# Patient Record
Sex: Male | Born: 1940 | Race: White | Hispanic: No | Marital: Married | Smoking: Never smoker
Health system: Southern US, Community
[De-identification: ages and names within clinical notes are randomized; demographics above are authoritative.]

## PROBLEM LIST (undated history)

## (undated) DIAGNOSIS — K219 Gastro-esophageal reflux disease without esophagitis: Secondary | ICD-10-CM

## (undated) DIAGNOSIS — I10 Essential (primary) hypertension: Secondary | ICD-10-CM

## (undated) DIAGNOSIS — D649 Anemia, unspecified: Secondary | ICD-10-CM

## (undated) DIAGNOSIS — M810 Age-related osteoporosis without current pathological fracture: Secondary | ICD-10-CM

## (undated) DIAGNOSIS — M1990 Unspecified osteoarthritis, unspecified site: Secondary | ICD-10-CM

## (undated) DIAGNOSIS — E559 Vitamin D deficiency, unspecified: Secondary | ICD-10-CM

## (undated) DIAGNOSIS — J45909 Unspecified asthma, uncomplicated: Secondary | ICD-10-CM

## (undated) DIAGNOSIS — M8580 Other specified disorders of bone density and structure, unspecified site: Secondary | ICD-10-CM

## (undated) HISTORY — PX: INGUINAL HERNIA REPAIR: SUR1180

## (undated) HISTORY — PX: HYDROCELE EXCISION: SHX482

---

## 2003-12-24 ENCOUNTER — Encounter: Payer: Self-pay | Admitting: Internal Medicine

## 2004-01-02 ENCOUNTER — Encounter: Payer: Self-pay | Admitting: Internal Medicine

## 2004-05-03 ENCOUNTER — Encounter: Payer: Self-pay | Admitting: Internal Medicine

## 2004-06-01 ENCOUNTER — Encounter: Payer: Self-pay | Admitting: Internal Medicine

## 2004-08-03 ENCOUNTER — Encounter: Payer: Self-pay | Admitting: Internal Medicine

## 2004-09-01 ENCOUNTER — Encounter: Payer: Self-pay | Admitting: Internal Medicine

## 2004-12-22 ENCOUNTER — Encounter: Payer: Self-pay | Admitting: Internal Medicine

## 2005-01-01 ENCOUNTER — Encounter: Payer: Self-pay | Admitting: Internal Medicine

## 2005-04-27 ENCOUNTER — Other Ambulatory Visit: Payer: Self-pay

## 2005-04-27 ENCOUNTER — Emergency Department: Payer: Self-pay | Admitting: Emergency Medicine

## 2005-08-02 ENCOUNTER — Encounter: Payer: Self-pay | Admitting: Internal Medicine

## 2005-09-01 ENCOUNTER — Encounter: Payer: Self-pay | Admitting: Internal Medicine

## 2006-04-19 ENCOUNTER — Encounter: Payer: Self-pay | Admitting: Internal Medicine

## 2006-05-02 ENCOUNTER — Encounter: Payer: Self-pay | Admitting: Internal Medicine

## 2007-06-05 ENCOUNTER — Encounter: Payer: Self-pay | Admitting: Internal Medicine

## 2007-07-02 ENCOUNTER — Encounter: Payer: Self-pay | Admitting: Internal Medicine

## 2007-12-22 ENCOUNTER — Encounter: Payer: Self-pay | Admitting: Internal Medicine

## 2008-01-02 ENCOUNTER — Encounter: Payer: Self-pay | Admitting: Internal Medicine

## 2008-06-04 ENCOUNTER — Ambulatory Visit: Payer: Self-pay | Admitting: Internal Medicine

## 2008-12-22 ENCOUNTER — Encounter: Payer: Self-pay | Admitting: Internal Medicine

## 2009-01-01 ENCOUNTER — Encounter: Payer: Self-pay | Admitting: Internal Medicine

## 2009-05-24 ENCOUNTER — Encounter: Payer: Self-pay | Admitting: Internal Medicine

## 2009-06-01 ENCOUNTER — Encounter: Payer: Self-pay | Admitting: Internal Medicine

## 2009-12-20 ENCOUNTER — Encounter: Payer: Self-pay | Admitting: Internal Medicine

## 2010-01-01 ENCOUNTER — Encounter: Payer: Self-pay | Admitting: Internal Medicine

## 2010-06-14 ENCOUNTER — Ambulatory Visit: Payer: Self-pay | Admitting: Internal Medicine

## 2010-12-12 ENCOUNTER — Ambulatory Visit: Payer: Self-pay | Admitting: Internal Medicine

## 2011-04-16 ENCOUNTER — Encounter: Payer: Self-pay | Admitting: Family Medicine

## 2011-04-16 LAB — FERRITIN: Ferritin (ARMC): 94 ng/mL (ref 8–388)

## 2011-05-02 ENCOUNTER — Encounter: Payer: Self-pay | Admitting: Family Medicine

## 2011-08-16 ENCOUNTER — Encounter: Payer: Self-pay | Admitting: Family Medicine

## 2011-08-16 LAB — HEMATOCRIT: HCT: 44.1 % (ref 40.0–52.0)

## 2011-09-02 ENCOUNTER — Encounter: Payer: Self-pay | Admitting: Family Medicine

## 2012-01-04 ENCOUNTER — Ambulatory Visit: Payer: Self-pay | Admitting: Family Medicine

## 2012-09-05 ENCOUNTER — Other Ambulatory Visit: Payer: Self-pay | Admitting: Family Medicine

## 2012-09-05 LAB — HEMATOCRIT: HCT: 42.2 % (ref 40.0–52.0)

## 2012-10-01 ENCOUNTER — Other Ambulatory Visit: Payer: Self-pay | Admitting: Family Medicine

## 2013-01-12 ENCOUNTER — Ambulatory Visit: Payer: Self-pay | Admitting: Gastroenterology

## 2013-01-12 HISTORY — PX: COLONOSCOPY: SHX174

## 2013-02-06 ENCOUNTER — Encounter: Payer: Self-pay | Admitting: Family Medicine

## 2013-02-06 LAB — HEMOGLOBIN: HGB: 15.1 g/dL (ref 13.0–18.0)

## 2013-03-01 ENCOUNTER — Encounter: Payer: Self-pay | Admitting: Family Medicine

## 2013-10-01 ENCOUNTER — Encounter: Payer: Self-pay | Admitting: Family Medicine

## 2013-10-01 LAB — CBC WITH DIFFERENTIAL/PLATELET
BASOS ABS: 0.1 10*3/uL (ref 0.0–0.1)
Basophil %: 1.1 %
EOS ABS: 0.2 10*3/uL (ref 0.0–0.7)
Eosinophil %: 2.3 %
HCT: 43.4 % (ref 40.0–52.0)
HGB: 15 g/dL (ref 13.0–18.0)
Lymphocyte #: 2 10*3/uL (ref 1.0–3.6)
Lymphocyte %: 29.8 %
MCH: 33.7 pg (ref 26.0–34.0)
MCHC: 34.5 g/dL (ref 32.0–36.0)
MCV: 98 fL (ref 80–100)
MONO ABS: 0.6 x10 3/mm (ref 0.2–1.0)
Monocyte %: 8.5 %
NEUTROS ABS: 4 10*3/uL (ref 1.4–6.5)
Neutrophil %: 58.3 %
PLATELETS: 295 10*3/uL (ref 150–440)
RBC: 4.45 10*6/uL (ref 4.40–5.90)
RDW: 12.5 % (ref 11.5–14.5)
WBC: 6.8 10*3/uL (ref 3.8–10.6)

## 2013-11-01 ENCOUNTER — Encounter: Payer: Self-pay | Admitting: Family Medicine

## 2014-03-15 ENCOUNTER — Encounter
Admit: 2014-03-15 | Disposition: A | Discharge status: Home / Self Care | Payer: Self-pay | Attending: Family Medicine | Admitting: Family Medicine

## 2014-04-02 ENCOUNTER — Encounter
Admit: 2014-04-02 | Disposition: A | Discharge status: Home / Self Care | Payer: Self-pay | Attending: Family Medicine | Admitting: Family Medicine

## 2015-12-01 DIAGNOSIS — Z0000 Encounter for general adult medical examination without abnormal findings: Secondary | ICD-10-CM | POA: Insufficient documentation

## 2015-12-01 DIAGNOSIS — Z7185 Encounter for immunization safety counseling: Secondary | ICD-10-CM | POA: Insufficient documentation

## 2017-09-11 DIAGNOSIS — M8589 Other specified disorders of bone density and structure, multiple sites: Secondary | ICD-10-CM | POA: Insufficient documentation

## 2018-10-14 ENCOUNTER — Other Ambulatory Visit: Payer: Self-pay

## 2018-10-14 DIAGNOSIS — Z20822 Contact with and (suspected) exposure to covid-19: Secondary | ICD-10-CM

## 2018-10-16 LAB — NOVEL CORONAVIRUS, NAA: SARS-CoV-2, NAA: NOT DETECTED

## 2019-02-20 DIAGNOSIS — M47812 Spondylosis without myelopathy or radiculopathy, cervical region: Secondary | ICD-10-CM | POA: Insufficient documentation

## 2019-10-15 ENCOUNTER — Other Ambulatory Visit (HOSPITAL_COMMUNITY): Payer: Self-pay | Admitting: Family Medicine

## 2019-10-15 ENCOUNTER — Other Ambulatory Visit: Payer: Self-pay | Admitting: Family Medicine

## 2019-10-15 DIAGNOSIS — M5442 Lumbago with sciatica, left side: Secondary | ICD-10-CM

## 2019-10-15 DIAGNOSIS — M5441 Lumbago with sciatica, right side: Secondary | ICD-10-CM

## 2019-10-20 ENCOUNTER — Ambulatory Visit
Admission: RE | Admit: 2019-10-20 | Discharge: 2019-10-20 | Disposition: A | Discharge status: Home / Self Care | Payer: Medicare PPO | Source: Ambulatory Visit | Attending: Family Medicine | Admitting: Family Medicine

## 2019-10-20 ENCOUNTER — Other Ambulatory Visit: Payer: Self-pay

## 2019-10-20 DIAGNOSIS — M5441 Lumbago with sciatica, right side: Secondary | ICD-10-CM

## 2019-10-20 DIAGNOSIS — M5442 Lumbago with sciatica, left side: Secondary | ICD-10-CM | POA: Insufficient documentation

## 2019-10-21 DIAGNOSIS — M48062 Spinal stenosis, lumbar region with neurogenic claudication: Secondary | ICD-10-CM | POA: Insufficient documentation

## 2019-10-31 ENCOUNTER — Ambulatory Visit: Payer: Self-pay

## 2019-11-18 ENCOUNTER — Other Ambulatory Visit: Payer: Self-pay | Admitting: Neurosurgery

## 2019-11-18 DIAGNOSIS — G959 Disease of spinal cord, unspecified: Secondary | ICD-10-CM

## 2019-12-03 ENCOUNTER — Other Ambulatory Visit: Payer: Self-pay

## 2019-12-03 ENCOUNTER — Ambulatory Visit
Admission: RE | Admit: 2019-12-03 | Discharge: 2019-12-03 | Disposition: A | Discharge status: Home / Self Care | Payer: Medicare PPO | Source: Ambulatory Visit | Attending: Neurosurgery | Admitting: Neurosurgery

## 2019-12-03 DIAGNOSIS — G959 Disease of spinal cord, unspecified: Secondary | ICD-10-CM | POA: Diagnosis present

## 2019-12-03 MED ORDER — GADOBUTROL 1 MMOL/ML IV SOLN
6.0000 mL | Freq: Once | INTRAVENOUS | Status: AC | PRN
  Administered 2019-12-03: 6 mL via INTRAVENOUS

## 2019-12-20 DIAGNOSIS — G4700 Insomnia, unspecified: Secondary | ICD-10-CM | POA: Insufficient documentation

## 2019-12-20 DIAGNOSIS — N529 Male erectile dysfunction, unspecified: Secondary | ICD-10-CM | POA: Insufficient documentation

## 2019-12-20 DIAGNOSIS — K219 Gastro-esophageal reflux disease without esophagitis: Secondary | ICD-10-CM | POA: Insufficient documentation

## 2019-12-20 DIAGNOSIS — I10 Essential (primary) hypertension: Secondary | ICD-10-CM | POA: Insufficient documentation

## 2019-12-20 DIAGNOSIS — M8580 Other specified disorders of bone density and structure, unspecified site: Secondary | ICD-10-CM | POA: Insufficient documentation

## 2019-12-22 ENCOUNTER — Ambulatory Visit: Payer: Medicare PPO | Attending: Pain Medicine | Admitting: Pain Medicine

## 2019-12-22 ENCOUNTER — Encounter: Payer: Self-pay | Admitting: Pain Medicine

## 2019-12-22 ENCOUNTER — Other Ambulatory Visit: Payer: Self-pay

## 2019-12-22 VITALS — BP 154/73 | HR 96 | Temp 97.7°F | Resp 16 | Ht 64.0 in | Wt 142.0 lb

## 2019-12-22 DIAGNOSIS — Z789 Other specified health status: Secondary | ICD-10-CM | POA: Diagnosis present

## 2019-12-22 DIAGNOSIS — M5137 Other intervertebral disc degeneration, lumbosacral region: Secondary | ICD-10-CM | POA: Insufficient documentation

## 2019-12-22 DIAGNOSIS — M48062 Spinal stenosis, lumbar region with neurogenic claudication: Secondary | ICD-10-CM

## 2019-12-22 DIAGNOSIS — M47816 Spondylosis without myelopathy or radiculopathy, lumbar region: Secondary | ICD-10-CM | POA: Diagnosis present

## 2019-12-22 DIAGNOSIS — M5441 Lumbago with sciatica, right side: Secondary | ICD-10-CM | POA: Insufficient documentation

## 2019-12-22 DIAGNOSIS — M899 Disorder of bone, unspecified: Secondary | ICD-10-CM

## 2019-12-22 DIAGNOSIS — M4310 Spondylolisthesis, site unspecified: Secondary | ICD-10-CM

## 2019-12-22 DIAGNOSIS — M5030 Other cervical disc degeneration, unspecified cervical region: Secondary | ICD-10-CM | POA: Diagnosis present

## 2019-12-22 DIAGNOSIS — R937 Abnormal findings on diagnostic imaging of other parts of musculoskeletal system: Secondary | ICD-10-CM

## 2019-12-22 DIAGNOSIS — S22080S Wedge compression fracture of T11-T12 vertebra, sequela: Secondary | ICD-10-CM

## 2019-12-22 DIAGNOSIS — G894 Chronic pain syndrome: Secondary | ICD-10-CM

## 2019-12-22 DIAGNOSIS — M79604 Pain in right leg: Secondary | ICD-10-CM | POA: Insufficient documentation

## 2019-12-22 DIAGNOSIS — M4807 Spinal stenosis, lumbosacral region: Secondary | ICD-10-CM | POA: Diagnosis present

## 2019-12-22 DIAGNOSIS — F1120 Opioid dependence, uncomplicated: Secondary | ICD-10-CM | POA: Diagnosis present

## 2019-12-22 DIAGNOSIS — M542 Cervicalgia: Secondary | ICD-10-CM

## 2019-12-22 DIAGNOSIS — M47812 Spondylosis without myelopathy or radiculopathy, cervical region: Secondary | ICD-10-CM

## 2019-12-22 DIAGNOSIS — M51379 Other intervertebral disc degeneration, lumbosacral region without mention of lumbar back pain or lower extremity pain: Secondary | ICD-10-CM

## 2019-12-22 DIAGNOSIS — M4316 Spondylolisthesis, lumbar region: Secondary | ICD-10-CM | POA: Diagnosis present

## 2019-12-22 DIAGNOSIS — Z79899 Other long term (current) drug therapy: Secondary | ICD-10-CM

## 2019-12-22 DIAGNOSIS — G8929 Other chronic pain: Secondary | ICD-10-CM

## 2019-12-22 NOTE — Progress Notes (Signed)
Safety precautions to be maintained throughout the outpatient stay will include: orient to surroundings, keep bed in low position, maintain call bell within reach at all times, provide assistance with transfer out of bed and ambulation.  

## 2019-12-22 NOTE — Progress Notes (Deleted)
PROVIDER NOTE: Information contained herein reflects review and annotations entered in association with encounter. Interpretation of such information and data should be left to medically-trained personnel. Information provided to patient can be located elsewhere in the medical record under "Patient Instructions". Document created using STT-dictation technology, any transcriptional errors that may result from process are unintentional.    Patient: Carlos Neal.  Service: Procedure (FAST-TRACK REFERRAL)  Provider: Gaspar Cola, MD  DOB: [birth year]  DOS: 12/22/2019  Specialty: Interventional Pain Management  MRN: [id number]  Setting: Ambulatory outpatient  Referring Provider: Dion Body, MD  Type: New Patient  Location: Pain clinic facility  office  PCP: Dion Body, MD  NOTE: FAST-TRACK referrals are offered to accelerate the care of patients in the need of specific interventional pain management therapies. This type of referral does not include non-interventional pain management options.   ***Procedure  Primary Reason for Visit: Interventional Pain Management Treatment. CC: Back Pain (lower)  HPI  Carlos Neal is a 79 y.o. year old, male patient, who comes today for a  "Fast-Track" new patient evaluation, as requested by Dion Body, MD. The patient has been made aware that this type of referral option is reserved for the Interventional Pain Management portion of our practice and completely excludes the option of medication management. His primarily concern today is the Back Pain (lower)  Pain Assessment: Location: Lower Back Radiating: right upper leg Onset: More than a month ago Duration: Chronic pain Quality: Burning Severity: 10-Worst pain ever/10 (subjective, self-reported pain score)  Effect on ADL: difficulty walking Timing: Intermittent Modifying factors: sitting, heat, Oxycodone BP: (!) 154/73  HR: 96  Onset and Duration: Present longer than 3  months Cause of pain: exercise Severity: No change since onset, NAS-11 at its worse: 10/10, NAS-11 now: 3/10 and NAS-11 on the average: 3/10 Timing: Not influenced by the time of the day Aggravating Factors: Bowel movements, Kneeling, Lifiting, Prolonged standing, Twisting and Walking uphill Alleviating Factors: Hot packs, Medications, Resting, TENS and physical therapy Associated Problems: Constipation, Night-time cramps and Pain that wakes patient up Quality of Pain: Agonizing, Burning, Hot and Sharp Previous Examinations or Tests: MRI scan and X-rays Previous Treatments: Physical Therapy, Relaxation therapy and TENS  The patient comes into the clinics today, referred to [place] for a ***  Meds   Current Outpatient Medications:  .  Acetaminophen (TYLENOL PO), Take 1,000 mg by mouth 3 (three) times daily as needed., Disp: , Rfl:  .  ASPIRIN 81 PO, Take by mouth daily., Disp: , Rfl:  .  brimonidine (ALPHAGAN P) 0.1 % SOLN, 1 drop in the morning and at bedtime., Disp: , Rfl:  .  Calcium Carbonate-Vit D-Min (CALTRATE 600+D PLUS MINERALS) 600-800 MG-UNIT TABS, Take by mouth daily., Disp: , Rfl:  .  diclofenac Sodium (VOLTAREN) 1 % GEL, Apply topically as needed., Disp: , Rfl:  .  gabapentin (NEURONTIN) 300 MG capsule, Take 300 mg by mouth at bedtime., Disp: , Rfl:  .  hydrochlorothiazide (HYDRODIURIL) 25 MG tablet, Take 25 mg by mouth daily., Disp: , Rfl:  .  ibuprofen (ADVIL) 800 MG tablet, Take 800 mg by mouth every 8 (eight) hours as needed., Disp: , Rfl:  .  losartan (COZAAR) 25 MG tablet, Take 25 mg by mouth daily., Disp: , Rfl:  .  Multiple Vitamins-Minerals (PRESERVISION AREDS PO), Take by mouth daily., Disp: , Rfl:  .  naproxen sodium (ALEVE) 220 MG tablet, Take 220 mg by mouth 2 (two) times daily as needed., Disp: ,  Rfl:  .  omeprazole (PRILOSEC) 20 MG capsule, Take 20 mg by mouth daily., Disp: , Rfl:  .  oxyCODONE (ROXICODONE) 5 MG immediate release tablet, Take 5 mg by mouth 2 (two)  times daily., Disp: , Rfl:  .  RIBOFLAVIN PO, Take by mouth daily., Disp: , Rfl:  .  traZODone (DESYREL) 50 MG tablet, Take 50 mg by mouth at bedtime as needed for sleep., Disp: , Rfl:   Imaging Review  Cervical Imaging: Cervical MR wo contrast: No results found for this or any previous visit.  Cervical MR wo contrast: No valid procedures specified. Cervical MR w/wo contrast: No results found for this or any previous visit.  Cervical MR w contrast: No results found for this or any previous visit.  Cervical CT wo contrast: No results found for this or any previous visit.  Cervical CT w/wo contrast: No results found for this or any previous visit.  Cervical CT w/wo contrast: No results found for this or any previous visit.  Cervical CT w contrast: No results found for this or any previous visit.  Cervical CT outside: No results found for this or any previous visit.  Cervical DG 1 view: No results found for this or any previous visit.  Cervical DG 2-3 views: No results found for this or any previous visit.  Cervical DG F/E views: No results found for this or any previous visit.  Cervical DG 2-3 clearing views: No results found for this or any previous visit.  Cervical DG Bending/F/E views: No results found for this or any previous visit.  Cervical DG complete: No results found for this or any previous visit.  Cervical DG Myelogram views: No results found for this or any previous visit.  Cervical DG Myelogram views: No results found for this or any previous visit.  Cervical Discogram views: No results found for this or any previous visit.   Shoulder Imaging: Shoulder-R MR w contrast: No results found for this or any previous visit.  Shoulder-L MR w contrast: No results found for this or any previous visit.  Shoulder-R MR w/wo contrast: No results found for this or any previous visit.  Shoulder-L MR w/wo contrast: No results found for this or any previous  visit.  Shoulder-R MR wo contrast: No results found for this or any previous visit.  Shoulder-L MR wo contrast: No results found for this or any previous visit.  Shoulder-R CT w contrast: No results found for this or any previous visit.  Shoulder-L CT w contrast: No results found for this or any previous visit.  Shoulder-R CT w/wo contrast: No results found for this or any previous visit.  Shoulder-L CT w/wo contrast: No results found for this or any previous visit.  Shoulder-R CT wo contrast: No results found for this or any previous visit.  Shoulder-L CT wo contrast: No results found for this or any previous visit.  Shoulder-R DG Arthrogram: No results found for this or any previous visit.  Shoulder-L DG Arthrogram: No results found for this or any previous visit.  Shoulder-R DG 1 view: No results found for this or any previous visit.  Shoulder-L DG 1 view: No results found for this or any previous visit.  Shoulder-R DG: No results found for this or any previous visit.  Shoulder-L DG: No results found for this or any previous visit.   Thoracic Imaging: Thoracic MR wo contrast: No results found for this or any previous visit.  Thoracic MR wo contrast: No valid procedures  specified. Thoracic MR w/wo contrast: No results found for this or any previous visit.  Thoracic MR w contrast: No results found for this or any previous visit.  Thoracic CT wo contrast: No results found for this or any previous visit.  Thoracic CT w/wo contrast: No results found for this or any previous visit.  Thoracic CT w/wo contrast: No results found for this or any previous visit.  Thoracic CT w contrast: No results found for this or any previous visit.  Thoracic DG 2-3 views: No results found for this or any previous visit.  Thoracic DG 4 views: No results found for this or any previous visit.  Thoracic DG: No results found for this or any previous visit.  Thoracic DG w/swimmers view: No  results found for this or any previous visit.  Thoracic DG Myelogram views: No results found for this or any previous visit.  Thoracic DG Myelogram views: No results found for this or any previous visit.   Lumbosacral Imaging: Lumbar MR wo contrast: Results for orders placed during the hospital encounter of 10/20/19  MR LUMBAR SPINE WO CONTRAST  Narrative CLINICAL DATA:  Acute bilateral low back pain with bilateral sciatica. Additional history provided by technologist: Patient started [employer] 6 months ago, bilateral posterior hip pain.  EXAM: MRI LUMBAR SPINE WITHOUT CONTRAST  TECHNIQUE: Multiplanar, multisequence MR imaging of the lumbar spine was performed. No intravenous contrast was administered.  COMPARISON:  Report from lumbar spine MRI 10/02/2019 (images unavailable).  FINDINGS: Segmentation: For the purposes of this dictation, five lumbar vertebrae are assumed and the caudal most well-formed intervertebral disc is designated L5-S1.  Alignment: S shaped scoliosis. T12-L1 and L1-L2 grade 1 retrolisthesis. L4-L5 grade 1 anterolisthesis.  Vertebrae: Mild chronic anterior wedging of the T12 vertebral body. Vertebral body height is otherwise maintained. Multilevel degenerative endplate irregularity with mixed degenerative endplate marrow signal. Schmorl nodes within the L1 and L3 superior endplates. There is prominent endplate edema at Z6-X0, also extending into the right-sided pedicles at these levels. Additional extensive T2/STIR hyperintense signal abnormality throughout much of the L2 vertebral body. Prominent multilevel ventrolateral osteophytes.  Conus medullaris and cauda equina: Conus extends to the T12-L1 level. No signal abnormality within the visualized distal spinal cord.  Paraspinal and other soft tissues: Atrophy of the left psoas muscle (series 10, image 16). No other abnormality is identified within included portions of the  abdomen/retroperitoneum. Atrophy of the lumbar paraspinal musculature.  Disc levels:  Multilevel disc degeneration most notably as follows. There is advanced disc degeneration on the right at T12-L1, L1-L2 and L2-L3. Moderate/advanced disc degeneration at L4-L5.  T11-T12: This level is imaged sagittally. Disc bulge. Superimposed central disc protrusion. The disc protrusion contributes to mild spinal canal stenosis, contacting and mildly flattening the distal spinal cord. No significant foraminal stenosis.  T12-L1: Grade 1 retrolisthesis. Disc bulge asymmetric to the right. Osteophyte ridge greatest along the right lateral aspect of the disc space. Facet arthrosis. The disc bulge contributes to mild spinal canal stenosis, contacting multiple descending nerve roots within the ventral spinal canal. No significant foraminal stenosis.  L1-L2: Grade 1 retrolisthesis. Disc bulge. Prominent osteophyte ridge along the right ventrolateral aspect of the disc space. Facet arthrosis. No significant spinal canal stenosis. Moderate right neural foraminal narrowing. Additionally, disc osteophyte ridge may contact the exiting right L1 nerve root beyond the right neural foramen.  L2-L3: Large irregular disc bulge with circumferential osteophyte ridge. Advanced facet arthrosis with ligamentum flavum hypertrophy. Severe spinal canal stenosis with likely  impingement of the descending cauda equina nerve roots (series 5, image 9) (series 10, image 14). Bilateral neural foraminal narrowing (moderate right, mild left).  L3-L4: Disc bulge. Moderate/advanced facet arthrosis with ligamentum flavum hypertrophy. Mild bilateral subarticular narrowing without frank nerve root impingement. Central canal patent. Borderline mild right neural foraminal narrowing. Moderate left neural foraminal narrowing.  L4-L5: Grade 1 anterolisthesis. Grade 1 anterolisthesis. Disc uncovering with disc bulge. Osteophyte ridge  greatest along left lateral aspect of the disc space. Advanced facet arthrosis with ligamentum flavum hypertrophy. Mild bilateral subarticular narrowing without nerve root impingement. Central canal patent. Moderate right neural foraminal narrowing. Borderline mild left neural foraminal narrowing.  L5-S1: No significant disc herniation. Moderate/advanced facet arthrosis. No significant spinal canal stenosis. Mild bilateral neural foraminal narrowing.  Impression #1 and #3 will be called to the ordering clinician or representative by the Radiologist Assistant, and communication documented in the PACS or [employer].  IMPRESSION: At L2-L3, there is prominent endplate edema in the presence of severe disc degeneration. There is additional T2/STIR hyperintense signal abnormality throughout much of the L2 vertebral body demonstrating a somewhat rounded appearance. A component of this signal abnormality is almost certainly degenerative. However, the extent and appearance of the signal abnormality within the L2 vertebral body raises the possibility of an underlying lesion (i.e. atypical hemangioma or metastasis). Additionally, clinical correlation is recommended to exclude signs or symptoms of infection. MRI follow-up is recommended for surveillance of these findings.  Lumbar spondylosis as outlined with findings most notably as follows.  At L2-L3, there is severe disc degeneration. Multifactorial severe spinal canal stenosis with likely cauda equina nerve root impingement. Bilateral neural foraminal narrowing (moderate right, mild left).  No more than mild spinal canal stenosis at the remaining levels. Of note, a T11-T12 disc bulge contacts and mildly flattens the ventral spinal cord. A T12-L1, a disc bulge contacts the descending cauda equina nerve roots within the ventral aspect of the spinal canal. 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  Additional levels of facet arthrosis (including levels of advanced facet arthrosis) as above.  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   Electronically Signed By: Kellie Simmering DO On: 10/20/2019 17:06  Lumbar MR wo contrast: No valid procedures specified. Lumbar MR w/wo contrast: Results for orders placed during the hospital encounter of 12/03/19  MR Lumbar Spine W Wo Contrast  Narrative CLINICAL DATA:  Low back pain with pain and burning in the right leg.  EXAM: MRI LUMBAR SPINE WITHOUT AND WITH CONTRAST  TECHNIQUE: Multiplanar and multiecho pulse sequences of the lumbar spine were obtained without and with intravenous contrast.  CONTRAST:  32m GADAVIST GADOBUTROL 1 MMOL/ML IV SOLN  COMPARISON:  10/20/2019  FINDINGS: Segmentation:  Standard lumbar numbering  Alignment: Exaggerated lumbar lordosis with L4-5 anterolisthesis. There is retrolisthesis at T12-L1 and L1-2 exaggerated kyphosis at the thoracolumbar junction.  Vertebrae: Discogenic endplate edema at LQ3-3 no fracture or suspected bone lesion.  Conus medullaris and cauda equina: Conus extends to the T12 level. Conus appears normal. There is nerve root redundancy below L2-3 due to the severity of spinal stenosis.  Paraspinal and other soft tissues: Left far-lateral to L3 is a 14 mm cystic structure with internal debris like appearance, new/progressive from before and contiguous with a left far-lateral extrude.  Disc levels:  T12- L1: Anterior disc narrowing and spurring. Small central disc protrusion. No neural  compression.  L1-L2: Asymmetric rightward disc collapse with endplate spurring. Asymmetric right facet spurring. Right foraminal stenosis is mild or moderate.  L2-L3: Disc collapse and endplate degeneration with discogenic edema. Facet osteoarthritis with asymmetric  left-sided spurring. Severe spinal stenosis. Biforaminal impingement and marked left far-lateral impingement due to a extrusion with adjacent cyst that is also mentioned above.  L3-L4: Disc narrowing and bulging. Facet osteoarthritis and ligamentum flavum thickening with interspinous ganglion. No neural compression.  L4-L5: Facet osteoarthritis with spurring and anterolisthesis. Disc narrowing asymmetric to the left where there is greater bulging and ridging. Moderate left foraminal stenosis. Patent canal  L5-S1:Facet osteoarthritis with moderate spurring. Negative disc space. No neural impingement.  IMPRESSION: 1. Advanced disc and facet degeneration with exaggerated lumbar lordosis and L4-5 anterolisthesis. 2. Dominant findings at L2-3 where there is advanced spinal stenosis and biforaminal impingement. Also at this level is a left far-lateral extrusion with adjacent 14 mm cyst that has developed/progressed from comparison 10/20/2019, presumably disc. Marrow signal abnormality at this level is attributed to discogenic inflammation; no evident bone lesion. 3. L4-5 moderate left foraminal stenosis.   Electronically Signed By: Monte Fantasia M.D. On: 12/04/2019 09:14  Lumbar MR w/wo contrast: No results found for this or any previous visit.  Lumbar MR w contrast: No results found for this or any previous visit.  Lumbar CT wo contrast: No results found for this or any previous visit.  Lumbar CT w/wo contrast: No results found for this or any previous visit.  Lumbar CT w/wo contrast: No results found for this or any previous visit.  Lumbar CT w contrast: No results found for this or any previous visit.  Lumbar DG 1V: No results found for this or any previous visit.  Lumbar DG 1V (Clearing): No results found for this or any previous visit.  Lumbar DG 2-3V (Clearing): No results found for this or any previous visit.  Lumbar DG 2-3 views: No results found for this or any  previous visit.  Lumbar DG (Complete) 4+V: No results found for this or any previous visit.        Lumbar DG F/E views: No results found for this or any previous visit.        Lumbar DG Bending views: No results found for this or any previous visit.        Lumbar DG Myelogram views: No results found for this or any previous visit.  Lumbar DG Myelogram: No results found for this or any previous visit.  Lumbar DG Myelogram: No results found for this or any previous visit.  Lumbar DG Myelogram: No results found for this or any previous visit.  Lumbar DG Myelogram Lumbosacral: No results found for this or any previous visit.  Lumbar DG Diskogram views: No results found for this or any previous visit.  Lumbar DG Diskogram views: No results found for this or any previous visit.  Lumbar DG Epidurogram OP: No results found for this or any previous visit.  Lumbar DG Epidurogram IP: No results found for this or any previous visit.   Sacroiliac Joint Imaging: Sacroiliac Joint DG: No results found for this or any previous visit.  Sacroiliac Joint MR w/wo contrast: No results found for this or any previous visit.  Sacroiliac Joint MR wo contrast: No results found for this or any previous visit.   Spine Imaging: Whole Spine DG Myelogram views: No results found for this or any previous visit.  Whole Spine MR Mets screen: No results found for this  or any previous visit.  Whole Spine MR Mets screen: No results found for this or any previous visit.  Whole Spine MR w/wo: No results found for this or any previous visit.  MRA Spinal Canal w/ cm: No results found for this or any previous visit.  MRA Spinal Canal wo/ cm: No valid procedures specified. MRA Spinal Canal w/wo cm: No results found for this or any previous visit.  Spine Outside MR Films: No results found for this or any previous visit.  Spine Outside CT Films: No results found for this or any previous visit.  CT-Guided  Biopsy: No results found for this or any previous visit.  CT-Guided Needle Placement: No results found for this or any previous visit.  DG Spine outside: No results found for this or any previous visit.  IR Spine outside: No results found for this or any previous visit.  NM Spine outside: No results found for this or any previous visit.   Hip Imaging: Hip-R MR w contrast: No results found for this or any previous visit.  Hip-L MR w contrast: No results found for this or any previous visit.  Hip-R MR w/wo contrast: No results found for this or any previous visit.  Hip-L MR w/wo contrast: No results found for this or any previous visit.  Hip-R MR wo contrast: No results found for this or any previous visit.  Hip-L MR wo contrast: No results found for this or any previous visit.  Hip-R CT w contrast: No results found for this or any previous visit.  Hip-L CT w contrast: No results found for this or any previous visit.  Hip-R CT w/wo contrast: No results found for this or any previous visit.  Hip-L CT w/wo contrast: No results found for this or any previous visit.  Hip-R CT wo contrast: No results found for this or any previous visit.  Hip-L CT wo contrast: No results found for this or any previous visit.  Hip-R DG 2-3 views: No results found for this or any previous visit.  Hip-L DG 2-3 views: No results found for this or any previous visit.  Hip-R DG Arthrogram: No results found for this or any previous visit.  Hip-L DG Arthrogram: No results found for this or any previous visit.  Hip-B DG Bilateral: No results found for this or any previous visit.   Knee Imaging: Knee-R MR w contrast: No results found for this or any previous visit.  Knee-L MR w contrast: No results found for this or any previous visit.  Knee-R MR w/wo contrast: No results found for this or any previous visit.  Knee-L MR w/wo contrast: No results found for this or any previous visit.  Knee-R MR  wo contrast: No results found for this or any previous visit.  Knee-L MR wo contrast: No results found for this or any previous visit.  Knee-R CT w contrast: No results found for this or any previous visit.  Knee-L CT w contrast: No results found for this or any previous visit.  Knee-R CT w/wo contrast: No results found for this or any previous visit.  Knee-L CT w/wo contrast: No results found for this or any previous visit.  Knee-R CT wo contrast: No results found for this or any previous visit.  Knee-L CT wo contrast: No results found for this or any previous visit.  Knee-R DG 1-2 views: No results found for this or any previous visit.  Knee-L DG 1-2 views: No results found for this or  any previous visit.  Knee-R DG 3 views: No results found for this or any previous visit.  Knee-L DG 3 views: No results found for this or any previous visit.  Knee-R DG 4 views: No results found for this or any previous visit.  Knee-L DG 4 views: No results found for this or any previous visit.  Knee-R DG Arthrogram: No results found for this or any previous visit.  Knee-L DG Arthrogram: No results found for this or any previous visit.   Ankle Imaging: Ankle-R DG Complete: No results found for this or any previous visit.  Ankle-L DG Complete: No results found for this or any previous visit.   Foot Imaging: Foot-R DG Complete: No results found for this or any previous visit.  Foot-L DG Complete: No results found for this or any previous visit.   Elbow Imaging: Elbow-R DG Complete: No results found for this or any previous visit.  Elbow-L DG Complete: No results found for this or any previous visit.   Wrist Imaging: Wrist-R DG Complete: No results found for this or any previous visit.  Wrist-L DG Complete: No results found for this or any previous visit.   Hand Imaging: Hand-R DG Complete: No results found for this or any previous visit.  Hand-L DG Complete: No results found  for this or any previous visit.   Complexity Note: Imaging results reviewed. Results shared with Mr. Mclees, using Layman's terms.                         ROS  Cardiovascular: High blood pressure Pulmonary or Respiratory: Wheezing and difficulty taking a deep full breath (Asthma) and Snoring  Neurological: Curved spine Psychological-Psychiatric: No reported psychological or psychiatric signs or symptoms such as difficulty sleeping, anxiety, depression, delusions or hallucinations (schizophrenial), mood swings (bipolar disorders) or suicidal ideations or attempts Gastrointestinal: Reflux or heatburn Genitourinary: No reported renal or genitourinary signs or symptoms such as difficulty voiding or producing urine, peeing blood, non-functioning kidney, kidney stones, difficulty emptying the bladder, difficulty controlling the flow of urine, or chronic kidney disease Hematological: No reported hematological signs or symptoms such as prolonged bleeding, low or poor functioning platelets, bruising or bleeding easily, hereditary bleeding problems, low energy levels due to low hemoglobin or being anemic Endocrine: No reported endocrine signs or symptoms such as high or low blood sugar, rapid heart rate due to high thyroid levels, obesity or weight gain due to slow thyroid or thyroid disease Rheumatologic: Joint aches and or swelling due to excess weight (Osteoarthritis) Musculoskeletal: Negative for myasthenia gravis, muscular dystrophy, multiple sclerosis or malignant hyperthermia Work History: Retired  Allergies  Mr. Aungst has No Known Allergies.  Laboratory Chemistry Profile   Renal No results found for: BUN, CREATININE, LABCREA, BCR, GFR, GFRAA, GFRNONAA, SPECGRAV, PHUR, PROTEINUR   Electrolytes No results found for: NA, K, CL, CALCIUM, MG, PHOS   Hepatic No results found for: AST, ALT, ALBUMIN, ALKPHOS, AMYLASE, LIPASE, AMMONIA   ID Lab Results  Component Value Date   SARSCOV2NAA Not  Detected 10/14/2018     Bone No results found for: VD25OH, [id number], [id number], [id number], 25OHVITD1, 25OHVITD2, 25OHVITD3, TESTOFREE, TESTOSTERONE   Endocrine No results found for: GLUCOSE, GLUCOSEU, HGBA1C, TSH, FREET4, TESTOFREE, TESTOSTERONE, SHBG, ESTRADIOL, ESTRADIOLPCT, ESTRADIOLFRE, LABPREG, ACTH, CRTSLPL, UCORFRPERLTR, UCORFRPERDAY, CORTISOLBASE, LABPREG   Neuropathy No results found for: VITAMINB12, FOLATE, HGBA1C, HIV   CNS No results found for: COLORCSF, APPEARCSF, RBCCOUNTCSF, WBCCSF, POLYSCSF, LYMPHSCSF, EOSCSF, PROTEINCSF, GLUCCSF, JCVIRUS, CSFOLI, IGGCSF,  LABACHR, ACETBL, LABACHR, ACETBL   Inflammation (CRP: Acute  ESR: Chronic) No results found for: CRP, ESRSEDRATE, LATICACIDVEN   Rheumatology No results found for: RF, ANA, LABURIC, URICUR, LYMEIGGIGMAB, LYMEABIGMQN, HLAB27   Coagulation Lab Results  Component Value Date   PLT 295 10/01/2013     Cardiovascular Lab Results  Component Value Date   HGB 15.0 10/01/2013   HCT 43.4 10/01/2013     Screening Lab Results  Component Value Date   SARSCOV2NAA Not Detected 10/14/2018     Cancer No results found for: CEA, CA125, LABCA2   Allergens No results found for: ALMOND, APPLE, ASPARAGUS, AVOCADO, BANANA, BARLEY, BASIL, BAYLEAF, GREENBEAN, LIMABEAN, WHITEBEAN, BEEFIGE, REDBEET, BLUEBERRY, BROCCOLI, CABBAGE, MELON, CARROT, CASEIN, CASHEWNUT, CAULIFLOWER, CELERY     Note: Lab results reviewed.  PFSH  Drug: Mr. Gibeault  has no history on file for drug use. Alcohol:  reports current alcohol use. Tobacco:  reports that he has never smoked. He has never used smokeless tobacco. Medical:  has no past medical history on file. Family: family history is not on file.  *** The histories are not reviewed yet. Please review them in the "History" navigator section and refresh this [place]. Active Ambulatory Problems    Diagnosis Date Noted  . Encounter for general adult medical examination without abnormal findings  12/01/2015  . Essential hypertension 12/20/2019  . GERD without esophagitis 12/20/2019  . 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 Impotence 12/20/2019  . Insomnia 12/20/2019  . Osteopenia 12/20/2019  . Osteopenia of multiple sites 09/11/2017  . 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 Vaccine counseling 12/01/2015  . Chronic pain syndrome 12/23/2019  . Pharmacologic therapy 12/23/2019  . Disorder of skeletal system 12/23/2019  . Problems influencing health status 12/23/2019  . 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[place]) 12/23/2019  . Chronic lower extremity pain (1ry area of Pain) (Right) 12/23/2019  . 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 Abnormal MRI, lumbar spine (12/04/2019) 12/23/2019  . 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 Retrolisthesis (T12-L1, L1-2) 12/23/2019  . Lumbosacral foraminal stenosis 12/23/2019  . Lumbar facet arthropathy 12/23/2019  . 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 Lumbar facet syndrome (Bilateral) 12/23/2019  . Wedge compression fracture of T12 vertebra, sequela 12/23/2019  . DDD (degenerative disc disease), lumbosacral 12/23/2019   Resolved Ambulatory Problems    Diagnosis Date Noted  . No Resolved Ambulatory Problems   No Additional Past Medical History   Constitutional Exam  General appearance: Well nourished, well developed, and well hydrated. In no apparent acute distress Vitals:   12/22/19 1436  BP: (!) 154/73  Pulse: 96  Resp: 16  Temp: 97.7 F (36.5 C)  TempSrc: Temporal  SpO2: 99%  Weight: 142 lb (64.4 kg)  Height: 5' 4" (1.626 m)   BMI Assessment: Estimated body mass index is 24.37 kg/m as calculated from the following:   Height as of this encounter: 5' 4" (1.626 m).   Weight as of this encounter: 142 lb (64.4  kg).  BMI interpretation table: BMI level Category Range association with higher incidence of chronic pain  <18 kg/m2 Underweight   18.5-24.9 kg/m2 Ideal body weight   25-29.9 kg/m2 Overweight Increased incidence by 20%  30-34.9 kg/m2 Obese (Class I) Increased incidence by 68%  35-39.9 kg/m2 Severe obesity (Class II) Increased incidence by 136%  >40 kg/m2 Extreme  obesity (Class III) Increased incidence by 254%   Patient's current BMI Ideal Body weight  Body mass index is 24.37 kg/m. Ideal body weight: 59.2 kg (130 lb 8.2 oz) Adjusted ideal body weight: 61.3 kg (135 lb 1.7 oz)   BMI Readings from Last 4 Encounters:  12/22/19 24.37 kg/m   Wt Readings from Last 4 Encounters:  12/22/19 142 lb (64.4 kg)    Psych/Mental status: Alert, oriented x 3 (person, place, & time)       Eyes: PERLA Respiratory: No evidence of acute respiratory distress  Lumbar Exam  Skin & Axial Inspection: No masses, redness, or swelling Alignment: Symmetrical Functional ROM: Unrestricted ROM       Stability: No instability detected Muscle Tone/Strength: Functionally intact. No obvious neuro-muscular anomalies detected. Sensory (Neurological): Unimpaired Palpation: No palpable anomalies       Provocative Tests: Hyperextension/rotation test: deferred today       Lumbar quadrant test (Kemp's test): deferred today       Lateral bending test: deferred today       Patrick's Maneuver: deferred today                   FABER* test: deferred today                   S-I anterior distraction/compression test: deferred today         S-I lateral compression test: deferred today         S-I Thigh-thrust test: deferred today         S-I Gaenslen's test: deferred today         *(Flexion, ABduction and External Rotation)  Gait & Posture Assessment  Ambulation: Unassisted Gait: Relatively normal for age and body habitus Posture: WNL   Lower Extremity Exam    Side: Right lower extremity  Side: Left lower  extremity  Stability: No instability observed          Stability: No instability observed          Skin & Extremity Inspection: Skin color, temperature, and hair growth are WNL. No peripheral edema or cyanosis. No masses, redness, swelling, asymmetry, or associated skin lesions. No contractures.  Skin & Extremity Inspection: Skin color, temperature, and hair growth are WNL. No peripheral edema or cyanosis. No masses, redness, swelling, asymmetry, or associated skin lesions. No contractures.  Functional ROM: Unrestricted ROM                  Functional ROM: Unrestricted ROM                  Muscle Tone/Strength: Functionally intact. No obvious neuro-muscular anomalies detected.  Muscle Tone/Strength: Functionally intact. No obvious neuro-muscular anomalies detected.  Sensory (Neurological): Unimpaired        Sensory (Neurological): Unimpaired        DTR: Patellar: deferred today Achilles: deferred today Plantar: deferred today  DTR: Patellar: deferred today Achilles: deferred today Plantar: deferred today  Palpation: No palpable anomalies  Palpation: No palpable anomalies   ***Procedure

## 2019-12-23 DIAGNOSIS — M5030 Other cervical disc degeneration, unspecified cervical region: Secondary | ICD-10-CM | POA: Insufficient documentation

## 2019-12-23 DIAGNOSIS — S22080S Wedge compression fracture of T11-T12 vertebra, sequela: Secondary | ICD-10-CM | POA: Insufficient documentation

## 2019-12-23 DIAGNOSIS — Z79899 Other long term (current) drug therapy: Secondary | ICD-10-CM | POA: Insufficient documentation

## 2019-12-23 DIAGNOSIS — F1120 Opioid dependence, uncomplicated: Secondary | ICD-10-CM | POA: Insufficient documentation

## 2019-12-23 DIAGNOSIS — M4310 Spondylolisthesis, site unspecified: Secondary | ICD-10-CM | POA: Insufficient documentation

## 2019-12-23 DIAGNOSIS — M4316 Spondylolisthesis, lumbar region: Secondary | ICD-10-CM | POA: Insufficient documentation

## 2019-12-23 DIAGNOSIS — Z789 Other specified health status: Secondary | ICD-10-CM | POA: Insufficient documentation

## 2019-12-23 DIAGNOSIS — M542 Cervicalgia: Secondary | ICD-10-CM | POA: Insufficient documentation

## 2019-12-23 DIAGNOSIS — G894 Chronic pain syndrome: Secondary | ICD-10-CM | POA: Insufficient documentation

## 2019-12-23 DIAGNOSIS — M79604 Pain in right leg: Secondary | ICD-10-CM | POA: Insufficient documentation

## 2019-12-23 DIAGNOSIS — M899 Disorder of bone, unspecified: Secondary | ICD-10-CM | POA: Insufficient documentation

## 2019-12-23 DIAGNOSIS — G8929 Other chronic pain: Secondary | ICD-10-CM | POA: Insufficient documentation

## 2019-12-23 DIAGNOSIS — M5137 Other intervertebral disc degeneration, lumbosacral region: Secondary | ICD-10-CM | POA: Insufficient documentation

## 2019-12-23 DIAGNOSIS — M47812 Spondylosis without myelopathy or radiculopathy, cervical region: Secondary | ICD-10-CM | POA: Insufficient documentation

## 2019-12-23 DIAGNOSIS — R937 Abnormal findings on diagnostic imaging of other parts of musculoskeletal system: Secondary | ICD-10-CM | POA: Insufficient documentation

## 2019-12-23 DIAGNOSIS — M4807 Spinal stenosis, lumbosacral region: Secondary | ICD-10-CM | POA: Insufficient documentation

## 2019-12-23 DIAGNOSIS — M47816 Spondylosis without myelopathy or radiculopathy, lumbar region: Secondary | ICD-10-CM | POA: Insufficient documentation

## 2019-12-23 NOTE — Progress Notes (Signed)
Patient: Carlos Neal.  Service Category: E/M  Provider: Gaspar Cola, MD  DOB: [birth year]  DOS: 12/22/2019  Referring Provider: Dion Body, MD  MRN: [id number]  Setting: Ambulatory outpatient  PCP: Dion Body, MD  Type: New Patient  Specialty: Interventional Pain Management    Location: Office  Delivery: Face-to-face     Primary Reason(s) for Visit: Encounter for initial evaluation of one or more chronic problems (new to examiner) potentially causing chronic pain, and posing a threat to normal musculoskeletal function. (Level of risk: High) CC: Back Pain (lower)  HPI  Mr. Carlos Neal is a 79 y.o. year old, male patient, who comes for the first time to our practice referred by Dion Body, MD for our initial evaluation of his chronic pain. 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[place]); Chronic lower extremity pain (1ry area of Pain) (Right); 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  Today he comes in for evaluation of his Back Pain (lower)  Pain Assessment: Location: Lower Back Radiating: right upper leg Onset: More than a month ago Duration: Chronic pain Quality: Burning Severity: 10-Worst pain ever/10 (subjective, self-reported pain score)  Effect on ADL: difficulty walking Timing: Intermittent Modifying factors: sitting, heat, Oxycodone BP: (!) 154/73  HR: 96  Onset and Duration: Present longer than 3 months Cause of pain: exercise Severity: No change since onset, NAS-11 at its worse: 10/10, NAS-11 now: 3/10 and NAS-11 on the average: 3/10 Timing: Not influenced by the time of the day Aggravating Factors: Bowel movements, Kneeling, Lifiting, Prolonged standing, Twisting and Walking uphill Alleviating Factors: Hot packs, Medications, Resting, TENS and physical therapy Associated Problems: Constipation, Night-time cramps and Pain that wakes patient up Quality of Pain: Agonizing, Burning, Hot and Sharp Previous Examinations or Tests: MRI scan and X-rays Previous Treatments: Physical Therapy, Relaxation therapy and TENS  According to the patient the primary area of pain is that of the right lower extremity over the anterior thigh, which feels like a burning tingling sensation.  The distribution of the pain appears to follow an L2/L3 dermatomal distribution.  The patient denies any pain from the knee down.  He also denies any numbness or weakness below the knee.  He denies any surgeries in that area or having had any recent x-rays of the area.  He does indicate having had some interventional therapies performed by Dr. Sharlet Salina.  The patient secondary area of pain is that of the lower back, bilaterally, (R>L).  He denies any prior back surgeries, but he indicates having recently had to MRIs of the lumbar spine.  The patient also indicates having had several nerve blocks and injections by Dr. Sharlet Salina.  Review of the medical record indicates that on  11/05/2019 the patient had a bilateral L3-4 transforaminal epidural steroid injection, followed on  12/09/2019 by a second right-sided L3-4 transforaminal epidural steroid injection.  The patient indicates that both injections failed to provide him with any long-term benefit and therefore he is not interested in having anymore.  On 10/20/2019 the patient had his first lumbar MRI.  According to the report he was experiencing acute bilateral low back pain with bilateral sciatica.  For some unknown reason on 12/04/2019 he had a second lumbar MRI which indicated that at the time he was experiencing low back pain with right lower extremity burning sensation.  Review of the most recent MRI reveals:  (12/04/2019) LUMBAR MRI FINDINGS: Alignment: Exaggerated lumbar lordosis with L4-5 anterolisthesis. There is retrolisthesis at T12-L1 and L1-2 exaggerated kyphosis at the thoracolumbar junction. Vertebrae: Discogenic endplate edema at Q2-5. no fracture or suspected bone lesion. Conus medullaris and cauda equina: Conus extends to the T12 level. Conus appears normal. There is nerve root redundancy below L2-3 due to the severity of spinal stenosis. Paraspinal and other soft tissues: Left far-lateral to L3 is a 14 mm cystic structure with internal debris like appearance, new/progressive from before and contiguous with a left far-lateral extrude.  Disc levels: T12- L1: Anterior disc narrowing and spurring. Small central disc protrusion. No neural compression. L1-2: Asymmetric rightward disc collapse with endplate spurring. Asymmetric right facet spurring. Right foraminal stenosis is mild or moderate. L2-3: Disc collapse and endplate degeneration with discogenic edema. Facet osteoarthritis with asymmetric left-sided spurring. Severe spinal stenosis. Biforaminal impingement and marked left far-lateral impingement due to a extrusion with adjacent cyst that is also mentioned above. L3-4: Disc narrowing and bulging. Facet  osteoarthritis and ligamentum flavum thickening with interspinous ganglion. No neural compression. L4-5: Facet osteoarthritis with spurring and anterolisthesis. Disc narrowing asymmetric to the left where there is greater bulging and ridging. Moderate left foraminal stenosis. Patent canal L5-S1: Facet osteoarthritis with moderate spurring. Negative disc space. No neural impingement.  IMPRESSION: 1. Advanced disc and facet degeneration with exaggerated lumbar lordosis and L4-5 anterolisthesis. 2. Dominant findings at L2-3 where there is advanced spinal stenosis and biforaminal impingement. Also at this level is a left far-lateral extrusion with adjacent 14 mm cyst that has developed/progressed from comparison 10/20/2019, presumably disc. Marrow signal abnormality at this level is attributed to discogenic inflammation; no evident bone lesion. 3. L4-5 moderate left foraminal stenosis.  The second MRI was already available to Dr. Sharlet Salina by the time that he went on to do the second injection.  However, I fail to understand his choice of again injecting the L3-4 level when the worse pathology seems to be at the L2-3 level and the dermatomal pain that the patient is experiencing seems to be an L2.  Apparently, the patient has also been followed by Dr. Elayne Guerin (neurosurgeon) who informed the patient that due to his pathology he was scheduling him to do a right L2-3 decompression and discectomy.  He has the surgery scheduled for January 14.  In addition to the above, review of the patient's chart revealed that he has also experienced problems with right sided cervicalgia apparently thought to be a cervical facet syndrome for which on 04/17/2019 he had a right sided C3, C4, and C5 MBB done by Dr. Sharlet Salina and followed by a repeat block on 05/21/2019.  According to the last note from Dr. Sharlet Salina, the patient seemed to have improved with the second injection and therefore they decided to postpone  on following up with a right-sided C3-4 and C4-5 facet joint radiofrequency ablation.  Having reviewed the above  information I asked the patient if he had been sent over to [place] for treatment of his chronic pain with interventional therapies, which is what we specialize in.  However, he indicated that he was sent over because he went on to ask his primary care physician to provide him with an increase in his pain medication (oxycodone IR 5 mg), and he was told that he could not do that and that he was going to send him to the [facility]" for [place] to give him pain medicine.  I went on to review how the patient is taking his medicines and he revealed that this 5 the fact that he had been given a prescription for oxycodone IR 5 mg where he could take as much as 1 tablet p.o. every 4 hours, he felt that he could do okay with 1 tablet p.o. 3 times daily.  However, since he was told that they would not be increasing the medication he has been concerned about it and has actually gone down to only taking 1 tablet p.o. twice daily.  With this, he refers that he is managing.  Today I took the time to inform the patient that our specialty is interventional pain management and that we do not take patients for medication management only.  Since he had already failed to get benefit from prior injections done by Dr. Sharlet Salina, he is assuming that any further treatments that involve interventional therapies will also not be effective.  This is a huge problem that we continue to encounter with patients that have injections with other physicians, especially those that are not board-certified in interventional pain management.  At this point the patient is not interested in trying any further interventional therapies and simply wants to continue on his pain medication until he can have his surgery to decompress the L2-3 level.  Personally I think that this is a choice that is up to the patient and therefore I have no problems with it.   Having reviewed his medical records, I believe that the patient has the appropriate medical indications for the temporary use of opioid analgesics until he can have his surgery done.  1 thing that I would recommend he is to consider starting the patient on a membrane stabilizer since the type of pain that he is experiencing is neuropathic and would probably respond well to the use of gabapentin or pregabalin.  Since the patient already had his interventional therapies done elsewhere and I do not take patients for medication management only, I will limit today's encounter to an evaluation and recommendations.  Historic Controlled Substance Pharmacotherapy Review  PMP and historical list of controlled substances: Oxycodone IR 5 mg, 1 tablet p.o. every 4 hours PRN for pain; tramadol 50 mg, 1 tablet p.o. every 6 hours Current opioid analgesics:  Oxycodone IR 5 mg, 1 tablet p.o. twice daily (10 mg/day of oxycodone) MME/day: 15 mg/day  Historical Monitoring: The patient  has no history on file for drug use. List of all UDS Test(s): No results found. List of other Serum/Urine Drug Screening Test(s):  No results found. Historical Background Evaluation: [place] PMP: PDMP reviewed during this encounter. Online review of the past 93-monthperiod conducted.             PMP NARX Score Report:  Narcotic: 290 Sedative: 180 Stimulant: 000 [place] Department of public safety, offender search: (Editor, commissioningInformation) Non-contributory Risk Assessment Profile: Aberrant behavior: None observed or detected today Risk factors for fatal opioid overdose: None identified  today PMP NARX Overdose Risk Score: 180 Fatal overdose hazard ratio (HR): Calculation deferred Non-fatal overdose hazard ratio (HR): Calculation deferred Risk of opioid abuse or dependence: 0.7-3.0% with doses ? 36 MME/day and 6.1-26% with doses ? 120 MME/day. Substance use disorder (SUD) risk level: See below Personal History of Substance Abuse  (SUD-Substance use disorder):  Alcohol: Negative  Illegal Drugs: Negative  Rx Drugs: Negative  ORT Risk Level calculation: Low Risk  Opioid Risk Tool - 12/22/19 1453      Family History of Substance Abuse   Alcohol Negative    Illegal Drugs Negative    Rx Drugs Negative      Personal History of Substance Abuse   Alcohol Negative    Illegal Drugs Negative    Rx Drugs Negative      Age   Age between 63-45 years  No      History of Preadolescent Sexual Abuse   History of Preadolescent Sexual Abuse Negative or Male      Psychological Disease   Psychological Disease Negative    Depression Negative      Total Score   Opioid Risk Tool Scoring 0    Opioid Risk Interpretation Low Risk          ORT Scoring interpretation table:  Score <3 = Low Risk for SUD  Score between 4-7 = Moderate Risk for SUD  Score >8 = High Risk for Opioid Abuse   PHQ-2 Depression Scale:  Total score: 0  PHQ-2 Scoring interpretation table: (Score and probability of major depressive disorder)  Score 0 = No depression  Score 1 = 15.4% Probability  Score 2 = 21.1% Probability  Score 3 = 38.4% Probability  Score 4 = 45.5% Probability  Score 5 = 56.4% Probability  Score 6 = 78.6% Probability   PHQ-9 Depression Scale:  Total score: 0  PHQ-9 Scoring interpretation table:  Score 0-4 = No depression  Score 5-9 = Mild depression  Score 10-14 = Moderate depression  Score 15-19 = Moderately severe depression  Score 20-27 = Severe depression (2.4 times higher risk of SUD and 2.89 times higher risk of overuse)   Pharmacologic Plan: As per protocol, I have not taken over any controlled substance management, pending the results of ordered tests and/or consults.            Initial impression: Pending review of available data and ordered tests.  Meds   Current Outpatient Medications:  .  Acetaminophen (TYLENOL PO), Take 1,000 mg by mouth 3 (three) times daily as needed., Disp: , Rfl:  .  ASPIRIN 81 PO,  Take by mouth daily., Disp: , Rfl:  .  brimonidine (ALPHAGAN P) 0.1 % SOLN, 1 drop in the morning and at bedtime., Disp: , Rfl:  .  Calcium Carbonate-Vit D-Min (CALTRATE 600+D PLUS MINERALS) 600-800 MG-UNIT TABS, Take by mouth daily., Disp: , Rfl:  .  diclofenac Sodium (VOLTAREN) 1 % GEL, Apply topically as needed., Disp: , Rfl:  .  gabapentin (NEURONTIN) 300 MG capsule, Take 300 mg by mouth at bedtime., Disp: , Rfl:  .  hydrochlorothiazide (HYDRODIURIL) 25 MG tablet, Take 25 mg by mouth daily., Disp: , Rfl:  .  ibuprofen (ADVIL) 800 MG tablet, Take 800 mg by mouth every 8 (eight) hours as needed., Disp: , Rfl:  .  losartan (COZAAR) 25 MG tablet, Take 25 mg by mouth daily., Disp: , Rfl:  .  Multiple Vitamins-Minerals (PRESERVISION AREDS PO), Take by mouth daily., Disp: , Rfl:  .  naproxen sodium (ALEVE) 220 MG tablet, Take 220 mg by mouth 2 (two) times daily as needed., Disp: , Rfl:  .  omeprazole (PRILOSEC) 20 MG capsule, Take 20 mg by mouth daily., Disp: , Rfl:  .  oxyCODONE (ROXICODONE) 5 MG immediate release tablet, Take 5 mg by mouth 2 (two) times daily., Disp: , Rfl:  .  RIBOFLAVIN PO, Take by mouth daily., Disp: , Rfl:  .  traZODone (DESYREL) 50 MG tablet, Take 50 mg by mouth at bedtime as needed for sleep., Disp: , Rfl:   Imaging Review  Lumbosacral Imaging: Lumbar MR wo contrast: Results for orders placed during the hospital encounter of 10/20/19 MR LUMBAR SPINE WO CONTRAST  Narrative CLINICAL DATA:  Acute bilateral low back pain with bilateral sciatica. Additional history provided by technologist: Patient started Silver [employer]neakers 6 months ago, bilateral posterior hip pain.  EXAM: MRI LUMBAR SPINE WITHOUT CONTRAST  TECHNIQUE: Multiplanar, multisequence MR imaging of the lumbar spine was performed. No intravenous contrast was administered.  COMPARISON:  Report from lumbar spine MRI 10/02/2019 (images unavailable).  FINDINGS: Segmentation: For the purposes of this dictation,  five lumbar vertebrae are assumed and the caudal most well-formed intervertebral disc is designated L5-S1.  Alignment: S shaped scoliosis. T12-L1 and L1-L2 grade 1 retrolisthesis. L4-L5 grade 1 anterolisthesis.  Vertebrae: Mild chronic anterior wedging of the T12 vertebral body. Vertebral body height is otherwise maintained. Multilevel degenerative endplate irregularity with mixed degenerative endplate marrow signal. Schmorl nodes within the L1 and L3 superior endplates. There is prominent endplate edema at G0-F7, also extending into the right-sided pedicles at these levels. Additional extensive T2/STIR hyperintense signal abnormality throughout much of the L2 vertebral body. Prominent multilevel ventrolateral osteophytes.  Conus medullaris and cauda equina: Conus extends to the T12-L1 level. No signal abnormality within the visualized distal spinal cord.  Paraspinal and other soft tissues: Atrophy of the left psoas muscle (series 10, image 16). No other abnormality is identified within included portions of the abdomen/retroperitoneum. Atrophy of the lumbar paraspinal musculature.  Disc levels:  Multilevel disc degeneration most notably as follows. There is advanced disc degeneration on the right at T12-L1, L1-L2 and L2-L3. Moderate/advanced disc degeneration at L4-L5.  T11-T12: This level is imaged sagittally. Disc bulge. Superimposed central disc protrusion. The disc protrusion contributes to mild spinal canal stenosis, contacting and mildly flattening the distal spinal cord. No significant foraminal stenosis.  T12-L1: Grade 1 retrolisthesis. Disc bulge asymmetric to the right. Osteophyte ridge greatest along the right lateral aspect of the disc space. Facet arthrosis. The disc bulge contributes to mild spinal canal stenosis, contacting multiple descending nerve roots within the ventral spinal canal. No significant foraminal stenosis.  L1-L2: Grade 1 retrolisthesis. Disc  bulge. Prominent osteophyte ridge along the right ventrolateral aspect of the disc space. Facet arthrosis. No significant spinal canal stenosis. Moderate right neural foraminal narrowing. Additionally, disc osteophyte ridge may contact the exiting right L1 nerve root beyond the right neural foramen.  L2-L3: Large irregular disc bulge with circumferential osteophyte ridge. Advanced facet arthrosis with ligamentum flavum hypertrophy. Severe spinal canal stenosis with likely impingement of the descending cauda equina nerve roots (series 5, image 9) (series 10, image 14). Bilateral neural foraminal narrowing (moderate right, mild left).  L3-L4: Disc bulge. Moderate/advanced facet arthrosis with ligamentum flavum hypertrophy. Mild bilateral subarticular narrowing without frank nerve root impingement. Central canal patent. Borderline mild right neural foraminal narrowing. Moderate left neural foraminal narrowing.  L4-L5: Grade 1 anterolisthesis. Grade 1 anterolisthesis. Disc uncovering with disc bulge.  Osteophyte ridge greatest along left lateral aspect of the disc space. Advanced facet arthrosis with ligamentum flavum hypertrophy. Mild bilateral subarticular narrowing without nerve root impingement. Central canal patent. Moderate right neural foraminal narrowing. Borderline mild left neural foraminal narrowing.  L5-S1: No significant disc herniation. Moderate/advanced facet arthrosis. No significant spinal canal stenosis. Mild bilateral neural foraminal narrowing.  Impression #1 and #3 will be called to the ordering clinician or representative by the Radiologist Assistant, and communication documented in the PACS or [employer].  IMPRESSION: At L2-L3, there is prominent endplate edema in the presence of severe disc degeneration. There is additional T2/STIR hyperintense signal abnormality throughout much of the L2 vertebral body demonstrating a somewhat rounded appearance. A  component of this signal abnormality is almost certainly degenerative. However, the extent and appearance of the signal abnormality within the L2 vertebral body raises the possibility of an underlying lesion (i.e. atypical hemangioma or metastasis). Additionally, clinical correlation is recommended to exclude signs or symptoms of infection. MRI follow-up is recommended for surveillance of these findings.  Lumbar spondylosis as outlined with findings most notably as follows.  At L2-L3, there is severe disc degeneration. Multifactorial severe spinal canal stenosis with likely cauda equina nerve root impingement. Bilateral neural foraminal narrowing (moderate right, mild left).  No more than mild spinal canal stenosis at the remaining levels. Of note, a T11-T12 disc bulge contacts and mildly flattens the ventral spinal cord. A T12-L1, a disc bulge contacts the descending cauda equina nerve roots within the ventral aspect of the spinal canal. Additional sites of mild and moderate neural foraminal narrowing as described  Severe disc degeneration at T12-L1, L1-L2, L3-L4 and L4-L5.  Additional levels of facet arthrosis (including levels of advanced facet arthrosis) as above.  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   Electronically Signed By: Kellie Simmering DO On: 10/20/2019 17:06  Lumbar MR w/wo contrast: Results for orders placed during the hospital encounter of 12/03/19 MR Lumbar Spine W Wo Contrast  Narrative CLINICAL DATA:  Low back pain with pain and burning in the right leg.  EXAM: MRI LUMBAR SPINE WITHOUT AND WITH CONTRAST  TECHNIQUE: Multiplanar and multiecho pulse sequences of the lumbar spine were obtained without and with intravenous contrast.  CONTRAST:  36m GADAVIST GADOBUTROL 1 MMOL/ML IV SOLN  COMPARISON:  10/20/2019  FINDINGS: Segmentation:  Standard lumbar numbering  Alignment: Exaggerated lumbar lordosis  with L4-5 anterolisthesis. There is retrolisthesis at T12-L1 and L1-2 exaggerated kyphosis at the thoracolumbar junction.  Vertebrae: Discogenic endplate edema at LV9-4 no fracture or suspected bone lesion.  Conus medullaris and cauda equina: Conus extends to the T12 level. Conus appears normal. There is nerve root redundancy below L2-3 due to the severity of spinal stenosis.  Paraspinal and other soft tissues: Left far-lateral to L3 is a 14 mm cystic structure with internal debris like appearance, new/progressive from before and contiguous with a left far-lateral extrude.  Disc levels:  T12- L1: Anterior disc narrowing and spurring. Small central disc protrusion. No neural compression.  L1-L2: Asymmetric rightward disc collapse with endplate spurring. Asymmetric right facet spurring. Right foraminal stenosis is mild or moderate.  L2-L3: Disc collapse and endplate degeneration with discogenic edema. Facet osteoarthritis with asymmetric left-sided spurring. Severe spinal stenosis. Biforaminal impingement and marked left far-lateral impingement due to a extrusion with adjacent cyst that is also mentioned above.  L3-L4: Disc narrowing and bulging. Facet osteoarthritis and ligamentum flavum thickening with interspinous ganglion. No neural compression.  L4-L5: Facet  osteoarthritis with spurring and anterolisthesis. Disc narrowing asymmetric to the left where there is greater bulging and ridging. Moderate left foraminal stenosis. Patent canal  L5-S1:Facet osteoarthritis with moderate spurring. Negative disc space. No neural impingement.  IMPRESSION: 1. Advanced disc and facet degeneration with exaggerated lumbar lordosis and L4-5 anterolisthesis. 2. Dominant findings at L2-3 where there is advanced spinal stenosis and biforaminal impingement. Also at this level is a left far-lateral extrusion with adjacent 14 mm cyst that has developed/progressed from comparison 10/20/2019,  presumably disc. Marrow signal abnormality at this level is attributed to discogenic inflammation; no evident bone lesion. 3. L4-5 moderate left foraminal stenosis.  Electronically Signed By: Monte Fantasia M.D. On: 12/04/2019 09:14  Complexity Note: Imaging results reviewed. Results shared with Mr. Doeden, using Layman's terms.                        ROS  Cardiovascular: High blood pressure Pulmonary or Respiratory: Wheezing and difficulty taking a deep full breath (Asthma) and Snoring  Neurological: Curved spine Psychological-Psychiatric: No reported psychological or psychiatric signs or symptoms such as difficulty sleeping, anxiety, depression, delusions or hallucinations (schizophrenial), mood swings (bipolar disorders) or suicidal ideations or attempts Gastrointestinal: Reflux or heatburn Genitourinary: No reported renal or genitourinary signs or symptoms such as difficulty voiding or producing urine, peeing blood, non-functioning kidney, kidney stones, difficulty emptying the bladder, difficulty controlling the flow of urine, or chronic kidney disease Hematological: No reported hematological signs or symptoms such as prolonged bleeding, low or poor functioning platelets, bruising or bleeding easily, hereditary bleeding problems, low energy levels due to low hemoglobin or being anemic Endocrine: No reported endocrine signs or symptoms such as high or low blood sugar, rapid heart rate due to high thyroid levels, obesity or weight gain due to slow thyroid or thyroid disease Rheumatologic: Joint aches and or swelling due to excess weight (Osteoarthritis) Musculoskeletal: Negative for myasthenia gravis, muscular dystrophy, multiple sclerosis or malignant hyperthermia Work History: Retired  Allergies  Mr. Cummiskey has No Known Allergies.  Laboratory Chemistry Profile   Renal No results found.   Electrolytes No results found.   Hepatic No results found.   ID Lab Results  Component  Value Date   [place] Not Detected 10/14/2018     Bone No results found.   Endocrine No results found.   Neuropathy No results found.   CNS No results found.   Inflammation (CRP: Acute  ESR: Chronic) No results found.   Rheumatology No results found.   Coagulation Lab Results  Component Value Date   PLT 295 10/01/2013     Cardiovascular Lab Results  Component Value Date   HGB 15.0 10/01/2013   HCT 43.4 10/01/2013     Screening Lab Results  Component Value Date   SARSCOV2NAA Not Detected 10/14/2018     Cancer No results found.   Allergens No results found.     Note: Lab results reviewed.  PFSH  Drug: Mr. Yurko  has no history on file for drug use. Alcohol:  reports current alcohol use. Tobacco:  reports that he has never smoked. He has never used smokeless tobacco. Medical:  has no past medical history on file. Family: family history is not on file.  Active Ambulatory Problems    Diagnosis Date Noted  . Encounter for general adult medical examination without abnormal findings 12/01/2015  . Essential hypertension 12/20/2019  . GERD without esophagitis 12/20/2019  . 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 Impotence 12/20/2019  .  Insomnia 12/20/2019  . Osteopenia 12/20/2019  . Osteopenia of multiple sites 09/11/2017  . 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 Vaccine counseling 12/01/2015  . Chronic pain syndrome 12/23/2019  . Pharmacologic therapy 12/23/2019  . Disorder of skeletal system 12/23/2019  . Problems influencing health status 12/23/2019  . 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[place]) 12/23/2019  . Chronic lower extremity pain (1ry area of Pain) (Right) 12/23/2019  . 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 Abnormal MRI, lumbar spine (12/04/2019) 12/23/2019  .  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 Retrolisthesis (T12-L1, L1-2) 12/23/2019  . Lumbosacral foraminal stenosis 12/23/2019  . Lumbar facet arthropathy 12/23/2019  . 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 Lumbar facet syndrome (Bilateral) 12/23/2019  . Wedge compression fracture of T12 vertebra, sequela 12/23/2019  . DDD (degenerative disc disease), lumbosacral 12/23/2019  . DDD (degenerative disc disease), cervical 12/23/2019  . Cervical facet syndrome (Right) 12/23/2019  . Cervicalgia 12/23/2019  . Chronic neck pain (Right) 12/23/2019   Resolved Ambulatory Problems    Diagnosis Date Noted  . No Resolved Ambulatory Problems   No Additional Past Medical History   Constitutional Exam  General appearance: Well nourished, well developed, and well hydrated. In no apparent acute distress Vitals:   12/22/19 1436  BP: (!) 154/73  Pulse: 96  Resp: 16  Temp: 97.7 F (36.5 C)  TempSrc: Temporal  SpO2: 99%  Weight: 142 lb (64.4 kg)  Height: $Remove'5\' 4"'dfUcIgM$  (1.626 m)   BMI Assessment: Estimated body mass index is 24.37 kg/m as calculated from the following:   Height as of this encounter: $RemoveBeforeD'5\' 4"'RopqTZdBfzYmyv$  (1.626 m).   Weight as of this encounter: 142 lb (64.4 kg).  BMI interpretation table: BMI level Category Range association with higher incidence of chronic pain  <18 kg/m2 Underweight   18.5-24.9 kg/m2 Ideal body weight   25-29.9 kg/m2 Overweight Increased incidence by 20%  30-34.9 kg/m2 Obese (Class I) Increased incidence by 68%  35-39.9 kg/m2 Severe obesity (Class II) Increased incidence by 136%  >40 kg/m2 Extreme obesity (Class III) Increased incidence by 254%   Patient's current BMI Ideal Body weight  Body mass index is 24.37 kg/m. Ideal body weight: 59.2 kg (130 lb 8.2 oz) Adjusted ideal body weight: 61.3 kg (135 lb 1.7 oz)   BMI Readings from Last 4 Encounters:  12/22/19 24.37 kg/m   Wt Readings from Last 4 Encounters:  12/22/19 142 lb (64.4 kg)     Psych/Mental status: Alert, oriented x 3 (person, place, & time)       Eyes: PERLA Respiratory: No evidence of acute respiratory distress  Assessment  Primary Diagnosis & Pertinent Problem List: The primary encounter diagnosis was Chronic pain syndrome. 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  Visit Diagnosis (  New problems to examiner): 1. Chronic pain syndrome   2. Chronic lower extremity pain (1ry area of Pain) (Right)   3. Chronic low back pain (2ry area of Pain) (Bilateral) (R>L) w/ sciatica (Right)   4. Abnormal MRI, lumbar spine (12/04/2019)   5. 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 Retrolisthesis (T12-L1, L1-2)   7. 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 Lumbosacral foraminal stenosis   9. Lumbar facet arthropathy   10. Osteoarthritis of facet joint of lumbar spine   11. Lumbar facet syndrome (Bilateral)   12. Wedge compression fracture of T12 vertebra, sequela   13. DDD (degenerative disc disease), lumbosacral   14. DDD (degenerative disc disease), cervical   15. Cervical facet syndrome (Right)   16. Cervicalgia   17. Chronic neck pain (Right)   18. Pharmacologic  therapy   19. Disorder of skeletal system   20. Problems influencing health status   21. 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[place])    Plan of Care  Recommendations:   Today the patient has indicated that he is not interested in our interventional pain management services.  At this point, the patient is interested in simply getting some opioid analgesics or at least some type of medication to help with his pain until he can have his lumbar decompression done on January 14.  Because the patient has pain is neuropathic, I would recommend that he be started on a gabapentin titration at 100 mg p.o. at bedtime with increases every 7 days, as tolerated.  Regarding the opioid analgesic, he denies any problems with the current oxycodone IR 5 mg dose, except that he feels that it would work better if he could take it up to 3 times a day.  Currently he is only taking it twice a day.  Even with the twice a day does, he refers that it allows him to control the pain flareups where he can probably wait until January 14 to have his surgery done.  Based on my evaluation, the patient is an appropriate candidate for the use of this opioid analgesics at the request of dose of oxycodone IR 5 mg 3 times daily.  Although I do manage opioid analgesics, I only offer this option to patients that have been sent to my practice for their interventional pain management treatments.  I do not take patients for medication management only.  I certainly will not be managing opioid analgesics on any patients that have been referred to other practices for their interventional therapies.  I have informed this to the patient and since he is not interested in any interventional therapies based on his past experiences with other practices, I will simply limit my involvement in his case to providing some recommendations.  He understood and accepted.  At this point, I think it would certainly be appropriate and indicated to maintain this patient on  oxycodone IR 5 mg, 1 tab p.o. every 8 hours, until he can have his decompression done.  I would also suggest a trial of a membrane stabilizer such as gabapentin or pregabalin to help with the neurogenic lower extremity pain.  This could be attempted instead of the opioid analgesic but it could also be done in addition to, the pending on the effectiveness of that membrane stabilizer.  Provider-requested follow-up: Return if symptoms worsen or fail to improve.  No future appointments.  Note by: Gaspar Cola, MD Date: 12/22/2019; Time: 6:17 PM

## 2019-12-29 ENCOUNTER — Other Ambulatory Visit: Payer: Self-pay | Admitting: Neurosurgery

## 2020-01-07 ENCOUNTER — Encounter
Admission: RE | Admit: 2020-01-07 | Discharge: 2020-01-07 | Disposition: A | Discharge status: Home / Self Care | Payer: Medicare PPO | Source: Ambulatory Visit | Attending: Neurosurgery | Admitting: Neurosurgery

## 2020-01-07 ENCOUNTER — Other Ambulatory Visit: Payer: Self-pay

## 2020-01-07 DIAGNOSIS — Z01818 Encounter for other preprocedural examination: Secondary | ICD-10-CM | POA: Diagnosis not present

## 2020-01-07 LAB — CBC
HCT: 35.4 % — ABNORMAL LOW (ref 39.0–52.0)
Hemoglobin: 11.7 g/dL — ABNORMAL LOW (ref 13.0–17.0)
MCH: 27.5 pg (ref 26.0–34.0)
MCHC: 33.1 g/dL (ref 30.0–36.0)
MCV: 83.3 fL (ref 80.0–100.0)
Platelets: 316 10*3/uL (ref 150–400)
RBC: 4.25 MIL/uL (ref 4.22–5.81)
RDW: 14.3 % (ref 11.5–15.5)
WBC: 5.4 10*3/uL (ref 4.0–10.5)
nRBC: 0 % (ref 0.0–0.2)

## 2020-01-07 LAB — PROTIME-INR
INR: 1 (ref 0.8–1.2)
Prothrombin Time: 13.2 seconds (ref 11.4–15.2)

## 2020-01-07 LAB — BASIC METABOLIC PANEL
Anion gap: 11 (ref 5–15)
BUN: 16 mg/dL (ref 8–23)
CO2: 26 mmol/L (ref 22–32)
Calcium: 9.2 mg/dL (ref 8.9–10.3)
Chloride: 97 mmol/L — ABNORMAL LOW (ref 98–111)
Creatinine, Ser: 1.05 mg/dL (ref 0.61–1.24)
GFR, Estimated: >60 mL/min (ref >60)
Glucose, Bld: 131 mg/dL — ABNORMAL HIGH (ref 70–99)
Potassium: 3.7 mmol/L (ref 3.5–5.1)
Sodium: 134 mmol/L — ABNORMAL LOW (ref 135–145)

## 2020-01-07 LAB — URINALYSIS, ROUTINE W REFLEX MICROSCOPIC
Bilirubin Urine: NEGATIVE
Glucose, UA: NEGATIVE mg/dL
Hgb urine dipstick: NEGATIVE
Ketones, ur: NEGATIVE mg/dL
Leukocytes,Ua: NEGATIVE
Nitrite: NEGATIVE
Protein, ur: NEGATIVE mg/dL
Specific Gravity, Urine: 1.015 (ref 1.005–1.030)
pH: 7 (ref 5.0–8.0)

## 2020-01-07 LAB — APTT: aPTT: 29 seconds (ref 24–36)

## 2020-01-07 LAB — SURGICAL PCR SCREEN
MRSA, PCR: NEGATIVE
Staphylococcus aureus: NEGATIVE

## 2020-01-07 LAB — TYPE AND SCREEN
ABO/RH(D): B POS
Antibody Screen: NEGATIVE

## 2020-01-07 NOTE — Patient Instructions (Addendum)
Your procedure is scheduled on: 01/15/2020- Friday Report to the Registration Desk on the 1st floor of the [facility]. To find out your arrival time, please call [phone] between 1PM - 3PM on: 01/14/2020- Thursday  REMEMBER: Instructions that are not followed completely may result in serious medical risk, up to and including death; or upon the discretion of your surgeon and anesthesiologist your surgery may need to be rescheduled.  Do not eat food after midnight the night before surgery.  No gum chewing, lozengers or hard candies.  You may however, drink CLEAR liquids up to 2 hours before you are scheduled to arrive for your surgery. Do not drink anything within 2 hours of your scheduled arrival time.  Clear liquids include: - water  - apple juice without pulp - gatorade (not RED, PURPLE, OR [place]) - black coffee or tea (Do NOT add milk or creamers to the coffee or tea) Do NOT drink anything that is not on this list.  Type 1 and Type 2 diabetics should only drink water.  TAKE THESE MEDICATIONS THE MORNING OF SURGERY WITH A SIP OF WATER: - omeprazole (PRILOSEC) 20 MG capsule, take one the night before and one on the morning of surgery - helps to prevent nausea after surgery.  - brimonidine (ALPHAGAN P) 0.1 % SOLN - oxyCODONE (OXY IR/ROXICODONE) 5 MG immediate release tablet as directed if needed - TYLENOL) 500 MG tablet as directed if needed  Follow recommendations from Cardiologist, Pulmonologist or PCP regarding stopping Aspirin, Coumadin, Plavix, Eliquis, Pradaxa, or Pletal. Stop 01/07/20, may resume 2 weeks after surgery.  One week prior to surgery: Stop one 01/07/20: Stop Anti-inflammatories (NSAIDS) such as Advil, Aleve, Ibuprofen, Motrin, Naproxen, Naprosyn and Aspirin based products such as Excedrin, [place]ys Powder, BC Powder.  Stop ANY OVER THE COUNTER supplements until after surgery.Stop taking 01/07/20- RIBOFLAVIN PO (However, you may continue taking Vitamin D,  Vitamin B, and multivitamin up until the day before surgery.)  No Alcohol for 24 hours before or after surgery.  No Smoking including e-cigarettes for 24 hours prior to surgery.  No chewable tobacco products for at least 6 hours prior to surgery.  No nicotine patches on the day of surgery.  Do not use any "recreational" drugs for at least a week prior to your surgery.  Please be advised that the combination of cocaine and anesthesia may have negative outcomes, up to and including death. If you test positive for cocaine, your surgery will be cancelled.  On the morning of surgery brush your teeth with toothpaste and water, you may rinse your mouth with mouthwash if you wish. Do not swallow any toothpaste or mouthwash.  Do not wear jewelry, make-up, hairpins, clips or nail polish.  Do not wear lotions, powders, or perfumes.   Do not shave body from the neck down 48 hours prior to surgery just in case you cut yourself which could leave a site for infection.  Also, freshly shaved skin may become irritated if using the CHG soap.  Contact lenses, hearing aids and dentures may not be worn into surgery.  Do not bring valuables to the hospital. [facility] is not responsible for any missing/lost belongings or valuables.   Use CHG Soap or wipes as directed on instruction sheet.  Notify your doctor if there is any change in your medical condition (cold, fever, infection).  Wear comfortable clothing (specific to your surgery type) to the hospital.  Plan for stool softeners for home use; pain medications have a tendency  to cause constipation. You can also help prevent constipation by eating foods high in fiber such as fruits and vegetables and drinking plenty of fluids as your diet allows.  After surgery, you can help prevent lung complications by doing breathing exercises.  Take deep breaths and cough every 1-2 hours. Your doctor may order a device called an Incentive Spirometer to help you  take deep breaths. When coughing or sneezing, hold a pillow firmly against your incision with both hands. This is called "splinting." Doing this helps protect your incision. It also decreases belly discomfort.  If you are being admitted to the hospital overnight, leave your suitcase in the car. After surgery it may be brought to your room.  If you are being discharged the day of surgery, you will not be allowed to drive home. You will need a responsible adult (18 years or older) to drive you home and stay with you that night.   If you are taking public transportation, you will need to have a responsible adult (18 years or older) with you. Please confirm with your physician that it is acceptable to use public transportation.   Please call the Pre-admissions Testing Dept. at [phone] if you have any questions about these instructions.  Visitation Policy:  Patients undergoing a surgery or procedure may have one family member or support person with them as long as that person is not COVID-19 positive or experiencing its symptoms.  That person may remain in the waiting area during the procedure.  Inpatient Visitation:    Visiting hours are 7 a.m. to 8 p.m. Patients will be allowed one visitor. The visitor may change daily. The visitor must pass COVID-19 screenings, use hand sanitizer when entering and exiting the patient's room and wear a mask at all times, including in the patient's room. Patients must also wear a mask when staff or their visitor are in the room. Masking is required regardless of vaccination status. Systemwide, no visitors 17 or younger.

## 2020-01-08 ENCOUNTER — Other Ambulatory Visit: Payer: Self-pay | Admitting: Pain Medicine

## 2020-01-13 ENCOUNTER — Other Ambulatory Visit
Admission: RE | Admit: 2020-01-13 | Discharge: 2020-01-13 | Disposition: A | Discharge status: Home / Self Care | Payer: Medicare PPO | Source: Ambulatory Visit | Attending: Neurosurgery | Admitting: Neurosurgery

## 2020-01-13 ENCOUNTER — Other Ambulatory Visit: Payer: Self-pay

## 2020-01-13 DIAGNOSIS — Z20822 Contact with and (suspected) exposure to covid-19: Secondary | ICD-10-CM | POA: Insufficient documentation

## 2020-01-13 DIAGNOSIS — Z01812 Encounter for preprocedural laboratory examination: Secondary | ICD-10-CM | POA: Diagnosis present

## 2020-01-13 LAB — SARS CORONAVIRUS 2 (TAT 6-24 HRS): SARS Coronavirus 2: NEGATIVE

## 2020-01-15 ENCOUNTER — Ambulatory Visit: Payer: Medicare PPO

## 2020-01-15 ENCOUNTER — Ambulatory Visit: Payer: Medicare PPO | Admitting: Certified Registered Nurse Anesthetist

## 2020-01-15 ENCOUNTER — Encounter
Admission: RE | Disposition: A | Discharge status: Home / Self Care | Payer: Self-pay | Source: Ambulatory Visit | Attending: Neurosurgery

## 2020-01-15 ENCOUNTER — Ambulatory Visit
Admission: RE | Admit: 2020-01-15 | Discharge: 2020-01-15 | Disposition: A | Discharge status: Home / Self Care | Payer: Medicare PPO | Source: Ambulatory Visit | Attending: Neurosurgery | Admitting: Neurosurgery

## 2020-01-15 ENCOUNTER — Encounter: Payer: Self-pay | Admitting: Neurosurgery

## 2020-01-15 DIAGNOSIS — M5126 Other intervertebral disc displacement, lumbar region: Secondary | ICD-10-CM | POA: Insufficient documentation

## 2020-01-15 DIAGNOSIS — Z419 Encounter for procedure for purposes other than remedying health state, unspecified: Secondary | ICD-10-CM

## 2020-01-15 DIAGNOSIS — M5416 Radiculopathy, lumbar region: Secondary | ICD-10-CM | POA: Diagnosis not present

## 2020-01-15 DIAGNOSIS — Z8262 Family history of osteoporosis: Secondary | ICD-10-CM | POA: Diagnosis not present

## 2020-01-15 DIAGNOSIS — M4186 Other forms of scoliosis, lumbar region: Secondary | ICD-10-CM | POA: Diagnosis not present

## 2020-01-15 DIAGNOSIS — M48062 Spinal stenosis, lumbar region with neurogenic claudication: Secondary | ICD-10-CM | POA: Insufficient documentation

## 2020-01-15 HISTORY — DX: Unspecified osteoarthritis, unspecified site: M19.90

## 2020-01-15 HISTORY — DX: Unspecified asthma, uncomplicated: J45.909

## 2020-01-15 HISTORY — DX: Essential (primary) hypertension: I10

## 2020-01-15 HISTORY — PX: LUMBAR LAMINECTOMY/ DECOMPRESSION WITH MET-RX: SHX5959

## 2020-01-15 HISTORY — DX: Gastro-esophageal reflux disease without esophagitis: K21.9

## 2020-01-15 HISTORY — DX: Vitamin D deficiency, unspecified: E55.9

## 2020-01-15 LAB — ABO/RH: ABO/RH(D): B POS

## 2020-01-15 SURGERY — LUMBAR LAMINECTOMY/ DECOMPRESSION WITH MET-RX
Anesthesia: General | Laterality: Right

## 2020-01-15 MED ORDER — HYDROMORPHONE HCL 1 MG/ML IJ SOLN: 0.2500 mg | INTRAMUSCULAR | Status: DC | PRN

## 2020-01-15 MED ORDER — BUPIVACAINE-EPINEPHRINE (PF) 0.5% -1:200000 IJ SOLN
INTRAMUSCULAR | Status: DC | PRN
  Administered 2020-01-15: 8 mL

## 2020-01-15 MED ORDER — BUPIVACAINE HCL (PF) 0.5 % IJ SOLN
INTRAMUSCULAR | Status: AC
  Filled 2020-01-15: qty 30

## 2020-01-15 MED ORDER — OXYCODONE HCL 5 MG PO TABS: 5.0000 mg | ORAL_TABLET | Freq: Once | ORAL | Status: DC | PRN

## 2020-01-15 MED ORDER — PROPOFOL 10 MG/ML IV BOLUS
INTRAVENOUS | Status: AC
  Filled 2020-01-15: qty 40

## 2020-01-15 MED ORDER — GLYCOPYRROLATE 0.2 MG/ML IJ SOLN
INTRAMUSCULAR | Status: AC
  Filled 2020-01-15: qty 1

## 2020-01-15 MED ORDER — DEXAMETHASONE SODIUM PHOSPHATE 10 MG/ML IJ SOLN
INTRAMUSCULAR | Status: AC
  Filled 2020-01-15: qty 1

## 2020-01-15 MED ORDER — SUCCINYLCHOLINE CHLORIDE 20 MG/ML IJ SOLN
INTRAMUSCULAR | Status: DC | PRN
  Administered 2020-01-15: 100 mg via INTRAVENOUS

## 2020-01-15 MED ORDER — THROMBIN 5000 UNITS EX SOLR
CUTANEOUS | Status: DC | PRN
  Administered 2020-01-15: 5000 [IU] via TOPICAL

## 2020-01-15 MED ORDER — BUPIVACAINE LIPOSOME 1.3 % IJ SUSP
INTRAMUSCULAR | Status: AC
  Filled 2020-01-15: qty 20

## 2020-01-15 MED ORDER — FENTANYL CITRATE (PF) 100 MCG/2ML IJ SOLN
INTRAMUSCULAR | Status: AC
  Filled 2020-01-15: qty 2

## 2020-01-15 MED ORDER — PROMETHAZINE HCL 25 MG/ML IJ SOLN: 6.2500 mg | INTRAMUSCULAR | Status: DC | PRN

## 2020-01-15 MED ORDER — FENTANYL CITRATE (PF) 100 MCG/2ML IJ SOLN
INTRAMUSCULAR | Status: DC | PRN
  Administered 2020-01-15 (×4): 50 ug via INTRAVENOUS

## 2020-01-15 MED ORDER — SUGAMMADEX SODIUM 200 MG/2ML IV SOLN
INTRAVENOUS | Status: DC | PRN
  Administered 2020-01-15: 123.4 mg via INTRAVENOUS

## 2020-01-15 MED ORDER — PHENYLEPHRINE HCL-NACL 20-0.9 MG/250ML-% IV SOLN
INTRAVENOUS | Status: DC | PRN
  Administered 2020-01-15: 25 ug/min via INTRAVENOUS

## 2020-01-15 MED ORDER — CHLORHEXIDINE GLUCONATE 0.12 % MT SOLN
OROMUCOSAL | Status: AC
  Filled 2020-01-15: qty 15

## 2020-01-15 MED ORDER — CHLORHEXIDINE GLUCONATE 0.12 % MT SOLN
15.0000 mL | Freq: Once | OROMUCOSAL | Status: AC
  Administered 2020-01-15: 15 mL via OROMUCOSAL

## 2020-01-15 MED ORDER — LIDOCAINE HCL (PF) 2 % IJ SOLN
INTRAMUSCULAR | Status: AC
  Filled 2020-01-15: qty 5

## 2020-01-15 MED ORDER — ONDANSETRON HCL 4 MG/2ML IJ SOLN
INTRAMUSCULAR | Status: AC
  Filled 2020-01-15: qty 2

## 2020-01-15 MED ORDER — SUCCINYLCHOLINE CHLORIDE 200 MG/10ML IV SOSY
PREFILLED_SYRINGE | INTRAVENOUS | Status: AC
  Filled 2020-01-15: qty 10

## 2020-01-15 MED ORDER — ORAL CARE MOUTH RINSE: 15.0000 mL | Freq: Once | OROMUCOSAL | Status: AC

## 2020-01-15 MED ORDER — ACETAMINOPHEN 10 MG/ML IV SOLN
INTRAVENOUS | Status: AC
  Filled 2020-01-15: qty 100

## 2020-01-15 MED ORDER — BUPIVACAINE HCL (PF) 0.5 % IJ SOLN
INTRAMUSCULAR | Status: DC | PRN
  Administered 2020-01-15: 20 mL

## 2020-01-15 MED ORDER — THROMBIN 5000 UNITS EX SOLR
CUTANEOUS | Status: AC
  Filled 2020-01-15: qty 10000

## 2020-01-15 MED ORDER — ACETAMINOPHEN 10 MG/ML IV SOLN
INTRAVENOUS | Status: DC | PRN
  Administered 2020-01-15: 1000 mg via INTRAVENOUS

## 2020-01-15 MED ORDER — METHYLPREDNISOLONE ACETATE 40 MG/ML IJ SUSP
INTRAMUSCULAR | Status: DC | PRN
Start: 2020-01-15 — End: 2020-01-15
  Administered 2020-01-15: 40 mg

## 2020-01-15 MED ORDER — ROCURONIUM BROMIDE 100 MG/10ML IV SOLN
INTRAVENOUS | Status: DC | PRN
  Administered 2020-01-15: 10 mg via INTRAVENOUS
  Administered 2020-01-15: 30 mg via INTRAVENOUS

## 2020-01-15 MED ORDER — MEPERIDINE HCL 50 MG/ML IJ SOLN: 6.2500 mg | INTRAMUSCULAR | Status: DC | PRN

## 2020-01-15 MED ORDER — EPINEPHRINE PF 1 MG/ML IJ SOLN
INTRAMUSCULAR | Status: AC
  Filled 2020-01-15: qty 1

## 2020-01-15 MED ORDER — OXYCODONE HCL 5 MG/5ML PO SOLN: 5.0000 mg | Freq: Once | ORAL | Status: DC | PRN

## 2020-01-15 MED ORDER — MIDAZOLAM HCL 2 MG/2ML IJ SOLN
INTRAMUSCULAR | Status: DC | PRN
  Administered 2020-01-15: 1 mg via INTRAVENOUS

## 2020-01-15 MED ORDER — ONDANSETRON HCL 4 MG/2ML IJ SOLN
INTRAMUSCULAR | Status: DC | PRN
  Administered 2020-01-15: 4 mg via INTRAVENOUS

## 2020-01-15 MED ORDER — LACTATED RINGERS IV SOLN: INTRAVENOUS | Status: DC

## 2020-01-15 MED ORDER — CEFAZOLIN SODIUM-DEXTROSE 2-4 GM/100ML-% IV SOLN
INTRAVENOUS | Status: AC
  Filled 2020-01-15: qty 100

## 2020-01-15 MED ORDER — ROCURONIUM BROMIDE 10 MG/ML (PF) SYRINGE
PREFILLED_SYRINGE | INTRAVENOUS | Status: AC
  Filled 2020-01-15: qty 10

## 2020-01-15 MED ORDER — HEMOSTATIC AGENTS (NO CHARGE) OPTIME
TOPICAL | Status: DC | PRN
  Administered 2020-01-15: 1 via TOPICAL

## 2020-01-15 MED ORDER — OXYCODONE HCL 5 MG PO TABS
5.0000 mg | ORAL_TABLET | ORAL | 0 refills | Status: AC | PRN
Start: 2020-01-15 — End: 2020-01-20

## 2020-01-15 MED ORDER — LIDOCAINE HCL (CARDIAC) PF 100 MG/5ML IV SOSY
PREFILLED_SYRINGE | INTRAVENOUS | Status: DC | PRN
  Administered 2020-01-15: 80 mg via INTRAVENOUS

## 2020-01-15 MED ORDER — DROPERIDOL 2.5 MG/ML IJ SOLN
0.6250 mg | Freq: Once | INTRAMUSCULAR | Status: DC | PRN
  Filled 2020-01-15: qty 2

## 2020-01-15 MED ORDER — PROPOFOL 10 MG/ML IV BOLUS
INTRAVENOUS | Status: DC | PRN
  Administered 2020-01-15: 120 mg via INTRAVENOUS
  Administered 2020-01-15: 80 mg via INTRAVENOUS

## 2020-01-15 MED ORDER — CEFAZOLIN SODIUM-DEXTROSE 2-4 GM/100ML-% IV SOLN
2.0000 g | INTRAVENOUS | Status: AC
  Administered 2020-01-15: 2 g via INTRAVENOUS

## 2020-01-15 MED ORDER — LORAZEPAM 2 MG/ML IJ SOLN: 1.0000 mg | Freq: Once | INTRAMUSCULAR | Status: DC | PRN

## 2020-01-15 MED ORDER — THROMBIN 5000 UNITS EX SOLR
CUTANEOUS | Status: AC
  Filled 2020-01-15: qty 5000

## 2020-01-15 MED ORDER — SODIUM CHLORIDE FLUSH 0.9 % IV SOLN
INTRAVENOUS | Status: AC
  Filled 2020-01-15: qty 20

## 2020-01-15 MED ORDER — MIDAZOLAM HCL 2 MG/2ML IJ SOLN
INTRAMUSCULAR | Status: AC
  Filled 2020-01-15: qty 2

## 2020-01-15 MED ORDER — SODIUM CHLORIDE 0.9 % IV SOLN
INTRAVENOUS | Status: DC | PRN
  Administered 2020-01-15: 40 mL

## 2020-01-15 MED ORDER — METHYLPREDNISOLONE ACETATE 40 MG/ML IJ SUSP
INTRAMUSCULAR | Status: AC
  Filled 2020-01-15: qty 1

## 2020-01-15 MED ORDER — DEXAMETHASONE SODIUM PHOSPHATE 10 MG/ML IJ SOLN
INTRAMUSCULAR | Status: DC | PRN
  Administered 2020-01-15: 10 mg via INTRAVENOUS

## 2020-01-15 SURGICAL SUPPLY — 64 items
ADH SKN CLS APL DERMABOND .7 (GAUZE/BANDAGES/DRESSINGS) ×1
AGENT HMST MTR 8 SURGIFLO (HEMOSTASIS) ×1
APL PRP STRL LF DISP 70% ISPRP (MISCELLANEOUS) ×2
BASKET BONE COLLECTION (BASKET) IMPLANT
BULB RESERV EVAC DRAIN JP 100C (MISCELLANEOUS) IMPLANT
BUR NEURO DRILL SOFT 3.0X3.8M (BURR) ×2 IMPLANT
CHLORAPREP W/TINT 26 (MISCELLANEOUS) ×4 IMPLANT
COUNTER NEEDLE 20/40 LG (NEEDLE) ×2 IMPLANT
COVER WAND RF STERILE (DRAPES) ×2 IMPLANT
CUP MEDICINE 2OZ PLAST GRAD ST (MISCELLANEOUS) ×4 IMPLANT
DERMABOND ADVANCED (GAUZE/BANDAGES/DRESSINGS) ×1
DERMABOND ADVANCED .7 DNX12 (GAUZE/BANDAGES/DRESSINGS) ×1 IMPLANT
DRAIN CHANNEL JP 10F RND 20C F (MISCELLANEOUS) IMPLANT
DRAPE C ARM PK CFD 31 SPINE (DRAPES) ×4 IMPLANT
DRAPE LAPAROTOMY 77X122 PED (DRAPES) ×2 IMPLANT
DRAPE MICROSCOPE SPINE 48X150 (DRAPES) ×2 IMPLANT
DRAPE POUCH INSTRU U-SHP 10X18 (DRAPES) ×2 IMPLANT
DRAPE SURG 17X11 SM STRL (DRAPES) ×8 IMPLANT
DRSG OPSITE POSTOP 4X6 (GAUZE/BANDAGES/DRESSINGS) ×2 IMPLANT
ELECT CAUTERY BLADE TIP 2.5 (TIP) ×2
ELECT REM PT RETURN 9FT ADLT (ELECTROSURGICAL) ×2
ELECTRODE CAUTERY BLDE TIP 2.5 (TIP) ×1 IMPLANT
ELECTRODE REM PT RTRN 9FT ADLT (ELECTROSURGICAL) ×1 IMPLANT
FEE INTRAOP MONITOR IMPULS NCS (MISCELLANEOUS) IMPLANT
GLOVE SURG SYN 7.0 (GLOVE) ×8 IMPLANT
GLOVE SURG SYN 8.5  E (GLOVE) ×4
GLOVE SURG SYN 8.5 E (GLOVE) ×4 IMPLANT
GLOVE SURG UNDER POLY LF SZ7 (GLOVE) ×2 IMPLANT
GOWN SRG XL LVL 3 NONREINFORCE (GOWNS) ×1 IMPLANT
GOWN STRL NON-REIN TWL XL LVL3 (GOWNS) ×2
GOWN STRL REUS W/ TWL XL LVL3 (GOWN DISPOSABLE) ×1 IMPLANT
GOWN STRL REUS W/TWL XL LVL3 (GOWN DISPOSABLE) ×2
GRADUATE 1200CC STRL 31836 (MISCELLANEOUS) ×2 IMPLANT
GRAFT DURAGEN MATRIX 1WX1L (Tissue) IMPLANT
INTRAOP MONITOR FEE IMPULS NCS (MISCELLANEOUS)
INTRAOP MONITOR FEE IMPULSE (MISCELLANEOUS)
KIT TURNOVER KIT A (KITS) ×2 IMPLANT
MANIFOLD NEPTUNE II (INSTRUMENTS) ×2 IMPLANT
MARKER SKIN DUAL TIP RULER LAB (MISCELLANEOUS) ×2 IMPLANT
NDL SAFETY ECLIPSE 18X1.5 (NEEDLE) ×1 IMPLANT
NEEDLE HYPO 18GX1.5 SHARP (NEEDLE) ×2
NEEDLE HYPO 22GX1.5 SAFETY (NEEDLE) ×2 IMPLANT
NS IRRIG 1000ML POUR BTL (IV SOLUTION) ×2 IMPLANT
PACK LAMINECTOMY NEURO (CUSTOM PROCEDURE TRAY) ×2 IMPLANT
PAD ARMBOARD 7.5X6 YLW CONV (MISCELLANEOUS) ×2 IMPLANT
PIN CASPAR 14 (PIN) IMPLANT
PIN CASPAR 14MM (PIN)
PUTTY DBX 1CC (Putty) IMPLANT
SPOGE SURGIFLO 8M (HEMOSTASIS) ×1
SPONGE KITTNER 5P (MISCELLANEOUS) ×2 IMPLANT
SPONGE SURGIFLO 8M (HEMOSTASIS) ×1 IMPLANT
STAPLER SKIN PROX 35W (STAPLE) IMPLANT
SUT V-LOC 90 ABS DVC 3-0 CL (SUTURE) ×4 IMPLANT
SUT VIC AB 0 CT1 27 (SUTURE) ×2
SUT VIC AB 0 CT1 27XCR 8 STRN (SUTURE) ×1 IMPLANT
SUT VIC AB 2-0 CT1 18 (SUTURE) ×2 IMPLANT
SUT VIC AB 3-0 SH 8-18 (SUTURE) IMPLANT
SYR 20ML LL LF (SYRINGE) ×2 IMPLANT
SYR 30ML LL (SYRINGE) ×2 IMPLANT
SYR 3ML 18GX1 1/2 (SYRINGE) ×2 IMPLANT
TAPE CLOTH 3X10 WHT NS LF (GAUZE/BANDAGES/DRESSINGS) ×2 IMPLANT
TOWEL OR 17X26 4PK STRL BLUE (TOWEL DISPOSABLE) ×6 IMPLANT
TRAY FOLEY MTR SLVR 16FR STAT (SET/KITS/TRAYS/PACK) IMPLANT
TUBING CONNECTING 10 (TUBING) ×2 IMPLANT

## 2020-01-15 NOTE — Discharge Instructions (Addendum)
Bupivacaine Liposomal Suspension for Injection What is this medicine? BUPIVACAINE LIPOSOMAL (bue PIV a kane LIP oh som al) is an anesthetic. It causes loss of feeling in the skin or other tissues. It is used to prevent and to treat pain from some procedures. This medicine may be used for other purposes; ask your health care provider or pharmacist if you have questions. COMMON BRAND NAME(S): EXPAREL What should I tell my health care provider before I take this medicine? They need to know if you have any of these conditions:  G6PD deficiency  heart disease  kidney disease  liver disease  low blood pressure  lung or breathing disease, like asthma  an unusual or allergic reaction to bupivacaine, other medicines, foods, dyes, or preservatives  pregnant or trying to get pregnant  breast-feeding How should I use this medicine? This medicine is injected into the affected area. It is given by a health care provider in a hospital or clinic setting. Talk to your health care provider about the use of this medicine in children. While it may be given to children as young as 6 years for selected conditions, precautions do apply. Overdosage: If you think you have taken too much of this medicine contact a poison control center or emergency room at once. NOTE: This medicine is only for you. Do not share this medicine with others. What if I miss a dose? This does not apply. What may interact with this medicine? This medicine may interact with the following medications:  acetaminophen  certain antibiotics like dapsone, nitrofurantoin, aminosalicylic acid, sulfonamides  certain medicines for seizures like phenobarbital, phenytoin, valproic acid  chloroquine  cyclophosphamide  flutamide  hydroxyurea  ifosfamide  metoclopramide  nitric oxide  nitroglycerin  nitroprusside  nitrous oxide  other local anesthetics like lidocaine, pramoxine,  tetracaine  primaquine  quinine  rasburicase  sulfasalazine This list may not describe all possible interactions. Give your health care provider a list of all the medicines, herbs, non-prescription drugs, or dietary supplements you use. Also tell them if you smoke, drink alcohol, or use illegal drugs. Some items may interact with your medicine. What should I watch for while using this medicine? Your condition will be monitored carefully while you are receiving this medicine. Be careful to avoid injury while the area is numb, and you are not aware of pain. What side effects may I notice from receiving this medicine? Side effects that you should report to your doctor or health care professional as soon as possible:  allergic reactions like skin rash, itching or hives, swelling of the face, lips, or tongue  seizures  signs and symptoms of a dangerous change in heartbeat or heart rhythm like chest pain; dizziness; fast, irregular heartbeat; palpitations; feeling faint or lightheaded; falls; breathing problems  signs and symptoms of methemoglobinemia such as pale, gray, or [place] colored skin; headache; fast heartbeat; shortness of breath; feeling faint or lightheaded, falls; tiredness Side effects that usually do not require medical attention (report to your doctor or health care professional if they continue or are bothersome):  anxious  back pain  changes in taste  changes in vision  constipation  dizziness  fever  nausea, vomiting This list may not describe all possible side effects. Call your doctor for medical advice about side effects. You may report side effects to FDA at 1-800-FDA-1088. Where should I keep my medicine? This drug is given in a hospital or clinic and will not be stored at home. NOTE: This sheet is a  summary. It may not cover all possible information. If you have questions about this medicine, talk to your doctor, pharmacist, or health care provider.  2021  Elsevier/Gold Standard (2019-03-26 12:24:57)    AMBULATORY SURGERY  DISCHARGE INSTRUCTIONS   1) The drugs that you were given will stay in your system until tomorrow so for the next 24 hours you should not:  A) Drive an automobile B) Make any legal decisions C) Drink any alcoholic beverage   2) You may resume regular meals tomorrow.  Today it is better to start with liquids and gradually work up to solid foods.  You may eat anything you prefer, but it is better to start with liquids, then soup and crackers, and gradually work up to solid foods.   3) Please notify your doctor immediately if you have any unusual bleeding, trouble breathing, redness and pain at the surgery site, drainage, fever, or pain not relieved by medication. 4)   5) Your post-operative visit with Dr.                                     is: Date:                        Time:    Please call to schedule your post-operative visit.  6) Additional Instructions:      Your surgeon has performed an operation on your lumbar spine (low back) to relieve pressure on one or more nerves. Many times, patients feel better immediately after surgery and can "overdo it." Even if you feel well, it is important that you follow these activity guidelines. If you do not let your back heal properly from the surgery, you can increase the chance of a disc herniation and/or return of your symptoms. The following are instructions to help in your recovery once you have been discharged from the hospital.  * Do not take anti-inflammatory medications for 3 days after surgery (naproxen [Aleve], ibuprofen [Advil, Motrin], celecoxib [Celebrex], etc.)  Activity    No bending, lifting, or twisting ("BLT"). Avoid lifting objects heavier than 10 pounds (gallon milk jug).  Where possible, avoid household activities that involve lifting, bending, pushing, or pulling such as laundry, vacuuming, grocery shopping, and childcare. Try to arrange for  help from friends and family for these activities while your back heals.  Increase physical activity slowly as tolerated.  Taking short walks is encouraged, but avoid strenuous exercise. Do not jog, run, bicycle, lift weights, or participate in any other exercises unless specifically allowed by your doctor. Avoid prolonged sitting, including car rides.  Talk to your doctor before resuming sexual activity.  You should not drive until cleared by your doctor.  Until released by your doctor, you should not return to work or school.  You should rest at home and let your body heal.   You may shower two days after your surgery.  After showering, lightly dab your incision dry. Do not take a tub bath or go swimming for 3 weeks, or until approved by your doctor at your follow-up appointment.  If you smoke, we strongly recommend that you quit.  Smoking has been proven to interfere with normal healing in your back and will dramatically reduce the success rate of your surgery. Please contact [facility] (800-QUIT-NOW) and use the resources at www.[facility].com for assistance in stopping smoking.  Surgical Incision   Please remove  dressing on Monday.  If you have staples or stitches on your incision, you should have a follow up scheduled for removal. If you do not have staples or stitches, you will have steri-strips (small pieces of surgical tape) or Dermabond glue. The steri-strips/glue should begin to peel away within about a week (it is fine if the steri-strips fall off before then). If the strips are still in place one week after your surgery, you may gently remove them.  Diet            You may return to your usual diet. Be sure to stay hydrated.  When to Contact us  Although your surgery and recovery will likely be uneventful, you may have some residual numbness, aches, and pains in your back and/or legs. This is normal and should improve in the next few weeks.  However, should you experience any  of the following, contact us immediately: . New numbness or weakness . Pain that is progressively getting worse, and is not relieved by your pain medications or rest . Bleeding, redness, swelling, pain, or drainage from surgical incision . Chills or flu-like symptoms . Fever greater than 101.0 F (38.3 C) . Problems with bowel or bladder functions . Difficulty breathing or shortness of breath . Warmth, tenderness, or swelling in your calf  Contact Information . During office hours (Monday-Friday 9 am to 5 pm), please call your physician at [phone] . After hours and weekends, please call [phone] and an answering service will put you in touch with either Dr. Adriana Simas or Dr. Myer Haff.  . For a life-threatening emergency, call 911

## 2020-01-15 NOTE — Anesthesia Preprocedure Evaluation (Signed)
Anesthesia Evaluation  Patient identified by MRN, date of birth, ID band Patient awake    Reviewed: Allergy & Precautions, H&P , NPO status , Patient's Chart, lab work & pertinent test results  Airway Mallampati: II       Dental no notable dental hx.    Pulmonary asthma ,    Pulmonary exam normal breath sounds clear to auscultation       Cardiovascular hypertension, negative cardio ROS Normal cardiovascular exam Rhythm:Regular Rate:Normal     Neuro/Psych  Neuromuscular disease negative psych ROS   GI/Hepatic Neg liver ROS, GERD  ,  Endo/Other  negative endocrine ROS  Renal/GU negative Renal ROS  negative genitourinary   Musculoskeletal  (+) Arthritis ,   Abdominal   Peds negative pediatric ROS (+)  Hematology negative hematology ROS (+)   Anesthesia Other Findings Past Medical History: No date: Asthma     Comment:  as a child No date: GERD (gastroesophageal reflux disease) No date: Hypertension   Reproductive/Obstetrics negative OB ROS                             Anesthesia Physical Anesthesia Plan  ASA: II  Anesthesia Plan: General   Post-op Pain Management:    Induction: Intravenous  PONV Risk Score and Plan: 2 and Ondansetron and Dexamethasone  Airway Management Planned: Oral ETT  Additional Equipment:   Intra-op Plan:   Post-operative Plan: Extubation in OR  Informed Consent: I have reviewed the patients History and Physical, chart, labs and discussed the procedure including the risks, benefits and alternatives for the proposed anesthesia with the patient or authorized representative who has indicated his/her understanding and acceptance.     Dental advisory given  Plan Discussed with: CRNA, Anesthesiologist and Surgeon  Anesthesia Plan Comments:         Anesthesia Quick Evaluation

## 2020-01-15 NOTE — Transfer of Care (Signed)
Immediate Anesthesia Transfer of Care Note  Patient: Carlos Neal.  Procedure(s) Performed: L2-3 DECOMPRESSION, RIGHT L2-3 FAR LATERAL DISCECTOMY (Right )  Patient Location: PACU  Anesthesia Type:General  Level of Consciousness: sedated  Airway & Oxygen Therapy: Patient Spontanous Breathing and Patient connected to face mask oxygen  Post-op Assessment: Report given to RN and Post -op Vital signs reviewed and stable  Post vital signs: Reviewed and stable  Last Vitals:  Vitals Value Taken Time  BP    Temp    Pulse    Resp    SpO2      Last Pain:  Vitals:   01/15/20 0646  TempSrc: Oral  PainSc: 3          Complications: No complications documented.

## 2020-01-15 NOTE — OR Nursing (Signed)
Ambulatory, tolerating po's, voided (pacu) per PACU RN; discharged to home.

## 2020-01-15 NOTE — Anesthesia Procedure Notes (Signed)
Procedure Name: Intubation Performed by: Malva Cogan, CRNA Pre-anesthesia Checklist: Patient identified, Patient being monitored, Timeout performed, Emergency Drugs available and Suction available Patient Re-evaluated:Patient Re-evaluated prior to induction Oxygen Delivery Method: Circle system utilized Preoxygenation: Pre-oxygenation with 100% oxygen Induction Type: IV induction Ventilation: Mask ventilation without difficulty Laryngoscope Size: 3 and McGraph Grade View: Grade I Tube type: Oral Tube size: 7.0 mm Number of attempts: 1 Airway Equipment and Method: Stylet and Video-laryngoscopy Placement Confirmation: ETT inserted through vocal cords under direct vision,  positive ETCO2 and breath sounds checked- equal and bilateral Secured at: 23 cm Tube secured with: Tape Dental Injury: Teeth and Oropharynx as per pre-operative assessment

## 2020-01-15 NOTE — H&P (Signed)
I have reviewed and confirmed my history and physical from 12/18/2019 with no additions or changes. Plan for L2-3 decompression and R L2-3 far lateral discectomy.  Risks and benefits reviewed.  Heart sounds normal no MRG. Chest Clear to Auscultation Bilaterally.

## 2020-01-15 NOTE — Anesthesia Postprocedure Evaluation (Signed)
Anesthesia Post Note  Patient: Carlos Neal.  Procedure(s) Performed: L2-3 DECOMPRESSION, RIGHT L2-3 FAR LATERAL DISCECTOMY (Right )  Patient location during evaluation: PACU Anesthesia Type: General Level of consciousness: awake Pain management: pain level controlled Vital Signs Assessment: post-procedure vital signs reviewed and stable Respiratory status: spontaneous breathing Cardiovascular status: blood pressure returned to baseline Postop Assessment: no apparent nausea or vomiting Anesthetic complications: no   No complications documented.   Last Vitals:  Vitals:   01/15/20 1049 01/15/20 1117  BP: (!) 161/101 (!) 151/76  Pulse: 71 77  Resp: 18 16  Temp:  36.7 C  SpO2: 100% 100%    Last Pain:  Vitals:   01/15/20 1117  TempSrc: Temporal  PainSc: 0-No pain                 Emilio Math

## 2020-01-15 NOTE — Op Note (Signed)
Indications: Mr. Carlos Neal is a 80 yo male who presented with lumbar radiculopathy as well as neurogenic claudication.  He failed conservative management prompting surgical intervention.  Findings: stenosis L2-3, far lateral disc herniation L2-3  Preoperative Diagnosis: Lumbar Stenosis with neurogenic claudication, lumbar radiculopathy Postoperative Diagnosis: same   EBL: 25 ml IVF: 800 ml Drains: none Disposition: Extubated and Stable to PACU Complications: none  No foley catheter was placed.   Preoperative Note:   Risks of surgery discussed include: infection, bleeding, stroke, coma, death, paralysis, CSF leak, nerve/spinal cord injury, numbness, tingling, weakness, complex regional pain syndrome, recurrent stenosis and/or disc herniation, vascular injury, development of instability, neck/back pain, need for further surgery, persistent symptoms, development of deformity, and the risks of anesthesia. The patient understood these risks and agreed to proceed.  Operative Note:   1. L2-3 lumbar decompression including central laminectomy and bilateral medial facetectomies including foraminotomies 2. R L2-3 far lateral discectomy  The patient was then brought from the preoperative center with intravenous access established.  The patient underwent general anesthesia and endotracheal tube intubation, and was then rotated on the [place] rail top where all pressure points were appropriately padded.  The skin was then thoroughly cleansed.  Perioperative antibiotic prophylaxis was administered.  Sterile prep and drapes were then applied and a timeout was then observed.  C-arm was brought into the field under sterile conditions and under lateral visualization the L2-3 interspace was identified and marked.  The incision was marked on the right and injected with local anesthetic. Once this was complete a 2 cm incision was opened with the use of a #10 blade knife.    The metrx tubes were sequentially  advanced and confirmed in position at L2-3. An 34mm by 32mm tube was locked in place to the bed side attachment.  The microscope was then sterilely brought into the field and muscle creep was hemostased with a bipolar and resected with a pituitary rongeur.  A Bovie extender was then used to expose the spinous process and lamina.  Careful attention was placed to not violate the facet capsule. A 3 mm matchstick drill bit was then used to make a hemi-laminotomy trough until the ligamentum flavum was exposed.  This was extended to the base of the spinous process and to the contralateral side to remove all the central bone from each side.  Once this was complete and the underlying ligamentum flavum was visualized, it was dissected with a curette and resected with Kerrison rongeurs.  Extensive ligamentum hypertrophy was noted, requiring a substantial amount of time and care for removal.  The dura was identified and palpated. The kerrison rongeur was then used to remove the medial facet bilaterally until no compression was noted.  A balltip probe was used to confirm decompression of the ipsilateral L3 nerve root.  Additional attention was paid to completion of the contralateral L2-3 foraminotomy until the contralateral traversing nerve root was completely free.  Once this was complete, L2-3 central decompression including medial facetectomy and foraminotomy was confirmed and decompression on both sides was confirmed. No CSF leak was noted.  A Depo-Medrol soaked Gelfoam pledget was placed in the defect.  The wound was copiously irrigated. The tube system was then removed under microscopic visualization and hemostasis was obtained with a bipolar.    We then moved to the far lateral discectomy.    C-arm was brought into the field under sterile conditions, and the L2/3 disc space identified and marked with an incision on the right 4cm  lateral to midline.   Once this was complete a 3 cm incision was opened with the  use of a #10 blade knife.  The Metrx tubes were sequentially advanced under lateral fluoroscopy until a 18 x 60 mm Metrx tube was placed over the right L3 pedicle and secured to the bed.    The microscope was then sterilely brought into the field and muscle creep was hemostased with a bipolar and resected with a pituitary rongeur.  A Bovie extender was then used to expose the L3 transverse process and lateral facet.  Careful attention was placed to not violate the facet capsule. A 3 mm matchstick drill bit was then used to drill off the top of the L3 transverse process, lateral L2/3 facet, and lateral pars.  The intertransverse membrane was identified, and carefully dissected free from the caudal transverse process.    Using careful dissected, the superior aspect of the R L3 pedicle was identified and exposed down to its junction with the vertebral body.  The disc was identified. The disc was entered using a small Penfield 4. The L2 nerve root was identified and freed using a Penfield 4 and balltip probe.  The disc herniation was identified and dissected free using a balltip probe. The pituitary rongeur was used to remove the extruded disc fragments. Once the nerve root were noted to be relaxed and under less tension the ball-tipped feeler was passed along the foramen proximally to to ensure no residual compression was noted.    A Depo-Medrol soaked Gelfoam pledget was placed along the nerve root for 2 minutes and removed.  The area was irrigated. The tube system was then removed under microscopic visualization and hemostasis was obtained with a bipolar.    Each incision was closed in the following manner.   The fascial layer was reapproximated with the use of a 0 Vicryl suture.  Subcutaneous tissue layer was reapproximated using 2-0 Vicryl suture.  3-0 monocryl was placed in subcuticular fashion. The skin was then cleansed and Dermabond was used to close the skin opening.  Patient was then rotated back  to the preoperative bed awakened from anesthesia and taken to recovery all counts are correct in this case.  I performed the entire procedure with the assistance of Carlos Berthold NP as an Designer, television/film set.  Adan Baehr K. Myer Haff MD

## 2020-01-15 NOTE — Discharge Summary (Signed)
Procedure: L2-3 decompression and far lateral discectomy Procedure date: 01/15/2020 Diagnosis: lumbar radiculopathy and neurogenic claudication    History: Carlos Kerns. is s/p L2-3 decompression and far lateral discectomy POD0: Tolerated procedure well. Evaluated in post op recovery still disoriented from anesthesia but able to answer questions and obey commands.   Physical Exam: Vitals:   01/15/20 0646  BP: (!) 175/73  Pulse: 71  Resp: 18  Temp: 98 F (36.7 C)  SpO2: 100%    General: Alert and oriented, lying in bed Strength:5/5 throughout  Sensation: intact and symmetric throughout  Skin: incision with honeycomb dressing clean, dry, intact  Data:  No results for input(s): NA, K, CL, CO2, BUN, CREATININE, LABGLOM, GLUCOSE, CALCIUM in the last 168 hours. No results for input(s): AST, ALT, ALKPHOS in the last 168 hours.  Invalid input(s): TBILI   No results for input(s): WBC, HGB, HCT, PLT in the last 168 hours. No results for input(s): APTT, INR in the last 168 hours.       Assessment/Plan:  Carlos Kerns. is POD0 s/p L2-3 decompression and far lateral discectomy. Once he is able to tolerate PO, ambulate, and urinate, he is cleared to discharge to home.  He will follow up in two weeks in clinic.   Carlos Berthold, NP Department of Neurosurgery

## 2020-03-16 DIAGNOSIS — N401 Enlarged prostate with lower urinary tract symptoms: Secondary | ICD-10-CM | POA: Insufficient documentation

## 2020-03-16 DIAGNOSIS — E538 Deficiency of other specified B group vitamins: Secondary | ICD-10-CM | POA: Insufficient documentation

## 2020-03-23 ENCOUNTER — Other Ambulatory Visit: Payer: Self-pay | Admitting: Neurosurgery

## 2020-03-23 DIAGNOSIS — M5416 Radiculopathy, lumbar region: Secondary | ICD-10-CM

## 2020-04-03 ENCOUNTER — Ambulatory Visit
Admission: RE | Admit: 2020-04-03 | Discharge: 2020-04-03 | Disposition: A | Discharge status: Home / Self Care | Payer: Medicare PPO | Source: Ambulatory Visit | Attending: Neurosurgery | Admitting: Neurosurgery

## 2020-04-03 DIAGNOSIS — M5416 Radiculopathy, lumbar region: Secondary | ICD-10-CM | POA: Insufficient documentation

## 2020-04-03 MED ORDER — GADOBUTROL 1 MMOL/ML IV SOLN
6.0000 mL | Freq: Once | INTRAVENOUS | Status: AC | PRN
  Administered 2020-04-03: 6 mL via INTRAVENOUS

## 2020-09-14 ENCOUNTER — Other Ambulatory Visit: Payer: Self-pay

## 2020-09-14 ENCOUNTER — Encounter: Payer: Self-pay | Admitting: Ophthalmology

## 2020-09-20 NOTE — Anesthesia Preprocedure Evaluation (Addendum)
Anesthesia Evaluation  Patient identified by MRN, date of birth, ID band Patient awake    Reviewed: Allergy & Precautions, NPO status , Patient's Chart, lab work & pertinent test results  History of Anesthesia Complications Negative for: history of anesthetic complications  Airway Mallampati: IV   Neck ROM: Full    Dental   Pulmonary asthma ,    Pulmonary exam normal breath sounds clear to auscultation       Cardiovascular hypertension, Normal cardiovascular exam Rhythm:Regular Rate:Normal  ECG 01/07/20: NSR, LAD   Neuro/Psych Chronic lower back and leg pain    GI/Hepatic GERD  ,  Endo/Other  negative endocrine ROS  Renal/GU negative Renal ROS     Musculoskeletal  (+) Arthritis ,   Abdominal   Peds  Hematology  (+) Blood dyscrasia (hereditary hemochromatosis), ,   Anesthesia Other Findings   Reproductive/Obstetrics                            Anesthesia Physical Anesthesia Plan  ASA: 2  Anesthesia Plan: MAC   Post-op Pain Management:    Induction: Intravenous  PONV Risk Score and Plan: 1 and TIVA, Midazolam and Treatment may vary due to age or medical condition  Airway Management Planned: Nasal Cannula  Additional Equipment:   Intra-op Plan:   Post-operative Plan:   Informed Consent: I have reviewed the patients History and Physical, chart, labs and discussed the procedure including the risks, benefits and alternatives for the proposed anesthesia with the patient or authorized representative who has indicated his/her understanding and acceptance.       Plan Discussed with: CRNA  Anesthesia Plan Comments:        Anesthesia Quick Evaluation

## 2020-09-21 NOTE — Discharge Instructions (Signed)

## 2020-09-23 ENCOUNTER — Ambulatory Visit
Admission: RE | Admit: 2020-09-23 | Discharge: 2020-09-23 | Disposition: A | Discharge status: Home / Self Care | Payer: Medicare PPO | Attending: Ophthalmology | Admitting: Ophthalmology

## 2020-09-23 ENCOUNTER — Ambulatory Visit: Payer: Medicare PPO | Admitting: Anesthesiology

## 2020-09-23 ENCOUNTER — Other Ambulatory Visit: Payer: Self-pay

## 2020-09-23 ENCOUNTER — Encounter: Payer: Self-pay | Admitting: Ophthalmology

## 2020-09-23 ENCOUNTER — Encounter
Admission: RE | Disposition: A | Discharge status: Home / Self Care | Payer: Self-pay | Source: Home / Self Care | Attending: Ophthalmology

## 2020-09-23 DIAGNOSIS — H57813 Brow ptosis, bilateral: Secondary | ICD-10-CM | POA: Insufficient documentation

## 2020-09-23 DIAGNOSIS — Z79899 Other long term (current) drug therapy: Secondary | ICD-10-CM | POA: Insufficient documentation

## 2020-09-23 DIAGNOSIS — Z7982 Long term (current) use of aspirin: Secondary | ICD-10-CM | POA: Diagnosis not present

## 2020-09-23 DIAGNOSIS — H02103 Unspecified ectropion of right eye, unspecified eyelid: Secondary | ICD-10-CM | POA: Insufficient documentation

## 2020-09-23 DIAGNOSIS — H02831 Dermatochalasis of right upper eyelid: Secondary | ICD-10-CM | POA: Insufficient documentation

## 2020-09-23 DIAGNOSIS — H02834 Dermatochalasis of left upper eyelid: Secondary | ICD-10-CM | POA: Diagnosis not present

## 2020-09-23 DIAGNOSIS — Z791 Long term (current) use of non-steroidal anti-inflammatories (NSAID): Secondary | ICD-10-CM | POA: Diagnosis not present

## 2020-09-23 DIAGNOSIS — H02403 Unspecified ptosis of bilateral eyelids: Secondary | ICD-10-CM | POA: Diagnosis not present

## 2020-09-23 DIAGNOSIS — H02106 Unspecified ectropion of left eye, unspecified eyelid: Secondary | ICD-10-CM | POA: Insufficient documentation

## 2020-09-23 HISTORY — PX: BROW LIFT: SHX178

## 2020-09-23 HISTORY — DX: Age-related osteoporosis without current pathological fracture: M81.0

## 2020-09-23 SURGERY — BLEPHAROPLASTY
Anesthesia: Monitor Anesthesia Care | Site: Eye | Laterality: Bilateral

## 2020-09-23 MED ORDER — MIDAZOLAM HCL 2 MG/2ML IJ SOLN
INTRAMUSCULAR | Status: DC | PRN
Start: 2020-09-23 — End: 2020-09-23
  Administered 2020-09-23 (×2): 1 mg via INTRAVENOUS

## 2020-09-23 MED ORDER — DEXMEDETOMIDINE (PRECEDEX) IN NS 20 MCG/5ML (4 MCG/ML) IV SYRINGE
PREFILLED_SYRINGE | INTRAVENOUS | Status: DC | PRN
  Administered 2020-09-23 (×3): 5 ug via INTRAVENOUS
  Administered 2020-09-23: 10 ug via INTRAVENOUS
  Administered 2020-09-23: 5 ug via INTRAVENOUS

## 2020-09-23 MED ORDER — ERYTHROMYCIN 5 MG/GM OP OINT
TOPICAL_OINTMENT | OPHTHALMIC | Status: DC | PRN
  Administered 2020-09-23: 1 via OPHTHALMIC

## 2020-09-23 MED ORDER — ALFENTANIL 500 MCG/ML IJ INJ
INJECTION | INTRAVENOUS | Status: DC | PRN
  Administered 2020-09-23: 700 ug via INTRAVENOUS

## 2020-09-23 MED ORDER — TRAMADOL HCL 50 MG PO TABS: ORAL_TABLET | ORAL | 0 refills | Status: DC

## 2020-09-23 MED ORDER — LACTATED RINGERS IV SOLN: INTRAVENOUS | Status: DC

## 2020-09-23 MED ORDER — ACETAMINOPHEN 325 MG PO TABS: 650.0000 mg | ORAL_TABLET | Freq: Once | ORAL | Status: DC | PRN

## 2020-09-23 MED ORDER — BSS IO SOLN
INTRAOCULAR | Status: DC | PRN
  Administered 2020-09-23: 15 mL

## 2020-09-23 MED ORDER — LIDOCAINE-EPINEPHRINE 2 %-1:100000 IJ SOLN
INTRAMUSCULAR | Status: DC | PRN
  Administered 2020-09-23: 8 mL via OPHTHALMIC
  Administered 2020-09-23: .5 mL via OPHTHALMIC
  Administered 2020-09-23: 1 mL via OPHTHALMIC
  Administered 2020-09-23: .5 mL via OPHTHALMIC

## 2020-09-23 MED ORDER — ERYTHROMYCIN 5 MG/GM OP OINT
TOPICAL_OINTMENT | OPHTHALMIC | 2 refills | Status: DC
Start: 2020-09-23 — End: 2021-01-16

## 2020-09-23 MED ORDER — LIDOCAINE HCL (CARDIAC) PF 100 MG/5ML IV SOSY
PREFILLED_SYRINGE | INTRAVENOUS | Status: DC | PRN
  Administered 2020-09-23: 60 mg via INTRAVENOUS

## 2020-09-23 MED ORDER — TETRACAINE HCL 0.5 % OP SOLN
OPHTHALMIC | Status: DC | PRN
  Administered 2020-09-23: 2 [drp] via OPHTHALMIC

## 2020-09-23 MED ORDER — ONDANSETRON HCL 4 MG/2ML IJ SOLN: 4.0000 mg | Freq: Once | INTRAMUSCULAR | Status: DC | PRN

## 2020-09-23 MED ORDER — PROPOFOL 500 MG/50ML IV EMUL
INTRAVENOUS | Status: DC | PRN
  Administered 2020-09-23: 75 ug/kg/min via INTRAVENOUS

## 2020-09-23 MED ORDER — ACETAMINOPHEN 160 MG/5ML PO SOLN: 325.0000 mg | ORAL | Status: DC | PRN

## 2020-09-23 SURGICAL SUPPLY — 35 items
APPLICATOR COTTON TIP WD 3 STR (MISCELLANEOUS) ×2 IMPLANT
BLADE SURG 15 STRL LF DISP TIS (BLADE) ×1 IMPLANT
BLADE SURG 15 STRL SS (BLADE) ×2
CORD BIP STRL DISP 12FT (MISCELLANEOUS) ×2 IMPLANT
GAUZE SPONGE 4X4 12PLY STRL (GAUZE/BANDAGES/DRESSINGS) ×2 IMPLANT
GLOVE SURG LX 7.0 MICRO (GLOVE) ×2
GLOVE SURG LX STRL 7.0 MICRO (GLOVE) ×2 IMPLANT
GOWN STRL REUS W/ TWL LRG LVL3 (GOWN DISPOSABLE) ×1 IMPLANT
GOWN STRL REUS W/TWL LRG LVL3 (GOWN DISPOSABLE) ×2
MARKER SKIN XFINE TIP W/RULER (MISCELLANEOUS) ×2 IMPLANT
NEEDLE FILTER BLUNT 18X 1/2SAF (NEEDLE) ×1
NEEDLE FILTER BLUNT 18X1 1/2 (NEEDLE) ×1 IMPLANT
NEEDLE HYPO 30X.5 LL (NEEDLE) ×4 IMPLANT
PACK ENT CUSTOM (PACKS) ×2 IMPLANT
SOL PREP PVP 2OZ (MISCELLANEOUS) ×2
SOLUTION PREP PVP 2OZ (MISCELLANEOUS) ×1 IMPLANT
SPONGE GAUZE 2X2 8PLY STRL LF (GAUZE/BANDAGES/DRESSINGS) ×20 IMPLANT
SUT CHROMIC 4-0 (SUTURE)
SUT CHROMIC 4-0 M2 12X2 ARM (SUTURE)
SUT CHROMIC 5 0 P 3 (SUTURE) ×2 IMPLANT
SUT ETHILON 4 0 CL P 3 (SUTURE) IMPLANT
SUT GUT PLAIN 6-0 1X18 ABS (SUTURE) ×4 IMPLANT
SUT MERSILENE 4-0 S-2 (SUTURE) ×4 IMPLANT
SUT PROLENE 5 0 P 3 (SUTURE) ×2 IMPLANT
SUT PROLENE 6 0 P 1 18 (SUTURE) ×4 IMPLANT
SUT SILK 4 0 G 3 (SUTURE) IMPLANT
SUT VIC AB 5-0 P-3 18X BRD (SUTURE) IMPLANT
SUT VIC AB 5-0 P3 18 (SUTURE)
SUT VICRYL 6-0  S14 CTD (SUTURE)
SUT VICRYL 6-0 S14 CTD (SUTURE) IMPLANT
SUT VICRYL 7 0 TG140 8 (SUTURE) IMPLANT
SUTURE CHRMC 4-0 M2 12X2 ARM (SUTURE) IMPLANT
SYR 10ML LL (SYRINGE) ×2 IMPLANT
SYR 3ML LL SCALE MARK (SYRINGE) ×2 IMPLANT
WATER STERILE IRR 250ML POUR (IV SOLUTION) ×2 IMPLANT

## 2020-09-23 NOTE — Transfer of Care (Signed)
Immediate Anesthesia Transfer of Care Note  Patient: Carlos Neal.  Procedure(s) Performed: BLEPHAROPTOSIS REPAIR; RESECT EX  BROW PTOSIS REPAIR ECTROPION REPAIR, EXTENSIVE BILATERAL (Bilateral: Eye)  Patient Location: PACU  Anesthesia Type: MAC  Level of Consciousness: awake, alert  and patient cooperative  Airway and Oxygen Therapy: Patient Spontanous Breathing and Patient connected to supplemental oxygen  Post-op Assessment: Post-op Vital signs reviewed, Patient's Cardiovascular Status Stable, Respiratory Function Stable, Patent Airway and No signs of Nausea or vomiting  Post-op Vital Signs: Reviewed and stable  Complications: No notable events documented.

## 2020-09-23 NOTE — H&P (Signed)
[place]  Primary Care Physician:  Dion Body, MD Ophthalmologist: Dr. Philis Pique. Vickki Muff, M.D.  Pre-Procedure History & Physical: HPI:  Carlos Neal. is a 80 y.o. male here for periocular surgery.   Past Medical History:  Diagnosis Date   Asthma    as a child   GERD (gastroesophageal reflux disease)    Hypertension    Osteoarthritis    back and knee   Osteoporosis    Vitamin D deficiency     Past Surgical History:  Procedure Laterality Date   COLONOSCOPY     HYDROCELE EXCISION     age 41   INGUINAL HERNIA REPAIR     as a child   LUMBAR LAMINECTOMY/ DECOMPRESSION WITH MET-RX Right 01/15/2020   Procedure: L2-3 DECOMPRESSION, RIGHT L2-3 FAR LATERAL DISCECTOMY;  Surgeon: Meade Maw, MD;  Location: [facility] ORS;  Service: Neurosurgery;  Laterality: Right;  2nd case    Prior to Admission medications   Medication Sig Start Date End Date Taking? Authorizing Provider  acetaminophen (TYLENOL) 500 MG tablet Take 1,000 mg by mouth 3 (three) times daily.   Yes [provider]  aspirin EC 81 MG tablet Take 81 mg by mouth daily. Swallow whole./ am   Yes [provider]  brimonidine (ALPHAGAN P) 0.1 % SOLN Place 1 drop into both eyes in the morning and at bedtime.   Yes [provider]  Calcium Carbonate-Vit D-Min (CALTRATE 600+D PLUS MINERALS) 600-800 MG-UNIT TABS Take 1 tablet by mouth daily.   Yes [provider]  diclofenac Sodium (VOLTAREN) 1 % GEL Apply 1 application topically 4 (four) times daily as needed (pain).   Yes [provider]  gabapentin (NEURONTIN) 300 MG capsule Take 300 mg by mouth at bedtime. 1 in am, 1 afternoon, 2 ar night   Yes [provider]  hydrochlorothiazide (HYDRODIURIL) 25 MG tablet Take 25 mg by mouth daily. am   Yes [provider]  losartan (COZAAR) 25 MG tablet Take 25 mg by mouth at bedtime.   Yes [provider]  Melatonin 10 MG CAPS Take 10 mg  by mouth at bedtime as needed (sleep).   Yes [provider]  Misc Natural Products (OSTEO BI-FLEX JOINT SHIELD) TABS Take by mouth.   Yes [provider]  Multiple Vitamins-Minerals (PRESERVISION AREDS 2) CAPS Take 1 capsule by mouth daily.   Yes [provider]  neomycin-polymyxin-dexameth (MAXITROL) 0.1 % OINT 1 application.   Yes [provider]  omeprazole (PRILOSEC) 20 MG capsule Take 20 mg by mouth daily.   Yes [provider]  OXYCODONE HCL PO Take 5 mg by mouth as needed.   Yes [provider]  RIBOFLAVIN PO Take 1 tablet by mouth daily.   Yes [provider]  tamsulosin (FLOMAX) 0.4 MG CAPS capsule Take 0.4 mg by mouth.   Yes [provider]  traZODone (DESYREL) 50 MG tablet Take 50 mg by mouth at bedtime as needed for sleep.   Yes [provider]  vitamin B-12 (CYANOCOBALAMIN) 1000 MCG tablet Take 1,000 mcg by mouth daily.   Yes [provider]  Zoledronic Acid (RECLAST IV) Inject into the vein. Once per year   Yes [provider]    Allergies as of 08/17/2020   (No Known Allergies)    History reviewed. No pertinent family history.  Social History   Socioeconomic History   Marital status: Married    Spouse name: Holley Raring   Number  of children: Not on file   Years of education: Not on file   Highest education level: Not on file  Occupational History   Not on file  Tobacco Use   Smoking status: Never   Smokeless tobacco: Never  Vaping Use   Vaping Use: Never used  Substance and Sexual Activity   Alcohol use: Yes    Alcohol/week: 3.0 standard drinks    Types: 3 Glasses of wine per week    Comment: occasional   Drug use: Never   Sexual activity: Not on file  Other Topics Concern   Not on file  Social History Narrative   Not on file   Social Determinants of Health   Financial Resource Strain: Not on file  Food Insecurity: Not on file  Transportation Needs: Not on  file  Physical Activity: Not on file  Stress: Not on file  Social Connections: Not on file  Intimate Partner Violence: Not on file    Review of Systems: See HPI, otherwise negative ROS  Physical Exam: BP (!) 174/69   Pulse 69   Temp 97.7 F (36.5 C) (Temporal)   Resp 16   Ht _0  (1.676 m)   Wt 62.6 kg   SpO2 100%   BMI 22.27 kg/m  General:   Alert and cooperative in NAD Head:  Normocephalic and atraumatic. Respiratory:  Normal work of breathing.  Impression/Plan: Carlos Pronto. is here for periocular surgery.  Risks, benefits, limitations, and alternatives regarding surgery have been reviewed with the patient.  Questions have been answered.  All parties agreeable.   Karle Starch, MD  09/23/2020, 10:29 AM

## 2020-09-23 NOTE — Op Note (Signed)
Preoperative Diagnosis:   1.  Visually significant bilateral  brow ptosis.  2.  Visually significant blepharoptosis bilateral  Upper Eyelid(s) 3.  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  Visually significant dermatochalasis bilateral  Upper Eyelid(s)  Postoperative Diagnosis: Same.   Procedure(s) Performed:   1. bilateral  Direct brow lift to improve vision.  2.  Blepharoptosis repair with levator aponeurosis advancement bilateral  Upper Eyelid(s) 3.  Bilateral canthoplasty 4.  Upper eyelid blepharoplasty with excess skin excision  bilateral  Upper Eyelid(s)  Surgeon: Philis Pique. Vickki Muff, M.D.   Assistants: None   Anesthesia: MAC  Specimens: None.  Estimated Blood Loss: Minimal.  Complications: None.  Operative Findings: None Dictated  PROCEDURE:   Allergies were reviewed and the patient has No Known Allergies..   After the risks, benefits, complications and alternatives were discussed with the patient, appropriate informed consent was obtained. While seated in an upright position and looking in primary gaze, the amount of supra-brow skin to be removed was measured and marked in an elliptical pattern. The patient was then brought to the operating suite and reclined supine.  Timeout was conducted and the patient was sedated. Local anesthetic consisting of a 50-50 mixture of 2% lidocaine with epinephrine and 0.75% bupivacaine with added Hylenex was injected subcutaneously to the bilateral  brow region(s),  subcutaneously to both  upper and lower eyelid(s), and down to the periosteum of the lateral orbital rims and brow regions. After adequate local was instilled, the patient was prepped and draped in the usual sterile fashion for eyelid surgery.   Attention was turned to the right brow region. A #15 blade was used to create a bevelled incision along the premarked incision line. A skin and subcutaneous tissue flap was then excised and hemostasis was obtained with bipolar  cautery. The deep tissues were reapproximated with interrupted vertical 5-0 chromic sutures. The skin margin was reapproximated with a running locking 5-0 Prolene suture. Attention was then turned to the opposite brow region where the same procedure was performed in the same manner.    Attention was turned to the right lateral canthus.  A #15 blade was used to make a stab incision over the lateral orbital rim down to the periosteum.  A double-armed 4-0 Mersilene suture was then used to grab the lateral canthal tendon.  Both arms were sutured to the periosteum of the inner portion of the lateral orbital rim and advanced to tighten and pull the whole canthus laterally.  Once the suture was secured orbicularis and skin were closed with interrupted 6-0 plain gut sutures.  Attention was turned to the opposite lateral canthus for the same procedure was performed in the same manner.  Attention was then turned to the upper eyelids. A 85m upper eyelid crease incision line was marked with calipers on both  upper eyelid(s).  A pinch test was used to estimate the amount of excess skin to remove and this was marked in standard blepharoplasty style fashion. Attention was turned to the right  upper eyelid. A #15 blade was used to open the premarked incision line. A Skin and muscle flap was excised and hemostasis was obtained with bipolar cautery.   A buttonhole was created medially in orbicularis and orbital septum to reveal the medial fat pocket. This was dissected free from fascial attachments, cauterized towards the pedicle base and excised to produce a nice flattening of the medial corner of the upper eyelid.  Westcott scissors were then used to transect through orbicularis  for the length of the incision down to the tarsal plate. Epitarsus was dissected to create a smooth surface to suture to. Dissection was then carried superiorly in the plane between orbicularis and orbital septum. Once the preaponeurotic fat pocket  was identified, the orbital septum was opened. This revealed the levator and its aponeurosis.    Attention was then turned to the opposite eyelid where the same procedure was performed in the same manner.   3 interrupted 6-0 Prolene sutures were then passed partial thickness through the tarsal plates of both  upper eyelid(s). These sutures were placed in line with the mid pupillary, medial limbal, and lateral limbal lines. The sutures were fixed to the levator aponeurosis and adjusted until a nice lid height and contour were achieved. Once nice symmetry was achieved, the skin incisions were closed with a combination of interrupted and  running 6-0 plain gut suture.   The patient tolerated the procedure well. Erythromycin ophthalmic ointment was applied to the incision site(s) followed by ice packs.The patient was taken to the recovery area where he recovered without difficulty.  Post-Op Plan/Instructions:   The patient was instructed to use ice packs frequently for the next 48 hours. he was instructed to use Erythromycin ophthalmic ointment on his incisions 4 times a day for the next 12 to 14 days. he was given a prescription for tramadol (or similar) for pain control should Tylenol not be effective. he was asked to to follow up in 10-12 days time for suture removal or sooner as needed for problems.  Stephanye Finnicum M. Vickki Muff, M.D. Ophthalmology

## 2020-09-23 NOTE — Anesthesia Postprocedure Evaluation (Signed)
Anesthesia Post Note  Patient: Carlos Neal.  Procedure(s) Performed: BLEPHAROPTOSIS REPAIR; RESECT EX  BROW PTOSIS REPAIR ECTROPION REPAIR, EXTENSIVE BILATERAL (Bilateral: Eye)     Patient location during evaluation: PACU Anesthesia Type: MAC Level of consciousness: awake and alert, oriented and patient cooperative Pain management: pain level controlled Vital Signs Assessment: post-procedure vital signs reviewed and stable Respiratory status: spontaneous breathing, nonlabored ventilation and respiratory function stable Cardiovascular status: blood pressure returned to baseline and stable Postop Assessment: adequate PO intake Anesthetic complications: no   No notable events documented.  Darrin Nipper

## 2020-09-23 NOTE — Anesthesia Procedure Notes (Signed)
Date/Time: 09/23/2020 10:48 AM Performed by: Lily Kocher, CRNA Pre-anesthesia Checklist: Patient identified, Emergency Drugs available, Suction available, Patient being monitored and Timeout performed Patient Re-evaluated:Patient Re-evaluated prior to induction Oxygen Delivery Method: Nasal cannula Placement Confirmation: positive ETCO2

## 2020-09-23 NOTE — Interval H&P Note (Signed)
History and Physical Interval Note:  09/23/2020 10:29 AM  Carlos Neal.  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  The various methods of treatment have been discussed with the patient and family. After consideration of risks, benefits and other options for treatment, the patient has consented to  Procedure(s) with comments: BLEPHAROPTOSIS REPAIR; RESECT EX  BROW PTOSIS REPAIR ECTROPION REPAIR, EXTENSIVE BILATERAL (Bilateral) - wants to be later in the schedule as a surgical intervention.  The patient's history has been reviewed, patient examined, no change in status, stable for surgery.  I have reviewed the patient's chart and labs.  Questions were answered to the patient's satisfaction.     Ether Griffins, Makinsey Pepitone M

## 2020-09-26 ENCOUNTER — Encounter: Payer: Self-pay | Admitting: Ophthalmology

## 2020-11-02 DIAGNOSIS — M1711 Unilateral primary osteoarthritis, right knee: Secondary | ICD-10-CM | POA: Insufficient documentation

## 2021-01-15 NOTE — Discharge Instructions (Signed)
Instructions after Total Knee Replacement   Dameir Gentzler P. Kiante Ciavarella, Jr., M.D.     Dept. of Orthopaedics & Sports Medicine  [facility]  [address]  [place], [place]  [address]  Phone: [phone]   Fax: [phone]    DIET: Drink plenty of non-alcoholic fluids. Resume your normal diet. Include foods high in fiber.  ACTIVITY:  You may use crutches or a walker with weight-bearing as tolerated, unless instructed otherwise. You may be weaned off of the walker or crutches by your Physical Therapist.  Do NOT place pillows under the knee. Anything placed under the knee could limit your ability to straighten the knee.   Continue doing gentle exercises. Exercising will reduce the pain and swelling, increase motion, and prevent muscle weakness.   Please continue to use the TED compression stockings for 6 weeks. You may remove the stockings at night, but should reapply them in the morning. Do not drive or operate any equipment until instructed.  WOUND CARE:  Continue to use the PolarCare or ice packs periodically to reduce pain and swelling. You may bathe or shower after the staples are removed at the first office visit following surgery.  MEDICATIONS: You may resume your regular medications. Please take the pain medication as prescribed on the medication. Do not take pain medication on an empty stomach. You have been given a prescription for a blood thinner (Lovenox or Coumadin). Please take the medication as instructed. (NOTE: After completing a 2 week course of Lovenox, take one Enteric-coated aspirin once a day. This along with elevation will help reduce the possibility of phlebitis in your operated leg.) Do not drive or drink alcoholic beverages when taking pain medications.  CALL THE OFFICE FOR: Temperature above 101 degrees Excessive bleeding or drainage on the dressing. Excessive swelling, coldness, or paleness of the toes. Persistent nausea and vomiting.  FOLLOW-UP:  You  should have an appointment to return to the office in 10-14 days after surgery. Arrangements have been made for continuation of Physical Therapy (either home therapy or outpatient therapy).   [facility] Department Directory         [email or web address]       [email or web address]          Cardiology  Appointments: [place] - [phone] [place] - [phone]  Endocrinology  Appointments: [place] - [phone] [place] - [phone]  Gastroenterology  Appointments: [place] - [phone] [place] - [phone]        General Surgery   Appointments: [place] - [phone]  Internal Medicine/Family Medicine  Appointments: [place] - [phone] [place] - [phone] [place] - [phone]  Metabolic and Weigh Loss Surgery  Appointments: [place] - [phone]        Neurology  Appointments: [place] - [phone] [place] - [phone]  Neurosurgery  Appointments: [place] - [phone]  Obstetrics & Gynecology  Appointments: [place] - [phone] [place] - [phone]        Pediatrics  Appointments: [place] - [phone] [place] - [phone]  Physiatry  Appointments: [place] -[phone]  Physical Therapy  Appointments: [place] - [phone] [place] - [phone]        Podiatry  Appointments: [place] - [phone] [place] - [phone]  Pulmonology  Appointments: [place] - [phone]  Rheumatology  Appointments: [place] - [phone]        [place] Location: [facility]  [address] , [place]  [address]  [place] Location: [facility] [address] [place], [place]  [address]  [place] Location: [facility] [address] [place], [place]  [address]    

## 2021-01-16 ENCOUNTER — Other Ambulatory Visit: Payer: Self-pay

## 2021-01-16 ENCOUNTER — Encounter
Admission: RE | Admit: 2021-01-16 | Discharge: 2021-01-16 | Disposition: A | Discharge status: Home / Self Care | Payer: Medicare PPO | Source: Ambulatory Visit | Attending: Orthopedic Surgery | Admitting: Orthopedic Surgery

## 2021-01-16 VITALS — BP 145/70 | HR 75 | Temp 98.3°F | Resp 16 | Ht 66.0 in | Wt 139.0 lb

## 2021-01-16 DIAGNOSIS — M1711 Unilateral primary osteoarthritis, right knee: Secondary | ICD-10-CM | POA: Diagnosis not present

## 2021-01-16 DIAGNOSIS — Z01812 Encounter for preprocedural laboratory examination: Secondary | ICD-10-CM

## 2021-01-16 DIAGNOSIS — Z01818 Encounter for other preprocedural examination: Secondary | ICD-10-CM | POA: Insufficient documentation

## 2021-01-16 HISTORY — DX: Other specified disorders of bone density and structure, unspecified site: M85.80

## 2021-01-16 LAB — URINALYSIS, ROUTINE W REFLEX MICROSCOPIC
Bilirubin Urine: NEGATIVE
Glucose, UA: NEGATIVE mg/dL
Hgb urine dipstick: NEGATIVE
Ketones, ur: 5 mg/dL — AB
Leukocytes,Ua: NEGATIVE
Nitrite: NEGATIVE
Protein, ur: NEGATIVE mg/dL
Specific Gravity, Urine: 1.03 (ref 1.005–1.030)
pH: 5 (ref 5.0–8.0)

## 2021-01-16 LAB — TYPE AND SCREEN
ABO/RH(D): B POS
Antibody Screen: NEGATIVE

## 2021-01-16 LAB — SURGICAL PCR SCREEN
MRSA, PCR: NEGATIVE
Staphylococcus aureus: NEGATIVE

## 2021-01-16 LAB — SEDIMENTATION RATE: Sed Rate: 11 mm/hr (ref 0–20)

## 2021-01-16 LAB — C-REACTIVE PROTEIN: CRP: 1.4 mg/dL — ABNORMAL HIGH (ref <1.0)

## 2021-01-16 NOTE — Patient Instructions (Addendum)
Your procedure is scheduled on: Monday, January 30 Report to the [facility]; second floor; surgery information desk To find out your arrival time, please call [phone] between 1PM - 3PM on: Friday, January 27  REMEMBER: Instructions that are not followed completely may result in serious medical risk, up to and including death; or upon the discretion of your surgeon and anesthesiologist your surgery may need to be rescheduled.  Do not eat food after midnight the night before surgery.  No gum chewing, lozengers or hard candies.  You may however, drink CLEAR liquids up to 2 hours before you are scheduled to arrive for your surgery. Do not drink anything within 2 hours of your scheduled arrival time.  Clear liquids include: - water  - apple juice without pulp - gatorade (not RED, PURPLE, OR Menor) - black coffee or tea (Do NOT add milk or creamers to the coffee or tea) Do NOT drink anything that is not on this list.  In addition, your doctor has ordered for you to drink the provided  Ensure Pre-Surgery Clear Carbohydrate Drink  Drinking this carbohydrate drink up to two hours before surgery helps to reduce insulin resistance and improve patient outcomes. Please complete drinking 2 hours prior to scheduled arrival time.  TAKE THESE MEDICATIONS THE MORNING OF SURGERY WITH A SIP OF WATER:  Brimonidine (Alphagan) eye drops Omeprazole (Prilosec) - (take one the night before and one on the morning of surgery - helps to prevent nausea after surgery.)  One week prior to surgery: starting January 23 Stop aspirin and meloxicam and Anti-inflammatories (NSAIDS) such as Advil, Aleve, Ibuprofen, Motrin, Naproxen, Naprosyn and Aspirin based products such as Excedrin, Goodys Powder, BC Powder. Stop ANY OVER THE COUNTER supplements until after surgery. Stop prevagen, calcium, vitamin D, melatonin, osteo bi-flex, preservision areds, riboflavin You may however, continue to take Tylenol if needed for  pain up until the day of surgery.  No Alcohol for 24 hours before or after surgery.  No Smoking including e-cigarettes for 24 hours prior to surgery.  No chewable tobacco products for at least 6 hours prior to surgery.  No nicotine patches on the day of surgery.  Do not use any "recreational" drugs for at least a week prior to your surgery.  Please be advised that the combination of cocaine and anesthesia may have negative outcomes, up to and including death. If you test positive for cocaine, your surgery will be cancelled.  On the morning of surgery brush your teeth with toothpaste and water, you may rinse your mouth with mouthwash if you wish. Do not swallow any toothpaste or mouthwash.  Use CHG Soap as directed on instruction sheet.  Do not wear jewelry.  Do not wear lotions, powders, or perfumes.   Do not shave body from the neck down 48 hours prior to surgery just in case you cut yourself which could leave a site for infection.  Also, freshly shaved skin may become irritated if using the CHG soap.  Contact lenses, hearing aids and dentures may not be worn into surgery.  Do not bring valuables to the hospital. [facility] is not responsible for any missing/lost belongings or valuables.   Notify your doctor if there is any change in your medical condition (cold, fever, infection).  Wear comfortable clothing (specific to your surgery type) to the hospital.  After surgery, you can help prevent lung complications by doing breathing exercises.  Take deep breaths and cough every 1-2 hours. Your doctor may order a  device called an Incentive Spirometer to help you take deep breaths.  If you are being admitted to the hospital overnight, leave your suitcase in the car. After surgery it may be brought to your room.  If you are being discharged the day of surgery, you will not be allowed to drive home. You will need a responsible adult (18 years or older) to drive you home and stay  with you that night.   If you are taking public transportation, you will need to have a responsible adult (18 years or older) with you. Please confirm with your physician that it is acceptable to use public transportation.   Please call the Pre-admissions Testing Dept. at [phone] if you have any questions about these instructions.  Surgery Visitation Policy:  Patients undergoing a surgery or procedure may have one family member or support person with them as long as that person is not COVID-19 positive or experiencing its symptoms.  That person may remain in the waiting area during the procedure and may rotate out with other people.  Inpatient Visitation:    Visiting hours are 7 a.m. to 8 p.m. Up to two visitors ages 16+ are allowed at one time in a patient room. The visitors may rotate out with other people during the day. Visitors must check out when they leave, or other visitors will not be allowed. One designated support person may remain overnight. The visitor must pass COVID-19 screenings, use hand sanitizer when entering and exiting the patients room and wear a mask at all times, including in the patients room. Patients must also wear a mask when staff or their visitor are in the room. Masking is required regardless of vaccination status.

## 2021-01-27 ENCOUNTER — Other Ambulatory Visit
Admission: RE | Admit: 2021-01-27 | Discharge: 2021-01-27 | Disposition: A | Discharge status: Home / Self Care | Payer: Medicare PPO | Source: Ambulatory Visit | Attending: Orthopedic Surgery | Admitting: Orthopedic Surgery

## 2021-01-27 ENCOUNTER — Other Ambulatory Visit: Payer: Self-pay

## 2021-01-27 DIAGNOSIS — Z01812 Encounter for preprocedural laboratory examination: Secondary | ICD-10-CM | POA: Diagnosis present

## 2021-01-27 DIAGNOSIS — Z20822 Contact with and (suspected) exposure to covid-19: Secondary | ICD-10-CM | POA: Insufficient documentation

## 2021-01-27 LAB — SARS CORONAVIRUS 2 (TAT 6-24 HRS): SARS Coronavirus 2: NEGATIVE

## 2021-01-29 ENCOUNTER — Encounter: Payer: Self-pay | Admitting: Orthopedic Surgery

## 2021-01-29 NOTE — H&P (Signed)
ORTHOPAEDIC HISTORY & PHYSICAL Gwenlyn Fudge, [place] - 01/20/2021 1:45 PM EST Formatting of this note is different from the original. [facility] Chief Complaint:   Chief Complaint  Patient presents with   Knee Pain  H & P RIGHT KNEE   History of Present Illness:   Carlos Neal. is a 81 y.o. male that presents to clinic today for his preoperative history and evaluation. Patient presents with his wife. The patient is scheduled to undergo a right total knee arthroplasty on 01/30/21 by Dr. Marry Guan. His pain began many years ago but is increased over the last 6 months. The pain is located primarily along the medial aspect of the knee. He describes his pain as worse with going up and down stairs, standing, and walking. He reports associated swelling with some giving way of the knee. He denies associated numbness or tingling, denies locking.   The patient's symptoms have progressed to the point that they decrease his quality of life. The patient has previously undergone conservative treatment including NSAIDS and injections to the knee without adequate control of his symptoms.  Denies significant cardiac history, denies history of blood clots. He has previously undergone a decompression of L2-L3 but has no lumbar hardware.   No known drug allergies.   Past Medical, Surgical, Family, Social History, Allergies, Medications:   Past Medical History:  Past Medical History:  Diagnosis Date   GERD (gastroesophageal reflux disease)   Hemochromatosis  (Ferritin 20, Hgb 15.2 - 04/2013)   Hyperlipidemia   Hypertension   Impotence   Insomnia   OA (osteoarthritis)   Osteopenia  (dexa 11/02/14)   Vitamin D deficiency   Past Surgical History:  Past Surgical History:  Procedure Laterality Date   COLONOSCOPY 01/12/2013  diverticulosis per Dr. Candace Cruise (no repeat due to age)   L2-3 DECOMPRESSION, RIGHT L2-3 FAR LATERAL DISCECTOMY Right 01/15/2020  Dr.  Meade Maw at [facility],   EYELID SURGERY Bilateral 09/23/2020  Dr Norlene Duel   Bilateral left inguinal herniorrhaphy x 2   Current Medications:  Current Outpatient Medications  Medication Sig Dispense Refill   acetaminophen (TYLENOL) 500 MG tablet Take 1,000 mg by mouth every 8 (eight) hours as needed for Pain   ALPHAGAN P 0.1 % ophthalmic solution Place 1 drop into both eyes 2 (two) times daily   apoaequorin (PREVAGEN) capsule Take 1 capsule by mouth once daily   aspirin 81 MG EC tablet Take 81 mg by mouth once daily.   calcium carbonate (CALCIUM 600 ORAL) Take by mouth once daily   camphor-methyl salicyl-menthoL 123XX123 % PtMd Place onto the skin as needed   carboxymethylcellulose (REFRESH PLUS) 0.5 % ophthalmic solution Place 1 drop into both eyes once daily as needed   cholecalciferol (VITAMIN D3) 1000 unit tablet Take 1,000 Units by mouth once daily   cyanocobalamin (VITAMIN B12) 1000 MCG tablet Take 1 tablet (1,000 mcg total) by mouth once daily 30 tablet 11   diclofenac (VOLTAREN) 1 % topical gel Apply 4 g topically 4 (four) times daily as needed.   glucosam/chon-msm1/C/mang/bosw (OSTEO BI-FLEX TRIPLE STRENGTH ORAL) Take 1 tablet by mouth once daily   hydroCHLOROthiazide (HYDRODIURIL) 25 MG tablet TAKE ONE TABLET BY MOUTH EVERY DAY 90 tablet 1   losartan (COZAAR) 25 MG tablet TAKE 1 TABLET BY MOUTH AT BEDTIME 90 tablet 1   melatonin 10 mg Tab Take 10 mg by mouth at bedtime as needed   meloxicam (MOBIC) 15 MG tablet Take  1 tablet (15 mg total) by mouth once daily   neomycin-polymyxin-dexAMETHasone (MAXITROL) ophthalmic ointment Place 1 Application into the left eye as needed   omeprazole (PRILOSEC) 20 MG DR capsule Take 1 capsule (20 mg total) by mouth once daily   oxyCODONE (ROXICODONE) 5 MG immediate release tablet TAKE 1 TO 2 TABLETS BY MOUTH EVERY 8 HOURS AS NEEDED FOR PAIN FOR UP TO 15 DAYS 60 tablet 0   RIBOFLAVIN ORAL Take 1 tablet by mouth once daily.   tamsulosin  (FLOMAX) 0.4 mg capsule TAKE 2 CAPSULES BY MOUTH DAILY 60 capsule 3   traZODone (DESYREL) 50 MG tablet TAKE ONE TABLET EACH NIGHT AS NEEDED FORSLEEP 90 tablet 1   VIT C/E/ZN/COPPR/LUTEIN/ZEAXAN (PRESERVISION AREDS 2 ORAL) Take 1 tablet by mouth once daily.   No current facility-administered medications for this visit.   Allergies: No Known Allergies  Social History:  Social History   Socioeconomic History   Marital status: Married  Spouse name: Glenda   Number of children: 1   Years of education: 18   Highest education level: Master's degree (e.g., MA, MS, MEng, MEd, MSW, MBA)  Occupational History   Occupation: Retired- Warden/ranger  Tobacco Use   Smoking status: Never   Smokeless tobacco: Never  Vaping Use   Vaping Use: Never used  Substance and Sexual Activity   Alcohol use: Yes  Alcohol/week: 7.0 standard drinks  Types: 7 Glasses of wine per week  Comment: 1 glass of wine most night   Drug use: No   Sexual activity: Defer  Partners: Female  Social History Narrative  Marital status- Married  Lives with wife  Employment- Retired; 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   Physical Examination:   BP 116/80 (BP Location: Left upper arm, Patient Position: Sitting, BP Cuff Size: Adult)   Ht 162.6 cm (5\' 4" )   Wt 63.8 kg (140 lb 9.6 oz)   BMI 24.13 kg/m   Patient is a well-developed, well-nourished male in no acute distress. Patient has normal mood and affect. Patient is alert and oriented to person, place, and time.    HEENT: Atraumatic, normocephalic. Pupils equal and reactive to light. Extraocular motion intact. Noninjected sclera.  Cardiovascular: Regular rate and rhythm, with no murmurs, rubs, or gallops. Distal pulses palpable.  Respiratory: Lungs clear to auscultation bilaterally.   Right Knee: Soft tissue swelling: minimal; there is a prominent popliteal cyst. Effusion: none Erythema: none Crepitance: mild Tenderness: medial Alignment: relative varus Mediolateral laxity: medial pseudolaxity Posterior sag: negative Patellar tracking: Good tracking without evidence of subluxation or tilt Atrophy: No significant atrophy.  Quadriceps tone was fair to good. Range of motion: 0/7/135 degrees  Sensation intact over the saphenous, lateral sural cutaneous, superficial fibular, and deep fibular nerve distributions.  Tests Performed/Reviewed:  X-rays  3 views of the right knee were reviewed. Images reveal severe loss of medial compartment joint space with significant osteophyte formation. No fractures or dislocations. No osseous abnormality noted.  Impression:   ICD-10-CM  1. Primary osteoarthritis of right knee M17.11   Plan:   The patient has  end-stage degenerative changes of the right knee. It was explained to the patient that the condition is progressive in nature. Having failed conservative treatment, the patient has elected to proceed with a total joint arthroplasty. The patient will undergo a total joint arthroplasty with Dr. Marry Guan. The risks of surgery, including blood clot and infection, were discussed with the patient. Measures to reduce these risks, including the use of anticoagulation, perioperative antibiotics, and early ambulation were discussed. The importance of postoperative physical therapy was discussed with the patient. The patient elects to proceed with surgery. The patient is instructed to stop all blood thinners prior to surgery. The patient is instructed to call the  hospital the day before surgery to learn of the proper arrival time.   Contact our office with any questions or concerns. Follow up as indicated, or sooner should any new problems arise, if conditions worsen, or if they are otherwise concerned.   Gwenlyn Fudge, [facility] [place], [place] [address] Phone: [phone]  This note was generated in part with voice recognition software and I apologize for any typographical errors that were not detected and corrected.  Electronically signed by Gwenlyn Fudge, PA at 01/29/2021 4:08 PM EST

## 2021-01-30 ENCOUNTER — Ambulatory Visit: Payer: Medicare PPO | Admitting: Urgent Care

## 2021-01-30 ENCOUNTER — Observation Stay
Admission: RE | Admit: 2021-01-30 | Discharge: 2021-01-31 | Disposition: A | Discharge status: Home Health | Payer: Medicare PPO | Attending: Orthopedic Surgery | Admitting: Orthopedic Surgery

## 2021-01-30 ENCOUNTER — Other Ambulatory Visit: Payer: Self-pay

## 2021-01-30 ENCOUNTER — Ambulatory Visit: Payer: Medicare PPO | Admitting: Anesthesiology

## 2021-01-30 ENCOUNTER — Observation Stay: Payer: Medicare PPO

## 2021-01-30 ENCOUNTER — Encounter: Payer: Self-pay | Admitting: Orthopedic Surgery

## 2021-01-30 ENCOUNTER — Encounter
Admission: RE | Disposition: A | Discharge status: Home Health | Payer: Self-pay | Source: Home / Self Care | Attending: Orthopedic Surgery

## 2021-01-30 DIAGNOSIS — I10 Essential (primary) hypertension: Secondary | ICD-10-CM | POA: Insufficient documentation

## 2021-01-30 DIAGNOSIS — J45909 Unspecified asthma, uncomplicated: Secondary | ICD-10-CM | POA: Insufficient documentation

## 2021-01-30 DIAGNOSIS — Z79899 Other long term (current) drug therapy: Secondary | ICD-10-CM | POA: Diagnosis not present

## 2021-01-30 DIAGNOSIS — M1711 Unilateral primary osteoarthritis, right knee: Principal | ICD-10-CM | POA: Insufficient documentation

## 2021-01-30 DIAGNOSIS — Z96659 Presence of unspecified artificial knee joint: Secondary | ICD-10-CM

## 2021-01-30 DIAGNOSIS — Z96651 Presence of right artificial knee joint: Secondary | ICD-10-CM

## 2021-01-30 HISTORY — PX: KNEE ARTHROPLASTY: SHX992

## 2021-01-30 SURGERY — ARTHROPLASTY, KNEE, TOTAL, USING IMAGELESS COMPUTER-ASSISTED NAVIGATION
Anesthesia: Spinal | Site: Knee | Laterality: Right

## 2021-01-30 MED ORDER — BUPIVACAINE HCL (PF) 0.25 % IJ SOLN
INTRAMUSCULAR | Status: DC | PRN
Start: 2021-01-30 — End: 2021-01-30
  Administered 2021-01-30: 60 mL

## 2021-01-30 MED ORDER — GABAPENTIN 300 MG PO CAPS: 300.0000 mg | ORAL_CAPSULE | Freq: Once | ORAL | Status: AC

## 2021-01-30 MED ORDER — ACETAMINOPHEN 325 MG PO TABS: 325.0000 mg | ORAL_TABLET | Freq: Four times a day (QID) | ORAL | Status: DC | PRN

## 2021-01-30 MED ORDER — CHLORHEXIDINE GLUCONATE 0.12 % MT SOLN: 15.0000 mL | Freq: Once | OROMUCOSAL | Status: AC

## 2021-01-30 MED ORDER — SODIUM CHLORIDE 0.9 % IR SOLN
Status: DC | PRN
  Administered 2021-01-30: 3000 mL

## 2021-01-30 MED ORDER — MAGNESIUM HYDROXIDE 400 MG/5ML PO SUSP: 30.0000 mL | Freq: Every day | ORAL | Status: DC

## 2021-01-30 MED ORDER — PHENOL 1.4 % MT LIQD
1.0000 | OROMUCOSAL | Status: DC | PRN
  Filled 2021-01-30: qty 177

## 2021-01-30 MED ORDER — ONDANSETRON HCL 4 MG PO TABS: 4.0000 mg | ORAL_TABLET | Freq: Four times a day (QID) | ORAL | Status: DC | PRN

## 2021-01-30 MED ORDER — ONDANSETRON HCL 4 MG/2ML IJ SOLN
INTRAMUSCULAR | Status: DC | PRN
  Administered 2021-01-30: 4 mg via INTRAVENOUS

## 2021-01-30 MED ORDER — OXYCODONE HCL 5 MG PO TABS: 5.0000 mg | ORAL_TABLET | Freq: Once | ORAL | Status: DC | PRN

## 2021-01-30 MED ORDER — METOCLOPRAMIDE HCL 10 MG PO TABS
ORAL_TABLET | ORAL | Status: AC
  Filled 2021-01-30: qty 1

## 2021-01-30 MED ORDER — HYDROMORPHONE HCL 1 MG/ML IJ SOLN: 0.5000 mg | INTRAMUSCULAR | Status: DC | PRN

## 2021-01-30 MED ORDER — DEXAMETHASONE SODIUM PHOSPHATE 10 MG/ML IJ SOLN: 8.0000 mg | Freq: Once | INTRAMUSCULAR | Status: AC

## 2021-01-30 MED ORDER — TRAZODONE HCL 50 MG PO TABS
50.0000 mg | ORAL_TABLET | Freq: Every evening | ORAL | Status: DC | PRN
  Administered 2021-01-30: 50 mg via ORAL
  Filled 2021-01-30 (×2): qty 1

## 2021-01-30 MED ORDER — TRANEXAMIC ACID-NACL 1000-0.7 MG/100ML-% IV SOLN
INTRAVENOUS | Status: AC
  Administered 2021-01-30: 1000 mg via INTRAVENOUS
  Filled 2021-01-30: qty 100

## 2021-01-30 MED ORDER — VITAMIN D3 25 MCG (1000 UNIT) PO TABS
1000.0000 [IU] | ORAL_TABLET | Freq: Every day | ORAL | Status: DC
  Filled 2021-01-30: qty 1

## 2021-01-30 MED ORDER — METOCLOPRAMIDE HCL 10 MG PO TABS
ORAL_TABLET | ORAL | Status: AC
  Administered 2021-01-30: 10 mg via ORAL
  Filled 2021-01-30: qty 1

## 2021-01-30 MED ORDER — TRANEXAMIC ACID-NACL 1000-0.7 MG/100ML-% IV SOLN
INTRAVENOUS | Status: AC
  Filled 2021-01-30: qty 100

## 2021-01-30 MED ORDER — METOCLOPRAMIDE HCL 10 MG PO TABS
10.0000 mg | ORAL_TABLET | Freq: Three times a day (TID) | ORAL | Status: DC
  Administered 2021-01-30: 10 mg via ORAL

## 2021-01-30 MED ORDER — FLEET ENEMA 7-19 GM/118ML RE ENEM: 1.0000 | ENEMA | Freq: Once | RECTAL | Status: DC | PRN

## 2021-01-30 MED ORDER — CEFAZOLIN SODIUM-DEXTROSE 2-4 GM/100ML-% IV SOLN
INTRAVENOUS | Status: AC
  Filled 2021-01-30: qty 100

## 2021-01-30 MED ORDER — FENTANYL CITRATE (PF) 100 MCG/2ML IJ SOLN: 25.0000 ug | INTRAMUSCULAR | Status: DC | PRN

## 2021-01-30 MED ORDER — ENOXAPARIN SODIUM 30 MG/0.3ML IJ SOSY
30.0000 mg | PREFILLED_SYRINGE | Freq: Two times a day (BID) | INTRAMUSCULAR | Status: DC
  Administered 2021-01-31: 30 mg via SUBCUTANEOUS
  Filled 2021-01-30 (×2): qty 0.3

## 2021-01-30 MED ORDER — OXYCODONE HCL 5 MG PO TABS
10.0000 mg | ORAL_TABLET | ORAL | Status: DC | PRN
  Administered 2021-01-31: 10 mg via ORAL

## 2021-01-30 MED ORDER — ENOXAPARIN SODIUM 30 MG/0.3ML IJ SOSY: 30.0000 mg | PREFILLED_SYRINGE | Freq: Two times a day (BID) | INTRAMUSCULAR | Status: DC

## 2021-01-30 MED ORDER — PRONTOSAN WOUND IRRIGATION OPTIME
TOPICAL | Status: DC | PRN
  Administered 2021-01-30: 1 via TOPICAL

## 2021-01-30 MED ORDER — PANTOPRAZOLE SODIUM 40 MG PO TBEC
40.0000 mg | DELAYED_RELEASE_TABLET | Freq: Two times a day (BID) | ORAL | Status: DC
  Administered 2021-01-30: 40 mg via ORAL
  Filled 2021-01-30 (×3): qty 1

## 2021-01-30 MED ORDER — OXYCODONE HCL 5 MG PO TABS
5.0000 mg | ORAL_TABLET | ORAL | Status: DC | PRN
  Administered 2021-01-30: 5 mg via ORAL

## 2021-01-30 MED ORDER — PRESERVISION AREDS 2 PO CAPS: 1.0000 | ORAL_CAPSULE | Freq: Every day | ORAL | Status: DC

## 2021-01-30 MED ORDER — OXYCODONE HCL 5 MG PO TABS
ORAL_TABLET | ORAL | Status: AC
  Filled 2021-01-30: qty 1

## 2021-01-30 MED ORDER — TRAMADOL HCL 50 MG PO TABS
ORAL_TABLET | ORAL | Status: AC
  Filled 2021-01-30: qty 1

## 2021-01-30 MED ORDER — SODIUM CHLORIDE 0.9 % IV SOLN: INTRAVENOUS | Status: DC

## 2021-01-30 MED ORDER — ALUM & MAG HYDROXIDE-SIMETH 200-200-20 MG/5ML PO SUSP: 30.0000 mL | ORAL | Status: DC | PRN

## 2021-01-30 MED ORDER — 0.9 % SODIUM CHLORIDE (POUR BTL) OPTIME
TOPICAL | Status: DC | PRN
  Administered 2021-01-30: 500 mL

## 2021-01-30 MED ORDER — VITAMIN B-12 100 MCG PO TABS
200.0000 ug | ORAL_TABLET | Freq: Every day | ORAL | Status: DC
  Filled 2021-01-30: qty 2

## 2021-01-30 MED ORDER — SODIUM CHLORIDE 0.9 % IV SOLN
INTRAVENOUS | Status: DC | PRN
  Administered 2021-01-30: 60 mL

## 2021-01-30 MED ORDER — BUPIVACAINE HCL (PF) 0.5 % IJ SOLN
INTRAMUSCULAR | Status: DC | PRN
  Administered 2021-01-30: 2.5 mL

## 2021-01-30 MED ORDER — NEOMYCIN-POLYMYXIN-DEXAMETH 0.1 % OP OINT
1.0000 "application " | TOPICAL_OINTMENT | Freq: Every day | OPHTHALMIC | Status: DC | PRN
  Filled 2021-01-30: qty 3.5

## 2021-01-30 MED ORDER — MIDAZOLAM HCL 5 MG/5ML IJ SOLN
INTRAMUSCULAR | Status: DC | PRN
  Administered 2021-01-30 (×2): 1 mg via INTRAVENOUS

## 2021-01-30 MED ORDER — TRANEXAMIC ACID-NACL 1000-0.7 MG/100ML-% IV SOLN: 1000.0000 mg | Freq: Once | INTRAVENOUS | Status: AC

## 2021-01-30 MED ORDER — CELECOXIB 200 MG PO CAPS
ORAL_CAPSULE | ORAL | Status: AC
  Administered 2021-01-30: 200 mg via ORAL
  Filled 2021-01-30: qty 1

## 2021-01-30 MED ORDER — POLYVINYL ALCOHOL 1.4 % OP SOLN
1.0000 [drp] | Freq: Every day | OPHTHALMIC | Status: DC | PRN
  Filled 2021-01-30: qty 15

## 2021-01-30 MED ORDER — TAMSULOSIN HCL 0.4 MG PO CAPS
0.8000 mg | ORAL_CAPSULE | Freq: Every day | ORAL | Status: DC
  Administered 2021-01-30: 0.8 mg via ORAL
  Filled 2021-01-30 (×2): qty 2

## 2021-01-30 MED ORDER — LOSARTAN POTASSIUM 25 MG PO TABS
25.0000 mg | ORAL_TABLET | Freq: Every day | ORAL | Status: DC
  Administered 2021-01-30: 25 mg via ORAL
  Filled 2021-01-30 (×2): qty 1

## 2021-01-30 MED ORDER — MIDAZOLAM HCL 2 MG/2ML IJ SOLN
INTRAMUSCULAR | Status: AC
  Filled 2021-01-30: qty 2

## 2021-01-30 MED ORDER — ACETAMINOPHEN 10 MG/ML IV SOLN
INTRAVENOUS | Status: DC | PRN
  Administered 2021-01-30: 1000 mg via INTRAVENOUS

## 2021-01-30 MED ORDER — PROPOFOL 500 MG/50ML IV EMUL
INTRAVENOUS | Status: DC | PRN
  Administered 2021-01-30: 75 ug/kg/min via INTRAVENOUS

## 2021-01-30 MED ORDER — ORAL CARE MOUTH RINSE: 15.0000 mL | Freq: Once | OROMUCOSAL | Status: AC

## 2021-01-30 MED ORDER — ONDANSETRON HCL 4 MG/2ML IJ SOLN: 4.0000 mg | Freq: Four times a day (QID) | INTRAMUSCULAR | Status: DC | PRN

## 2021-01-30 MED ORDER — CEFAZOLIN SODIUM-DEXTROSE 2-4 GM/100ML-% IV SOLN: 2.0000 g | Freq: Four times a day (QID) | INTRAVENOUS | Status: AC

## 2021-01-30 MED ORDER — CEFAZOLIN SODIUM-DEXTROSE 2-4 GM/100ML-% IV SOLN
2.0000 g | INTRAVENOUS | Status: AC
  Administered 2021-01-30: 2 g via INTRAVENOUS

## 2021-01-30 MED ORDER — ENSURE PRE-SURGERY PO LIQD
296.0000 mL | Freq: Once | ORAL | Status: AC
  Administered 2021-01-30: 296 mL via ORAL
  Filled 2021-01-30: qty 296

## 2021-01-30 MED ORDER — FERROUS SULFATE 325 (65 FE) MG PO TABS
325.0000 mg | ORAL_TABLET | Freq: Two times a day (BID) | ORAL | Status: DC
  Administered 2021-01-31: 325 mg via ORAL
  Filled 2021-01-30 (×3): qty 1

## 2021-01-30 MED ORDER — OCUVITE-LUTEIN PO CAPS
1.0000 | ORAL_CAPSULE | Freq: Every day | ORAL | Status: DC
  Filled 2021-01-30: qty 1

## 2021-01-30 MED ORDER — ONDANSETRON HCL 4 MG/2ML IJ SOLN
INTRAMUSCULAR | Status: AC
  Filled 2021-01-30: qty 2

## 2021-01-30 MED ORDER — OXYCODONE HCL 5 MG/5ML PO SOLN: 5.0000 mg | Freq: Once | ORAL | Status: DC | PRN

## 2021-01-30 MED ORDER — GABAPENTIN 300 MG PO CAPS
ORAL_CAPSULE | ORAL | Status: AC
  Administered 2021-01-30: 300 mg via ORAL
  Filled 2021-01-30: qty 1

## 2021-01-30 MED ORDER — LACTATED RINGERS IV SOLN: INTRAVENOUS | Status: DC

## 2021-01-30 MED ORDER — CELECOXIB 200 MG PO CAPS: 200.0000 mg | ORAL_CAPSULE | Freq: Two times a day (BID) | ORAL | Status: DC

## 2021-01-30 MED ORDER — CALCIUM 600 MG PO TABS: 600.0000 mg | ORAL_TABLET | Freq: Every day | ORAL | Status: DC

## 2021-01-30 MED ORDER — BRIMONIDINE TARTRATE 0.15 % OP SOLN
1.0000 [drp] | Freq: Two times a day (BID) | OPHTHALMIC | Status: DC
  Administered 2021-01-30 – 2021-01-31 (×2): 1 [drp] via OPHTHALMIC
  Filled 2021-01-30: qty 5

## 2021-01-30 MED ORDER — HYDROCHLOROTHIAZIDE 25 MG PO TABS
25.0000 mg | ORAL_TABLET | Freq: Every day | ORAL | Status: DC
Start: 2021-01-31 — End: 2021-01-31
  Filled 2021-01-30: qty 1

## 2021-01-30 MED ORDER — SENNOSIDES-DOCUSATE SODIUM 8.6-50 MG PO TABS
1.0000 | ORAL_TABLET | Freq: Two times a day (BID) | ORAL | Status: DC
  Administered 2021-01-30: 1 via ORAL
  Filled 2021-01-30 (×3): qty 1

## 2021-01-30 MED ORDER — ACETAMINOPHEN 10 MG/ML IV SOLN
1000.0000 mg | Freq: Four times a day (QID) | INTRAVENOUS | Status: DC
  Administered 2021-01-30 – 2021-01-31 (×2): 1000 mg via INTRAVENOUS

## 2021-01-30 MED ORDER — CHLORHEXIDINE GLUCONATE 0.12 % MT SOLN
OROMUCOSAL | Status: AC
  Administered 2021-01-30: 15 mL via OROMUCOSAL
  Filled 2021-01-30: qty 15

## 2021-01-30 MED ORDER — CELECOXIB 200 MG PO CAPS: 400.0000 mg | ORAL_CAPSULE | Freq: Once | ORAL | Status: AC

## 2021-01-30 MED ORDER — CALCIUM CARBONATE 1250 (500 CA) MG PO TABS
1.0000 | ORAL_TABLET | Freq: Every day | ORAL | Status: DC
  Administered 2021-01-31: 500 mg via ORAL
  Filled 2021-01-30: qty 1

## 2021-01-30 MED ORDER — CEFAZOLIN SODIUM-DEXTROSE 2-4 GM/100ML-% IV SOLN
INTRAVENOUS | Status: AC
  Administered 2021-01-30: 2 g via INTRAVENOUS
  Filled 2021-01-30: qty 100

## 2021-01-30 MED ORDER — CEFAZOLIN SODIUM-DEXTROSE 2-4 GM/100ML-% IV SOLN
INTRAVENOUS | Status: AC
  Administered 2021-01-30: 2000 mg
  Filled 2021-01-30: qty 100

## 2021-01-30 MED ORDER — DEXAMETHASONE SODIUM PHOSPHATE 10 MG/ML IJ SOLN
INTRAMUSCULAR | Status: AC
  Administered 2021-01-30: 8 mg via INTRAVENOUS
  Filled 2021-01-30: qty 1

## 2021-01-30 MED ORDER — TRAMADOL HCL 50 MG PO TABS
50.0000 mg | ORAL_TABLET | ORAL | Status: DC | PRN
  Administered 2021-01-30: 50 mg via ORAL

## 2021-01-30 MED ORDER — PHENYLEPHRINE HCL-NACL 20-0.9 MG/250ML-% IV SOLN
INTRAVENOUS | Status: DC | PRN
  Administered 2021-01-30: 10 ug/min via INTRAVENOUS

## 2021-01-30 MED ORDER — ACETAMINOPHEN 10 MG/ML IV SOLN
INTRAVENOUS | Status: AC
  Filled 2021-01-30: qty 100

## 2021-01-30 MED ORDER — CELECOXIB 200 MG PO CAPS
ORAL_CAPSULE | ORAL | Status: AC
  Administered 2021-01-30: 400 mg via ORAL
  Filled 2021-01-30: qty 2

## 2021-01-30 MED ORDER — MENTHOL 3 MG MT LOZG
1.0000 | LOZENGE | OROMUCOSAL | Status: DC | PRN
  Filled 2021-01-30: qty 9

## 2021-01-30 MED ORDER — DIPHENHYDRAMINE HCL 12.5 MG/5ML PO ELIX
12.5000 mg | ORAL_SOLUTION | ORAL | Status: DC | PRN
  Filled 2021-01-30: qty 10

## 2021-01-30 MED ORDER — BISACODYL 10 MG RE SUPP
10.0000 mg | Freq: Every day | RECTAL | Status: DC | PRN
  Filled 2021-01-30: qty 1

## 2021-01-30 MED ORDER — PROPOFOL 1000 MG/100ML IV EMUL
INTRAVENOUS | Status: AC
  Filled 2021-01-30: qty 100

## 2021-01-30 MED ORDER — BUPIVACAINE HCL (PF) 0.5 % IJ SOLN
INTRAMUSCULAR | Status: AC
  Filled 2021-01-30: qty 10

## 2021-01-30 MED ORDER — PHENYLEPHRINE HCL-NACL 20-0.9 MG/250ML-% IV SOLN
INTRAVENOUS | Status: AC
  Filled 2021-01-30: qty 250

## 2021-01-30 MED ORDER — NEOMYCIN-POLYMYXIN B GU 40-200000 IR SOLN
Status: DC | PRN
  Administered 2021-01-30: 14 mL

## 2021-01-30 MED ORDER — TRANEXAMIC ACID-NACL 1000-0.7 MG/100ML-% IV SOLN
1000.0000 mg | INTRAVENOUS | Status: AC
  Administered 2021-01-30: 1000 mg via INTRAVENOUS

## 2021-01-30 SURGICAL SUPPLY — 80 items
ATTUNE MED DOME PAT 38 KNEE (Knees) ×1 IMPLANT
ATTUNE PS FEM RT SZ 6 CEM KNEE (Femur) ×1 IMPLANT
ATTUNE PSRP INSR SZ6 6 KNEE (Insert) ×1 IMPLANT
BASE TIBIA ATTUNE KNEE SYS SZ6 (Knees) IMPLANT
BATTERY INSTRU NAVIGATION (MISCELLANEOUS) ×8 IMPLANT
BLADE SAW 70X12.5 (BLADE) ×2 IMPLANT
BLADE SAW 90X13X1.19 OSCILLAT (BLADE) ×2 IMPLANT
BLADE SAW 90X25X1.19 OSCILLAT (BLADE) ×2 IMPLANT
BONE CEMENT GENTAMICIN (Cement) ×4 IMPLANT
BSPLAT TIB 6 CMNT ROT PLAT STR (Knees) ×1 IMPLANT
BTRY SRG DRVR LF (MISCELLANEOUS) ×4
CEMENT BONE GENTAMICIN 40 (Cement) IMPLANT
COOLER POLAR GLACIER W/PUMP (MISCELLANEOUS) ×2 IMPLANT
CUFF TOURN SGL QUICK 24 (TOURNIQUET CUFF)
CUFF TOURN SGL QUICK 34 (TOURNIQUET CUFF)
CUFF TRNQT CYL 24X4X16.5-23 (TOURNIQUET CUFF) IMPLANT
CUFF TRNQT CYL 34X4.125X (TOURNIQUET CUFF) IMPLANT
DRAPE 3/4 80X56 (DRAPES) ×2 IMPLANT
DRAPE INCISE IOBAN 66X45 STRL (DRAPES) ×2 IMPLANT
DRSG DERMACEA 8X12 NADH (GAUZE/BANDAGES/DRESSINGS) ×2 IMPLANT
DRSG MEPILEX SACRM 8.7X9.8 (GAUZE/BANDAGES/DRESSINGS) ×2 IMPLANT
DRSG OPSITE POSTOP 4X12 (GAUZE/BANDAGES/DRESSINGS) ×1 IMPLANT
DRSG OPSITE POSTOP 4X14 (GAUZE/BANDAGES/DRESSINGS) ×2 IMPLANT
DRSG TEGADERM 4X4.75 (GAUZE/BANDAGES/DRESSINGS) ×2 IMPLANT
DURAPREP 26ML APPLICATOR (WOUND CARE) ×4 IMPLANT
ELECT CAUTERY BLADE 6.4 (BLADE) ×2 IMPLANT
ELECT REM PT RETURN 9FT ADLT (ELECTROSURGICAL) ×2
ELECTRODE REM PT RTRN 9FT ADLT (ELECTROSURGICAL) ×1 IMPLANT
EX-PIN ORTHOLOCK NAV 4X150 (PIN) ×4 IMPLANT
GLOVE SRG 8 PF TXTR STRL LF DI (GLOVE) ×1 IMPLANT
GLOVE SURG ENC TEXT LTX SZ7.5 (GLOVE) ×4 IMPLANT
GLOVE SURG UNDER POLY LF SZ7.5 (GLOVE) ×2 IMPLANT
GLOVE SURG UNDER POLY LF SZ8 (GLOVE) ×2
GOWN STRL REUS W/ TWL LRG LVL3 (GOWN DISPOSABLE) ×2 IMPLANT
GOWN STRL REUS W/ TWL XL LVL3 (GOWN DISPOSABLE) ×1 IMPLANT
GOWN STRL REUS W/TWL LRG LVL3 (GOWN DISPOSABLE) ×4
GOWN STRL REUS W/TWL XL LVL3 (GOWN DISPOSABLE) ×2
HEMOVAC 400CC 10FR (MISCELLANEOUS) ×2 IMPLANT
HOLDER FOLEY CATH W/STRAP (MISCELLANEOUS) ×2 IMPLANT
HOLSTER ELECTROSUGICAL PENCIL (MISCELLANEOUS) ×2 IMPLANT
IV NS IRRIG 3000ML ARTHROMATIC (IV SOLUTION) ×2 IMPLANT
KIT TURNOVER KIT A (KITS) ×2 IMPLANT
KNIFE SCULPS 14X20 (INSTRUMENTS) ×2 IMPLANT
LABEL OR SOLS (LABEL) ×2 IMPLANT
MANIFOLD NEPTUNE II (INSTRUMENTS) ×4 IMPLANT
NDL SAFETY ECLIPSE 18X1.5 (NEEDLE) ×1 IMPLANT
NDL SPNL 20GX3.5 QUINCKE YW (NEEDLE) ×2 IMPLANT
NEEDLE HYPO 18GX1.5 SHARP (NEEDLE) ×2
NEEDLE SPNL 20GX3.5 QUINCKE YW (NEEDLE) ×4 IMPLANT
NS IRRIG 500ML POUR BTL (IV SOLUTION) ×2 IMPLANT
PACK TOTAL KNEE (MISCELLANEOUS) ×2 IMPLANT
PAD ABD DERMACEA PRESS 5X9 (GAUZE/BANDAGES/DRESSINGS) ×4 IMPLANT
PAD WRAPON POLAR KNEE (MISCELLANEOUS) ×1 IMPLANT
PENCIL SMOKE EVACUATOR COATED (MISCELLANEOUS) ×2 IMPLANT
PIN DRILL FIX HALF THREAD (BIT) ×4 IMPLANT
PIN DRILL QUICK PACK ×4 IMPLANT
PIN FIXATION 1/8DIA X 3INL (PIN) ×2 IMPLANT
PULSAVAC PLUS IRRIG FAN TIP (DISPOSABLE) ×2
SOL PREP PVP 2OZ (MISCELLANEOUS) ×2
SOLUTION PREP PVP 2OZ (MISCELLANEOUS) ×1 IMPLANT
SOLUTION PRONTOSAN WOUND 350ML (IRRIGATION / IRRIGATOR) ×2 IMPLANT
SPONGE DRAIN TRACH 4X4 STRL 2S (GAUZE/BANDAGES/DRESSINGS) ×2 IMPLANT
SPONGE T-LAP 18X18 ~~LOC~~+RFID (SPONGE) ×1 IMPLANT
STAPLER SKIN PROX 35W (STAPLE) ×2 IMPLANT
STOCKINETTE IMPERV 14X48 (MISCELLANEOUS) IMPLANT
STRAP TIBIA SHORT (MISCELLANEOUS) ×2 IMPLANT
SUCTION FRAZIER HANDLE 10FR (MISCELLANEOUS) ×1
SUCTION TUBE FRAZIER 10FR DISP (MISCELLANEOUS) ×1 IMPLANT
SUT VIC AB 0 CT1 36 (SUTURE) ×4 IMPLANT
SUT VIC AB 1 CT1 36 (SUTURE) ×4 IMPLANT
SUT VIC AB 2-0 CT2 27 (SUTURE) ×2 IMPLANT
SYR 20ML LL LF (SYRINGE) ×2 IMPLANT
SYR 30ML LL (SYRINGE) ×4 IMPLANT
TIBIA ATTUNE KNEE SYS BASE SZ6 (Knees) ×2 IMPLANT
TIP FAN IRRIG PULSAVAC PLUS (DISPOSABLE) ×1 IMPLANT
TOWEL OR 17X26 4PK STRL BLUE (TOWEL DISPOSABLE) ×2 IMPLANT
TOWER CARTRIDGE SMART MIX (DISPOSABLE) ×2 IMPLANT
TRAY FOLEY MTR SLVR 16FR STAT (SET/KITS/TRAYS/PACK) ×2 IMPLANT
WATER STERILE IRR 1000ML POUR (IV SOLUTION) ×1 IMPLANT
WRAPON POLAR PAD KNEE (MISCELLANEOUS) ×2

## 2021-01-30 NOTE — Op Note (Signed)
OPERATIVE NOTE  DATE OF SURGERY:  01/30/2021  PATIENT NAME:  Carlos Neal.   DOB: 1940/08/11  MRN: [id number]  PRE-OPERATIVE DIAGNOSIS: Degenerative arthrosis of the right knee, primary  POST-OPERATIVE DIAGNOSIS:  Same  PROCEDURE:  Right total knee arthroplasty using computer-assisted navigation  SURGEON:  Jena Gauss. M.D.  ASSISTANT: Baldwin Jamaica, PA-C (present and scrubbed throughout the case, critical for assistance with exposure, retraction, instrumentation, and closure)  ANESTHESIA: spinal  ESTIMATED BLOOD LOSS: 50 mL  FLUIDS REPLACED: 1000 mL of crystalloid  TOURNIQUET TIME: 88 minutes  DRAINS: 2 medium Hemovac drains  SOFT TISSUE RELEASES: Anterior cruciate ligament, posterior cruciate ligament, deep medial collateral ligament, patellofemoral ligament  IMPLANTS UTILIZED: DePuy Attune size 6 posterior stabilized femoral component (cemented), size 6 rotating platform tibial component (cemented), 38 mm medialized dome patella (cemented), and a 3 mm stabilized rotating platform polyethylene insert.  INDICATIONS FOR SURGERY: Carlos Neal. is a 81 y.o. year old male with a long history of progressive knee pain. X-rays demonstrated severe degenerative changes in tricompartmental fashion. The patient had not seen any significant improvement despite conservative nonsurgical intervention. After discussion of the risks and benefits of surgical intervention, the patient expressed understanding of the risks benefits and agree with plans for total knee arthroplasty.   The risks, benefits, and alternatives were discussed at length including but not limited to the risks of infection, bleeding, nerve injury, stiffness, blood clots, the need for revision surgery, cardiopulmonary complications, among others, and they were willing to proceed.  PROCEDURE IN DETAIL: The patient was brought into the operating room and, after adequate spinal anesthesia was achieved, a tourniquet was  placed on the patient's upper thigh. The patient's knee and leg were cleaned and prepped with alcohol and DuraPrep and draped in the usual sterile fashion. A "timeout" was performed as per usual protocol. The lower extremity was exsanguinated using an Esmarch, and the tourniquet was inflated to 300 mmHg. An anterior longitudinal incision was made followed by a standard mid vastus approach. The deep fibers of the medial collateral ligament were elevated in a subperiosteal fashion off of the medial flare of the tibia so as to maintain a continuous soft tissue sleeve. The patella was subluxed laterally and the patellofemoral ligament was incised. Inspection of the knee demonstrated severe degenerative changes with full-thickness loss of articular cartilage. Osteophytes were debrided using a rongeur. Anterior and posterior cruciate ligaments were excised. Two 4.0 mm Schanz pins were inserted in the femur and into the tibia for attachment of the array of trackers used for computer-assisted navigation. Hip center was identified using a circumduction technique. Distal landmarks were mapped using the computer. The distal femur and proximal tibia were mapped using the computer. The distal femoral cutting guide was positioned using computer-assisted navigation so as to achieve a 5 distal valgus cut. The femur was sized and it was felt that a size 6 femoral component was appropriate. A size 6 femoral cutting guide was positioned and the anterior cut was performed and verified using the computer. This was followed by completion of the posterior and chamfer cuts. Femoral cutting guide for the central box was then positioned in the center box cut was performed.  Attention was then directed to the proximal tibia. Medial and lateral menisci were excised. The extramedullary tibial cutting guide was positioned using computer-assisted navigation so as to achieve a 0 varus-valgus alignment and 3 posterior slope. The cut was  performed and verified using the computer. The proximal  tibia was sized and it was felt that a size 6 tibial tray was appropriate. Tibial and femoral trials were inserted followed by insertion of a 6 mm polyethylene insert. This allowed for excellent mediolateral soft tissue balancing both in flexion and in full extension. Finally, the patella was cut and prepared so as to accommodate a 38 mm medialized dome patella. A patella trial was placed and the knee was placed through a range of motion with excellent patellar tracking appreciated. The femoral trial was removed after debridement of posterior osteophytes. The central post-hole for the tibial component was reamed followed by insertion of a keel punch. Tibial trials were then removed. Cut surfaces of bone were irrigated with copious amounts of normal saline using pulsatile lavage and then suctioned dry. Polymethylmethacrylate cement with gentamicin was prepared in the usual fashion using a vacuum mixer. Cement was applied to the cut surface of the proximal tibia as well as along the undersurface of a size 6 rotating platform tibial component. Tibial component was positioned and impacted into place. Excess cement was removed using Personal assistant. Cement was then applied to the cut surfaces of the femur as well as along the posterior flanges of the size 6 femoral component. The femoral component was positioned and impacted into place. Excess cement was removed using Personal assistant. A 6 mm polyethylene trial was inserted and the knee was brought into full extension with steady axial compression applied. Finally, cement was applied to the backside of a 38 mm medialized dome patella and the patellar component was positioned and patellar clamp applied. Excess cement was removed using Personal assistant. After adequate curing of the cement, the tourniquet was deflated after a total tourniquet time of 88 minutes. Hemostasis was achieved using electrocautery. The knee was  irrigated with copious amounts of normal saline using pulsatile lavage followed by 350 ml of Prontosan and then suctioned dry. 20 mL of 1.3% Exparel and 60 mL of 0.25% Marcaine in 40 mL of normal saline was injected along the posterior capsule, medial and lateral gutters, and along the arthrotomy site. A 6 mm stabilized rotating platform polyethylene insert was inserted and the knee was placed through a range of motion with excellent mediolateral soft tissue balancing appreciated and excellent patellar tracking noted. 2 medium drains were placed in the wound bed and brought out through separate stab incisions. The medial parapatellar portion of the incision was reapproximated using interrupted sutures of #1 Vicryl. Subcutaneous tissue was approximated in layers using first #0 Vicryl followed #2-0 Vicryl. The skin was approximated with skin staples. A sterile dressing was applied.  The patient tolerated the procedure well and was transported to the recovery room in stable condition.    Brantley Wiley P. Angie Fava., M.D.

## 2021-01-30 NOTE — TOC Progression Note (Signed)
Transition of Care (TOC) - Progression Note    Patient Details  Name: Carlos Neal. MRN: [id number] Date of Birth: [birth year]  Transition of [facility]) CM/SW Contact  Marlowe Sax, RN Phone Number: 01/30/2021, 3:56 PM  Clinical Narrative:   Open with [facility] for Home health services, PT to evaluated and make recommendation, TOC to follow and assist with DC planning and needs         Expected Discharge Plan and Services                                                 Social Determinants of Health (SDOH) Interventions    Readmission Risk Interventions No flowsheet data found.

## 2021-01-30 NOTE — TOC Progression Note (Signed)
Transition of Care (TOC) - Progression Note    Patient Details  Name: Carlos Neal. MRN: [id number] Date of Birth: 1940/08/11  [facility] of [facility]) CM/SW Contact  Marlowe Sax, RN Phone Number: 01/30/2021, 3:53 PM  Clinical Narrative:   The patient is open with [facility] for Home health, [employer] to deliver a 3 in 1 and RW tio the bedside prior to DC         Expected Discharge Plan and Services                                                 Social Determinants of Health (SDOH) Interventions    Readmission Risk Interventions No flowsheet data found.

## 2021-01-30 NOTE — Evaluation (Signed)
Physical Therapy Evaluation Patient Details Name: Carlos Neal. MRN: [id number] DOB: 18-Dec-1940 Today's Date: 01/30/2021  History of Present Illness  Pt is 81 y.o. male s/p R TKA on 01/30/21.  Pt has PMH including: asthma, HTN, GERD, arthritis, and lumbar laminectomy/decompression at L2-L3.    Clinical Impression  Pt received in Semi-Fowler's position and agreeable to therapy.  Pt's wife in room and was educated on everything during treatment session as well.  Pt put forth great effort and was able to participate in bed mobility, ambulation around the PACU, and received education as noted below, with good carryover.  Pt will need to attempt stair training before being d/c home due to not having any railing and 3 steps.  Pt's goal is to be able to get back on the golf course as soon as possible.  Pt with good motivation and support from wife.  ROM measurements provided below and are only limited by ace bandage and polar care applied (R Knee Flexion: 90 deg; Extension: 4 deg lacking).  Current discharge plans to home with HHPT are appropriate at this time.  Pt will continue to benefit from skilled therapy in order to address deficits listed below.        Recommendations for follow up therapy are one component of a multi-disciplinary discharge planning process, led by the attending physician.  Recommendations may be updated based on patient status, additional functional criteria and insurance authorization.  Follow Up Recommendations Home health PT    Assistance Recommended at Discharge Set up Supervision/Assistance  Patient can return home with the following  A little help with walking and/or transfers;A little help with bathing/dressing/bathroom    Equipment Recommendations Rolling walker (2 wheels);BSC/3in1  Recommendations for Other Services       Functional Status Assessment Patient has had a recent decline in their functional status and demonstrates the ability to make significant  improvements in function in a reasonable and predictable amount of time.     Precautions / Restrictions Precautions Precautions: Knee Precaution Booklet Issued: Yes (comment) Restrictions Weight Bearing Restrictions: Yes RLE Weight Bearing: Weight bearing as tolerated      Mobility  Bed Mobility Overal bed mobility: Needs Assistance Bed Mobility: Supine to Sit     Supine to sit: Supervision     General bed mobility comments: pt with good technique in getting to EOB.    Transfers Overall transfer level: Needs assistance Equipment used: Rolling walker (2 wheels) Transfers: Sit to/from Stand Sit to Stand: Min guard           General transfer comment: CGA utilized for safety with frist time getting up.    Ambulation/Gait Ambulation/Gait assistance: Min guard Gait Distance (Feet): 160 Feet Assistive device: Rolling walker (2 wheels) Gait Pattern/deviations: Step-through pattern, Decreased step length - left, Decreased stance time - right, Decreased stride length Gait velocity: decreased     General Gait Details: Pt with expected technique s/p R TKA.  Pt does allow walker to get away from him at times, but is able to correct without VC's.  Stairs            Wheelchair Mobility    Modified Rankin (Stroke Patients Only)       Balance                                             Pertinent Vitals/Pain  Pain Assessment Pain Assessment: No/denies pain    Home Living Family/patient expects to be discharged to:: Private residence Living Arrangements: Spouse/significant other Available Help at Discharge: Family Type of Home: House Home Access: Stairs to enter Entrance Stairs-Rails: None Entrance Stairs-Number of Steps: 3   Home Layout: One level Home Equipment: Agricultural consultantolling Walker (2 wheels);Cane - single point;Grab bars - tub/shower      Prior Function Prior Level of Function : Independent/Modified Independent               ADLs  Comments: Walking with a SPC prior to surgery.     Hand Dominance   Dominant Hand: Right    Extremity/Trunk Assessment   Upper Extremity Assessment Upper Extremity Assessment: Overall WFL for tasks assessed    Lower Extremity Assessment Lower Extremity Assessment: RLE deficits/detail RLE Deficits / Details: weakness s/p R TKA       Communication   Communication: No difficulties  Cognition Arousal/Alertness: Awake/alert Behavior During Therapy: WFL for tasks assessed/performed Overall Cognitive Status: Within Functional Limits for tasks assessed                                          General Comments      Exercises Total Joint Exercises Ankle Circles/Pumps: AROM, Strengthening, Both, 10 reps, Supine Quad Sets: AROM, Strengthening, Both, 10 reps, Supine Gluteal Sets: AROM, Strengthening, Both, 10 reps, Supine Heel Slides: AROM, Strengthening, Both, 10 reps, Supine Hip ABduction/ADduction: AROM, Strengthening, Both, 10 reps, Supine Straight Leg Raises: AROM, Strengthening, Both, 10 reps, Supine Knee Flexion: AROM, Strengthening, Right, Seated Goniometric ROM: R Knee Flexion: 90 deg; Extension: Lacking 4 deg likely due to bandage.  Pt felt he could bend more but was limited by ace wrap/ice machine. Marching in Standing: AROM, Strengthening, Both, 10 reps, Standing Other Exercises Other Exercises: Pt and wife given extensive education on roles of PT, importance of exercises, HEP, bone foam, polar care, and management at home.   Assessment/Plan    PT Assessment Patient needs continued PT services  PT Problem List Decreased strength;Decreased range of motion;Decreased balance;Decreased mobility;Decreased knowledge of use of DME;Pain       PT Treatment Interventions DME instruction;Gait training;Stair training;Functional mobility training;Therapeutic activities;Therapeutic exercise;Balance training;Neuromuscular re-education    PT Goals (Current goals  can be found in the Care Plan section)  Acute Rehab PT Goals Patient Stated Goal: to get back on the golf course PT Goal Formulation: With patient Time For Goal Achievement: 02/13/21 Potential to Achieve Goals: Fair    Frequency BID     Co-evaluation               AM-PAC PT "6 Clicks" Mobility  Outcome Measure Help needed turning from your back to your side while in a flat bed without using bedrails?: A Little Help needed moving from lying on your back to sitting on the side of a flat bed without using bedrails?: A Little Help needed moving to and from a bed to a chair (including a wheelchair)?: A Little Help needed standing up from a chair using your arms (e.g., wheelchair or bedside chair)?: A Little Help needed to walk in hospital room?: A Little Help needed climbing 3-5 steps with a railing? : A Lot 6 Click Score: 17    End of Session Equipment Utilized During Treatment: Gait belt Activity Tolerance: Patient tolerated treatment well Patient left: in bed;with call bell/phone  within reach;with family/visitor present Nurse Communication: Mobility status PT Visit Diagnosis: Other abnormalities of gait and mobility (R26.89);Muscle weakness (generalized) (M62.81);Difficulty in walking, not elsewhere classified (R26.2);Pain Pain - Right/Left: Right Pain - part of body: Knee    Time: 6237-6283 PT Time Calculation (min) (ACUTE ONLY): 55 min   Charges:   PT Evaluation $PT Eval Low Complexity: 1 Low PT Treatments $Gait Training: 8-22 mins $Therapeutic Exercise: 8-22 mins $Therapeutic Activity: 8-22 mins        Nolon Bussing, PT, DPT 01/30/21, 7:41 PM   Phineas Real 01/30/2021, 7:37 PM

## 2021-01-30 NOTE — H&P (Signed)
The patient has been re-examined, and the chart reviewed, and there have been no interval changes to the documented history and physical.    The risks, benefits, and alternatives have been discussed at length. The patient expressed understanding of the risks benefits and agreed with plans for surgical intervention.  Sarahanne Novakowski P. Theresa Dohrman, Jr. M.D.    

## 2021-01-30 NOTE — Anesthesia Procedure Notes (Signed)
Spinal  Patient location during procedure: OR Start time: 01/30/2021 11:20 AM End time: 01/30/2021 11:23 AM Reason for block: surgical anesthesia Staffing Performed: resident/CRNA  Anesthesiologist: Piscitello, Precious Haws, MD Resident/CRNA: Jerrye Noble, CRNA Preanesthetic Checklist Completed: patient identified, IV checked, site marked, risks and benefits discussed, surgical consent, monitors and equipment checked, pre-op evaluation and timeout performed Spinal Block Patient position: sitting Prep: ChloraPrep Patient monitoring: heart rate, continuous pulse ox, blood pressure and cardiac monitor Approach: midline Location: L3-4 Injection technique: single-shot Needle Needle type: Introducer and Pencan  Needle gauge: 24 G Needle length: 9 cm Assessment Sensory level: T10 Events: CSF return Additional Notes Sterile aseptic technique used throughout the procedure.  Negative paresthesia. Negative blood return. Positive free-flowing CSF. Expiration date of kit checked and confirmed. Patient tolerated procedure well, without complications.

## 2021-01-30 NOTE — Anesthesia Preprocedure Evaluation (Signed)
Anesthesia Evaluation  Patient identified by MRN, date of birth, ID band Patient awake    Reviewed: Allergy & Precautions, NPO status , Patient's Chart, lab work & pertinent test results  History of Anesthesia Complications Negative for: history of anesthetic complications  Airway Mallampati: III  TM Distance: >3 FB Neck ROM: full    Dental  (+) Chipped, Poor Dentition, Missing   Pulmonary neg shortness of breath, asthma ,    Pulmonary exam normal        Cardiovascular Exercise Tolerance: Good hypertension, (-) anginaNormal cardiovascular exam     Neuro/Psych  Neuromuscular disease negative psych ROS   GI/Hepatic Neg liver ROS, GERD  Controlled,  Endo/Other  negative endocrine ROS  Renal/GU      Musculoskeletal  (+) Arthritis ,   Abdominal   Peds  Hematology negative hematology ROS (+)   Anesthesia Other Findings Patient reports chronic back pain but would still like a spinal for his anesthetic   Past Medical History: No date: Asthma     Comment:  as a child No date: GERD (gastroesophageal reflux disease) No date: Hypertension No date: Osteoarthritis     Comment:  back and knee No date: Osteopenia No date: Osteoporosis No date: Vitamin D deficiency  Past Surgical History: 09/23/2020: BROW LIFT; Bilateral     Comment:  Procedure: BLEPHAROPTOSIS REPAIR; RESECT EX  BROW PTOSIS              REPAIR ECTROPION REPAIR, EXTENSIVE BILATERAL;  Surgeon:               Karle Starch, MD;  Location: [facility];                Service: Ophthalmology;  Laterality: Bilateral;  wants to              be later in the schedule 01/12/2013: COLONOSCOPY No date: HYDROCELE EXCISION     Comment:  age 67 No date: INGUINAL HERNIA REPAIR; Bilateral     Comment:  as a child 01/15/2020: LUMBAR LAMINECTOMY/ DECOMPRESSION WITH MET-RX; Right     Comment:  Procedure: L2-3 DECOMPRESSION, RIGHT L2-3 FAR LATERAL                DISCECTOMY;  Surgeon: Meade Maw, MD;  Location:               [facility];  Service: Neurosurgery;  Laterality: Right;                2nd case     Reproductive/Obstetrics negative OB ROS                             Anesthesia Physical Anesthesia Plan  ASA: 3  Anesthesia Plan: Spinal   Post-op Pain Management:    Induction:   PONV Risk Score and Plan:   Airway Management Planned: Natural Airway and Nasal Cannula  Additional Equipment:   Intra-op Plan:   Post-operative Plan:   Informed Consent: I have reviewed the patients History and Physical, chart, labs and discussed the procedure including the risks, benefits and alternatives for the proposed anesthesia with the patient or authorized representative who has indicated his/her understanding and acceptance.     Dental Advisory Given  Plan Discussed with: Anesthesiologist, CRNA and Surgeon  Anesthesia Plan Comments: (Patient reports no bleeding problems and no anticoagulant use.  Plan for spinal with backup GA  Patient consented for risks of anesthesia including but  not limited to:  - adverse reactions to medications - damage to eyes, teeth, lips or other oral mucosa - nerve damage due to positioning  - risk of bleeding, infection and or nerve damage from spinal that could lead to paralysis - risk of headache or failed spinal - damage to teeth, lips or other oral mucosa - sore throat or hoarseness - damage to heart, brain, nerves, lungs, other parts of body or loss of life  Patient voiced understanding.)        Anesthesia Quick Evaluation

## 2021-01-30 NOTE — Transfer of Care (Signed)
Immediate Anesthesia Transfer of Care Note  Patient: Carlos Neal.  Procedure(s) Performed: COMPUTER ASSISTED TOTAL KNEE ARTHROPLASTY (Right: Knee)  Patient Location: PACU  Anesthesia Type:General and Spinal  Level of Consciousness: drowsy  Airway & Oxygen Therapy: Patient Spontanous Breathing  Post-op Assessment: Report given to RN and Post -op Vital signs reviewed and stable  Post vital signs: Reviewed and stable  Last Vitals:  Vitals Value Taken Time  BP 129/84 01/30/21 1456  Temp    Pulse 74 01/30/21 1459  Resp 20 01/30/21 1459  SpO2 98 % 01/30/21 1459  Vitals shown include unvalidated device data.  Last Pain:  Vitals:   01/30/21 1059  PainSc: 4          Complications: No notable events documented.

## 2021-01-30 NOTE — Plan of Care (Signed)
Instructions given to patient and family

## 2021-01-30 NOTE — Progress Notes (Signed)
Patient awake/alert x4. Able to move bil lower ext, sensation intact. Patient I&O cath'd with coude cath #2fr at 1500. Will continue to monitor. Vitals at baseline for patient, afebrile, RLL with dressing c/d/I with x1 hemovac and polar care. No c/o's upon transfer.

## 2021-01-30 NOTE — Anesthesia Procedure Notes (Signed)
Procedure Name: MAC Date/Time: 01/30/2021 11:25 AM Performed by: Jerrye Noble, CRNA Pre-anesthesia Checklist: Patient identified, Emergency Drugs available, Suction available and Patient being monitored Patient Re-evaluated:Patient Re-evaluated prior to induction Oxygen Delivery Method: Simple face mask

## 2021-01-31 ENCOUNTER — Encounter: Payer: Self-pay | Admitting: Orthopedic Surgery

## 2021-01-31 DIAGNOSIS — M1711 Unilateral primary osteoarthritis, right knee: Secondary | ICD-10-CM | POA: Diagnosis not present

## 2021-01-31 MED ORDER — ENOXAPARIN SODIUM 40 MG/0.4ML IJ SOSY: 40.0000 mg | PREFILLED_SYRINGE | INTRAMUSCULAR | 0 refills | Status: DC

## 2021-01-31 MED ORDER — OXYCODONE HCL 5 MG PO TABS: 5.0000 mg | ORAL_TABLET | ORAL | 0 refills | Status: DC | PRN

## 2021-01-31 MED ORDER — CELECOXIB 200 MG PO CAPS: 200.0000 mg | ORAL_CAPSULE | Freq: Two times a day (BID) | ORAL | 0 refills | Status: DC

## 2021-01-31 MED ORDER — CELECOXIB 200 MG PO CAPS
ORAL_CAPSULE | ORAL | Status: AC
  Administered 2021-01-31: 200 mg via ORAL
  Filled 2021-01-31: qty 1

## 2021-01-31 MED ORDER — TRAMADOL HCL 50 MG PO TABS: 50.0000 mg | ORAL_TABLET | ORAL | 0 refills | Status: DC | PRN

## 2021-01-31 MED ORDER — MAGNESIUM HYDROXIDE 400 MG/5ML PO SUSP
ORAL | Status: AC
  Administered 2021-01-31: 30 mL via ORAL
  Filled 2021-01-31: qty 30

## 2021-01-31 MED ORDER — OXYCODONE HCL 5 MG PO TABS
ORAL_TABLET | ORAL | Status: AC
  Filled 2021-01-31: qty 2

## 2021-01-31 NOTE — TOC Progression Note (Signed)
Transition of Care (TOC) - Progression Note    Patient Details  Name: Carlos Neal. MRN: [id number] Date of Birth: [birth year]  Transition of Care [facility]) CM/SW Contact  Marlowe Sax, RN Phone Number: 01/31/2021, 10:54 AM  Clinical Narrative:    Faxed clinical and orders to [facility] care sollutions [phone]        Expected Discharge Plan and Services           Expected Discharge Date: 01/31/21                                     Social Determinants of Health (SDOH) Interventions    Readmission Risk Interventions No flowsheet data found.

## 2021-01-31 NOTE — Progress Notes (Signed)
Patient tolerating PO, IV stopped, converted to saline lock as ordered.

## 2021-01-31 NOTE — Progress Notes (Signed)
Physical Therapy Treatment Patient Details Name: Carlos Neal. MRN: [id number] DOB: [birth year] Today's Date: 01/31/2021   History of Present Illness Pt is 81 y.o. male s/p R TKA on 01/30/21.  Pt has PMH including: asthma, HTN, GERD, arthritis, and lumbar laminectomy/decompression at L2-L3.    PT Comments    Pt received in bed agreeable to PT with spouse present. Pt remains mod-I with bed mobility and minguard to stand to RW with min VC's for hand placement. Pt progressing amb with supervision, step through pattern with education and VC's for heel strike on RLE to improve R knee extension AROM and normalized gait mechanics.  Good carryover throughout with gait along with VC's for upright posture to reduce kyphosis with ambulation. PT demo provided on asc/desc stairs with RW with pt completing safely afterwards with good LE and RW sequencing with minguard provided for safety. Spouse educated on safe guarding techniques throughout. Pt returned to EOB performing HEP packet with min VC's required for correct form/technique with fair carryover after. Pt returning to supine with supervision with polar care applied and bone foam donned and all needs in place. Pt remains safe to d/c home with [facility] PT services.    Recommendations for follow up therapy are one component of a multi-disciplinary discharge planning process, led by the attending physician.  Recommendations may be updated based on patient status, additional functional criteria and insurance authorization.  Follow Up Recommendations  Home health PT     Assistance Recommended at Discharge Set up Supervision/Assistance  Patient can return home with the following A little help with walking and/or transfers;A little help with bathing/dressing/bathroom   Equipment Recommendations  Rolling walker (2 wheels);BSC/3in1    Recommendations for Other Services       Precautions / Restrictions Precautions Precautions: Knee Precaution Booklet  Issued: Yes (comment) Restrictions Weight Bearing Restrictions: Yes RLE Weight Bearing: Weight bearing as tolerated     Mobility  Bed Mobility Overal bed mobility: Modified Independent Bed Mobility: Supine to Sit     Supine to sit: Supervision     General bed mobility comments: no need for rail use today Patient Response: Cooperative  Transfers Overall transfer level: Needs assistance Equipment used: Rolling walker (2 wheels) Transfers: Sit to/from Stand             General transfer comment: min VC's for safe hand placement to attain standing to RW    Ambulation/Gait Ambulation/Gait assistance: Supervision Gait Distance (Feet): 180 Feet Assistive device: Rolling walker (2 wheels) Gait Pattern/deviations: Step-through pattern, Decreased step length - left, Decreased stance time - right, Decreased stride length       General Gait Details: With cuing, pt able to perform consistent heel strike.   Stairs Stairs: Yes Stairs assistance: Min guard Stair Management: No rails, Step to pattern, Backwards, With walker Number of Stairs: 4 General stair comments: PT demo. Pt demo'ing safe and correct RW and LE sequencing.   Wheelchair Mobility    Modified Rankin (Stroke Patients Only)       Balance Overall balance assessment: Needs assistance Sitting-balance support: No upper extremity supported, Feet supported Sitting balance-Leahy Scale: Normal     Standing balance support: No upper extremity supported, During functional activity Standing balance-Leahy Scale: Good                              Cognition Arousal/Alertness: Awake/alert Behavior During Therapy: WFL for tasks assessed/performed Overall Cognitive Status: Within Functional  Limits for tasks assessed                                          Exercises Total Joint Exercises Ankle Circles/Pumps: AROM, Strengthening, Both, 10 reps, Seated Quad Sets: AROM, Strengthening,  10 reps, Seated, Right Hip ABduction/ADduction: AROM, Strengthening, 10 reps, Seated, Right Straight Leg Raises: AROM, Strengthening, 10 reps, Seated, Right Long Arc Quad: AROM, Seated, Strengthening, Right, 10 reps Knee Flexion: AROM, Strengthening, Right, Seated Goniometric ROM: ABle to reach 105 deg flexion. Lacking ~3-5 deg extension Other Exercises Other Exercises: Education on car transfer provided    General Comments        Pertinent Vitals/Pain Pain Assessment Pain Assessment: Faces Faces Pain Scale: Hurts little more Pain Location: R knee Pain Descriptors / Indicators: Grimacing Pain Intervention(s): Limited activity within patient's tolerance, Monitored during session, Premedicated before session, Repositioned, Ice applied    Home Living Family/patient expects to be discharged to:: Private residence Living Arrangements: Spouse/significant other Available Help at Discharge: Family Type of Home: House Home Access: Stairs to enter Entrance Stairs-Rails: None Entrance Stairs-Number of Steps: 3   Home Layout: One level Home Equipment: Conservation officer, nature (2 wheels);Cane - single point;Grab bars - tub/shower      Prior Function            PT Goals (current goals can now be found in the care plan section) Acute Rehab PT Goals Patient Stated Goal: to get back on the golf course PT Goal Formulation: With patient Time For Goal Achievement: 02/13/21 Potential to Achieve Goals: Fair Progress towards PT goals: Progressing toward goals    Frequency    BID      PT Plan Current plan remains appropriate    Co-evaluation              AM-PAC PT "6 Clicks" Mobility   Outcome Measure  Help needed turning from your back to your side while in a flat bed without using bedrails?: None Help needed moving from lying on your back to sitting on the side of a flat bed without using bedrails?: None Help needed moving to and from a bed to a chair (including a wheelchair)?: A  Little Help needed standing up from a chair using your arms (e.g., wheelchair or bedside chair)?: A Little Help needed to walk in hospital room?: A Little Help needed climbing 3-5 steps with a railing? : A Little 6 Click Score: 20    End of Session Equipment Utilized During Treatment: Gait belt Activity Tolerance: Patient tolerated treatment well Patient left: in bed;with call bell/phone within reach;with family/visitor present Nurse Communication: Mobility status PT Visit Diagnosis: Other abnormalities of gait and mobility (R26.89);Muscle weakness (generalized) (M62.81);Difficulty in walking, not elsewhere classified (R26.2);Pain Pain - Right/Left: Right Pain - part of body: Knee     Time: [id number] PT Time Calculation (min) (ACUTE ONLY): 38 min  Charges:  $Gait Training: 23-37 mins $Therapeutic Exercise: 8-22 mins                     Garnette Greb M. Fairly IV, PT, DPT Physical Therapist- [facility]  01/31/2021, 11:03 AM

## 2021-01-31 NOTE — Progress Notes (Signed)
°  Subjective: 1 Day Post-Op Procedure(s) (LRB): COMPUTER ASSISTED TOTAL KNEE ARTHROPLASTY (Right) Wife at bedside.  Patient reports pain as well-controlled.   Patient is well, and has had no acute complaints or problems Plan is to go Home after hospital stay. Negative for chest pain and shortness of breath Fever: no Gastrointestinal: negative for nausea and vomiting.  Patient has not had a bowel movement.  Objective: Vital signs in last 24 hours: Temp:  [97.3 F (36.3 C)-99.7 F (37.6 C)] 97.9 F (36.6 C) (01/31 0757) Pulse Rate:  [63-87] 68 (01/31 0757) Resp:  [14-20] 20 (01/31 0757) BP: (112-177)/(60-86) 131/75 (01/31 0757) SpO2:  [94 %-100 %] 97 % (01/31 0757) Weight:  [63 kg] 63 kg (01/30 1543)  Intake/Output from previous day:  Intake/Output Summary (Last 24 hours) at 01/31/2021 0819 Last data filed at 01/31/2021 0446 Gross per 24 hour  Intake 3170.09 ml  Output 1000 ml  Net 2170.09 ml    Intake/Output this shift: No intake/output data recorded.  Labs: No results for input(s): HGB in the last 72 hours. No results for input(s): WBC, RBC, HCT, PLT in the last 72 hours. No results for input(s): NA, K, CL, CO2, BUN, CREATININE, GLUCOSE, CALCIUM in the last 72 hours. No results for input(s): LABPT, INR in the last 72 hours.   EXAM General - Patient is Alert, Appropriate, and Oriented Extremity - Neurovascular intact Dorsiflexion/Plantar flexion intact Compartment soft Dressing/Incision -Postoperative dressing remains in place., Polar Care in place and working. , Hemovac in place. , Following removal of post-op dressing, minimal bloody drainage noted  Motor Function - intact, moving foot and toes well on exam. Able to perform independent SLR.  Cardiovascular- Regular rate and rhythm, no murmurs/rubs/gallops Respiratory- Lungs clear to auscultation bilaterally Gastrointestinal- soft, nontender, and active bowel sounds   Assessment/Plan: 1 Day Post-Op Procedure(s)  (LRB): COMPUTER ASSISTED TOTAL KNEE ARTHROPLASTY (Right) Principal Problem:   Total knee replacement status  Estimated body mass index is 22.44 kg/m as calculated from the following:   Height as of this encounter: 5\' 6"  (1.676 m).   Weight as of this encounter: 63 kg. Advance diet Up with therapy  Anticipate d/c later today.   Post-op dressing removed. , Hemovac removed., and Mini compression dressing applied.   DVT Prophylaxis - Lovenox, Ted hose, and SCDs Weight-Bearing as tolerated to right leg  , PA-C [facility] 01/31/2021, 8:19 AM

## 2021-01-31 NOTE — Discharge Summary (Signed)
Physician Discharge Summary  Patient ID: Carlos Neal. MRN: [id number] DOB/AGE: [birth year] 79 y.o.  Admit date: 01/30/2021 Discharge date: 01/31/2021  Admission Diagnoses:  Total knee replacement status [Z96.659]  Surgeries:Procedure(s):  Right total knee arthroplasty using computer-assisted navigation   SURGEON:  Jena Gauss. M.D.   ASSISTANT: Baldwin Jamaica, PA-C (present and scrubbed throughout the case, critical for assistance with exposure, retraction, instrumentation, and closure)   ANESTHESIA: spinal   ESTIMATED BLOOD LOSS: 50 mL   FLUIDS REPLACED: 1000 mL of crystalloid   TOURNIQUET TIME: 88 minutes   DRAINS: 2 medium Hemovac drains   SOFT TISSUE RELEASES: Anterior cruciate ligament, posterior cruciate ligament, deep medial collateral ligament, patellofemoral ligament   IMPLANTS UTILIZED: DePuy Attune size 6 posterior stabilized femoral component (cemented), size 6 rotating platform tibial component (cemented), 38 mm medialized dome patella (cemented), and a 3 mm stabilized rotating platform polyethylene insert.  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   Osteoarthritis of right knee 11/02/2020   B12 deficiency 03/16/2020   BPH associated with nocturia 03/16/2020   Chronic pain syndrome 12/23/2019   Pharmacologic therapy 12/23/2019   Disorder of skeletal system 12/23/2019   Problems influencing health status 12/23/2019   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HCC) 12/23/2019   Chronic lower extremity pain (1ry area of Pain) (Right) 12/23/2019   Chronic low back pain (2ry area of Pain) (Bilateral) (R>L) w/ sciatica (Right) 12/23/2019   Abnormal MRI, lumbar spine (12/04/2019) 12/23/2019   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) 12/23/2019   Retrolisthesis (T12-L1, L1-2) 12/23/2019   Lumbosacral foraminal stenosis 12/23/2019   Lumbar facet arthropathy 12/23/2019   Osteoarthritis of facet  joint of lumbar spine 12/23/2019   Lumbar facet syndrome (Bilateral) 12/23/2019   Wedge compression fracture of T12 vertebra, sequela 12/23/2019   DDD (degenerative disc disease), lumbosacral 12/23/2019   DDD (degenerative disc disease), cervical 12/23/2019   Cervical facet syndrome (Right) 12/23/2019   Cervicalgia 12/23/2019   Chronic neck pain (Right) 12/23/2019   Essential hypertension 12/20/2019   GERD without esophagitis 12/20/2019   Hereditary hemochromatosis (HCC) 12/20/2019   Impotence 12/20/2019   Insomnia 12/20/2019   Osteopenia 12/20/2019   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 is an 81 y.o. male who was admitted 01/30/2021 with a diagnosis of right knee osteoarthritis and went to the operating room on 01/30/2021 and underwent right total knee arthroplasty. The patient received perioperative antibiotics for prophylaxis (see below). The patient tolerated the procedure well and was transported to PACU in stable condition. After meeting PACU criteria, the patient was subsequently transferred to the Orthopaedics/Rehabilitation unit.   The patient received DVT prophylaxis in the form of early mobilization, Lovenox, TED hose, and SCDs . A sacral pad had been placed and heels were elevated off of the bed with  rolled towels in order to protect skin integrity. Foley catheter was  discontinued on postoperative day #0. Wound drains were discontinued on postoperative day #1. The surgical incision was healing well without signs of infection.  Physical therapy was initiated postoperatively for transfers, gait training, and strengthening. Occupational therapy was initiated for activities of daily living and evaluation for assisted devices. Rehabilitation goals were reviewed in detail with the patient. The patient made steady progress with physical therapy and physical therapy recommended discharge to Home.   The patient achieved the preliminary goals of this hospitalization and was felt to be medically and orthopaedically appropriate for discharge.  He was given perioperative antibiotics:  Anti-infectives (From admission, onward)    Start     Dose/Rate Route Frequency Ordered Stop   01/30/21 2207  ceFAZolin (ANCEF) 2-4 GM/100ML-% IVPB       Note to Pharmacy: Kerman Passey, Cryst: cabinet override      01/30/21 2207 01/30/21 2227   01/30/21 1730  ceFAZolin (ANCEF) IVPB 2g/100 mL premix        2 g 200 mL/hr over 30 Minutes Intravenous Every 6 hours 01/30/21 1609 01/30/21 2257   01/30/21 1036  ceFAZolin (ANCEF) 2-4 GM/100ML-% IVPB       Note to Pharmacy: Mikey Bussing P: cabinet override      01/30/21 1036 01/30/21 1130   01/30/21 0600  ceFAZolin (ANCEF) IVPB 2g/100 mL premix        2 g 200 mL/hr over 30 Minutes Intravenous On call to O.R. 01/30/21 0006 01/30/21 1200     .  Recent vital signs:  Vitals:   01/31/21 0400 01/31/21 0757  BP: 120/70 131/75  Pulse: 87 68  Resp: 18 20  Temp: 98.3 F (36.8 C) 97.9 F (36.6 C)  SpO2: 98% 97%    Recent laboratory studies:  No results for input(s): WBC, HGB, HCT, PLT, K, CL, CO2, BUN, CREATININE, GLUCOSE, CALCIUM, LABPT, INR in the last 72 hours.  Diagnostic Studies: DG Knee Right Port  Result Date: 01/30/2021 CLINICAL DATA:  Status post right total knee arthroplasty EXAM: PORTABLE RIGHT KNEE - 1-2 VIEW COMPARISON:   None. FINDINGS: Postop change from left total knee arthroplasty identified. No signs of periprosthetic fracture or dislocation. Hardware components are in anatomic alignment. Anterior midline skin staples and surgical drain are in place. IMPRESSION: Status post left total knee arthroplasty. Electronically Signed   By: Signa Kell M.D.   On: 01/30/2021 15:21    Discharge Medications:   Allergies as of 01/31/2021   No Known Allergies      Medication List     STOP taking these medications    aspirin EC 81 MG tablet   meloxicam 15 MG tablet Commonly known as: MOBIC       TAKE these medications    acetaminophen 500 MG tablet Commonly known as: TYLENOL Take 1,000 mg by mouth every 6 (six) hours as needed for moderate pain.   brimonidine 0.1 % Soln Commonly known as: ALPHAGAN P Place 1 drop into both eyes in the morning and at bedtime.   CALCIUM PO Take 600 mg by mouth daily.   carboxymethylcellulose 0.5 % Soln Commonly known as: REFRESH PLUS Place 1 drop into both eyes daily as needed (dry eyes).   celecoxib 200 MG capsule Commonly known as: CELEBREX Take 1 capsule (200 mg total) by mouth 2 (two) times daily.   diclofenac Sodium 1 % Gel Commonly known as: VOLTAREN Apply 1 application topically 4 (four) times daily as  needed (pain).   enoxaparin 40 MG/0.4ML injection Commonly known as: LOVENOX Inject 0.4 mLs (40 mg total) into the skin daily for 14 days.   hydrochlorothiazide 25 MG tablet Commonly known as: HYDRODIURIL Take 25 mg by mouth daily. am   losartan 25 MG tablet Commonly known as: COZAAR Take 25 mg by mouth at bedtime.   Melatonin 10 MG Caps Take 10 mg by mouth at bedtime as needed (sleep).   neomycin-polymyxin-dexameth 0.1 % Oint Commonly known as: MAXITROL Place 1 application into both eyes daily as needed (irritation).   omeprazole 20 MG capsule Commonly known as: PRILOSEC Take 20 mg by mouth daily.   Osteo Bi-Flex Joint Shield Tabs Take 1  tablet by mouth daily.   oxyCODONE 5 MG immediate release tablet Commonly known as: Oxy IR/ROXICODONE Take 1 tablet (5 mg total) by mouth every 4 (four) hours as needed for severe pain.   PreserVision AREDS 2 Caps Take 1 capsule by mouth daily.   PREVAGEN PO Take 1 capsule by mouth daily.   RECLAST IV Inject into the vein. Once per year   RIBOFLAVIN PO Take 1 tablet by mouth daily.   Salonpas 3.01-07-08 % Ptch Generic drug: Camphor-Menthol-Methyl Sal Place 1 patch onto the skin daily as needed (pain).   tamsulosin 0.4 MG Caps capsule Commonly known as: FLOMAX Take 0.8 mg by mouth at bedtime.   traMADol 50 MG tablet Commonly known as: ULTRAM Take 1 tablet (50 mg total) by mouth every 4 (four) hours as needed for moderate pain.   traZODone 50 MG tablet Commonly known as: DESYREL Take 50 mg by mouth at bedtime as needed for sleep.   vitamin B-12 100 MCG tablet Commonly known as: CYANOCOBALAMIN Take 200 mcg by mouth daily.   VITAMIN D3 PO Take 1 tablet by mouth daily.               Durable Medical Equipment  (From admission, onward)           Start     Ordered   01/30/21 1610  DME Walker rolling  Once       Question:  Patient needs a walker to treat with the following condition  Answer:  Total knee replacement status   01/30/21 1609   01/30/21 1610  DME Bedside commode  Once       Question:  Patient needs a bedside commode to treat with the following condition  Answer:  Total knee replacement status   01/30/21 1609            Disposition: Home with home health PT     Follow-up Information     Madelyn FlavorsSmith, Tiamarie Furnari B, PA-C Follow up on 02/14/2021.   Specialty: Orthopedic Surgery Why: at 1:15pm Contact information: [address] [facility] [facility] [place] [address] [phone]         Donato HeinzHooten, James P, MD Follow up on 03/14/2021.   Specialty: Orthopedic Surgery Why: at 2:00pm Contact  information: [address] [facility] [place] [address] [phone]                  Lasandra BeechBENJAMIN Kindall Swaby, PA-C 01/31/2021, 8:48 AM

## 2021-01-31 NOTE — Anesthesia Postprocedure Evaluation (Signed)
Anesthesia Post Note  Patient: Carlos Neal.  Procedure(s) Performed: COMPUTER ASSISTED TOTAL KNEE ARTHROPLASTY (Right: Knee)  Patient location during evaluation: Nursing Unit Anesthesia Type: Spinal Level of consciousness: oriented and awake and alert Pain management: pain level controlled Vital Signs Assessment: post-procedure vital signs reviewed and stable Respiratory status: spontaneous breathing and respiratory function stable Cardiovascular status: blood pressure returned to baseline and stable Postop Assessment: no headache, no backache, no apparent nausea or vomiting and patient able to bend at knees Anesthetic complications: no   No notable events documented.   Last Vitals:  Vitals:   01/31/21 0000 01/31/21 0400  BP: 112/60 120/70  Pulse: 68 87  Resp: 18 18  Temp: 37.2 C 36.8 C  SpO2: 98% 98%    Last Pain:  Vitals:   01/31/21 0400  TempSrc: Temporal  PainSc:                  Jerrye Noble

## 2021-01-31 NOTE — Care Management Obs Status (Signed)
MEDICARE OBSERVATION STATUS NOTIFICATION   Patient Details  Name: Carlos Neal. MRN: [id number] Date of Birth: 1940-10-04   Medicare Observation Status Notification Given:  Yes    Marlowe Sax, RN 01/31/2021, 10:48 AM

## 2021-01-31 NOTE — Evaluation (Signed)
Occupational Therapy Evaluation Patient Details Name: Carlos Neal. MRN: [id number] DOB: 1940/04/19 Today's Date: 01/31/2021   History of Present Illness Pt is 81 y.o. male s/p R TKA on 01/30/21.  Pt has PMH including: asthma, HTN, GERD, arthritis, and lumbar laminectomy/decompression at L2-L3.   Clinical Impression   Carlos Neal was seen for OT evaluation this date, POD#1 from above surgery. Pt was independent in all ADLs prior to surgery. Pt currently requires SUPERVISION + RW for toilet t/f and hand washing standing sinkside - cues for RW use. MOD I don B socks, increased time only. Pt and family instructed in polar care mgt, falls prevention strategies, home/routines modifications, DME/AE for LB bathing and dressing tasks, and compression stocking mgt. All education complete, no further skilled acute OT needs, will sign off. Do not currently anticipate any OT needs following this hospitalization.         Recommendations for follow up therapy are one component of a multi-disciplinary discharge planning process, led by the attending physician.  Recommendations may be updated based on patient status, additional functional criteria and insurance authorization.   Follow Up Recommendations  No OT follow up    Assistance Recommended at Discharge Intermittent Supervision/Assistance  Patient can return home with the following Help with stairs or ramp for entrance;Assistance with cooking/housework    Functional Status Assessment  Patient has had a recent decline in their functional status and demonstrates the ability to make significant improvements in function in a reasonable and predictable amount of time.  Equipment Recommendations  BSC/3in1;Other (comment) (2WW)    Recommendations for Other Services       Precautions / Restrictions Precautions Precautions: Knee Restrictions Weight Bearing Restrictions: Yes RLE Weight Bearing: Weight bearing as tolerated      Mobility Bed  Mobility Overal bed mobility: Modified Independent             General bed mobility comments: rails    Transfers Overall transfer level: Needs assistance Equipment used: Rolling walker (2 wheels) Transfers: Sit to/from Stand Sit to Stand: Supervision                  Balance Overall balance assessment: Needs assistance Sitting-balance support: No upper extremity supported, Feet supported Sitting balance-Leahy Scale: Normal     Standing balance support: No upper extremity supported, During functional activity Standing balance-Leahy Scale: Good                             ADL either performed or assessed with clinical judgement   ADL Overall ADL's : Needs assistance/impaired                                       General ADL Comments: SUPERVISION + RW for toilet t/f and hand washing standing sinkside - cues for RW use. MOD I don B socks, increased time only.      Pertinent Vitals/Pain Pain Assessment Pain Assessment: No/denies pain     Hand Dominance Right   Extremity/Trunk Assessment Upper Extremity Assessment Upper Extremity Assessment: Overall WFL for tasks assessed   Lower Extremity Assessment Lower Extremity Assessment: Overall WFL for tasks assessed       Communication Communication Communication: No difficulties   Cognition Arousal/Alertness: Awake/alert Behavior During Therapy: WFL for tasks assessed/performed Overall Cognitive Status: Within Functional Limits for tasks assessed  Home Living Family/patient expects to be discharged to:: Private residence Living Arrangements: Spouse/significant other Available Help at Discharge: Family Type of Home: House Home Access: Stairs to enter Secretary/administrator of Steps: 3 Entrance Stairs-Rails: None Home Layout: One level     Bathroom Shower/Tub: Producer, television/film/video:  Standard Bathroom Accessibility: Yes   Home Equipment: Agricultural consultant (2 wheels);Cane - single point;Grab bars - tub/shower          Prior Functioning/Environment Prior Level of Function : Independent/Modified Independent               ADLs Comments: Walking with a SPC prior to surgery.        OT Problem List: Decreased activity tolerance;Impaired balance (sitting and/or standing)      OT Treatment/Interventions:      OT Goals(Current goals can be found in the care plan section) Acute Rehab OT Goals Patient Stated Goal: to go home and return to golf OT Goal Formulation: With patient/family Time For Goal Achievement: 02/14/21 Potential to Achieve Goals: Good  OT Frequency:      Co-evaluation              AM-PAC OT "6 Clicks" Daily Activity     Outcome Measure Help from another person eating meals?: None Help from another person taking care of personal grooming?: A Little Help from another person toileting, which includes using toliet, bedpan, or urinal?: A Little Help from another person bathing (including washing, rinsing, drying)?: A Little Help from another person to put on and taking off regular upper body clothing?: None Help from another person to put on and taking off regular lower body clothing?: None 6 Click Score: 21   End of Session Equipment Utilized During Treatment: Rolling walker (2 wheels) Nurse Communication: Mobility status  Activity Tolerance: Patient tolerated treatment well Patient left: in bed;with call bell/phone within reach;with family/visitor present  OT Visit Diagnosis: Other abnormalities of gait and mobility (R26.89)                Time: 0630-1601 OT Time Calculation (min): 30 min Charges:  OT General Charges $OT Visit: 1 Visit OT Evaluation $OT Eval Low Complexity: 1 Low OT Treatments $Self Care/Home Management : 23-37 mins  Kathie Dike, M.S. OTR/L  01/31/21, 9:34 AM  ascom [phone]

## 2021-02-01 ENCOUNTER — Other Ambulatory Visit: Payer: Self-pay

## 2021-02-01 ENCOUNTER — Encounter: Payer: Self-pay | Admitting: Emergency Medicine

## 2021-02-01 ENCOUNTER — Emergency Department
Admission: EM | Admit: 2021-02-01 | Discharge: 2021-02-01 | Disposition: A | Discharge status: Home / Self Care | Payer: Medicare PPO | Attending: Emergency Medicine | Admitting: Emergency Medicine

## 2021-02-01 DIAGNOSIS — S81811A Laceration without foreign body, right lower leg, initial encounter: Secondary | ICD-10-CM | POA: Insufficient documentation

## 2021-02-01 DIAGNOSIS — T148XXA Other injury of unspecified body region, initial encounter: Secondary | ICD-10-CM

## 2021-02-01 DIAGNOSIS — S8991XA Unspecified injury of right lower leg, initial encounter: Secondary | ICD-10-CM | POA: Diagnosis present

## 2021-02-01 DIAGNOSIS — X58XXXA Exposure to other specified factors, initial encounter: Secondary | ICD-10-CM | POA: Diagnosis not present

## 2021-02-01 NOTE — Discharge Instructions (Signed)
As we discussed, your operative wound looks good!  Sometimes there is a small collection of fluid or blood that accumulates underneath an incision and it can leak out until the wound heals together.  This is what happened tonight.  We put a small amount of skin glue on the area that was bleeding after removing 1 staple that was not anchored in place across the wound, and you will be fine to follow-up with Dr. Ernest Pine in the morning with a phone call.  His office will be able to give you further recommendations as far as follow-up time in clinic.  Continue to take your regular medications and follow any other postoperative advice and instructions given by the orthopedics.

## 2021-02-01 NOTE — ED Notes (Addendum)
Wife expresses concern that dressing should be left in place until eval by Dr Marry Guan with his f/u appt; explained to wife importance of removing dressing to be able to assess incision site and eval for any emergent post-op bleeding; assured wife that sterile technique would be maintained and wife agrees; Rt knee honeycomb dressing saturated at bottom with blood; using sterile technique, dressing removed, site clensed with sterile saline; staples intact with no rednes or swelling, edges well approximated; noted small area oozing blood towards bottom of suture line, along edge of 1 staple; area does not stop with pressure held for several minutes; site temporarily covered with sterile gauze dressing and pt taken to room 14 for further eval by MD

## 2021-02-01 NOTE — ED Provider Notes (Signed)
[facility] Provider Note    Event Date/Time   First MD Initiated Contact with Patient 02/01/21 [phone]     (approximate)   History   No chief complaint on file.   HPI  Carlos Heitz. is a 81 y.o. male whose medical history is notable for a right total knee arthroplasty about 2 days ago.  He presents for evaluation of bleeding from the wound.  His postoperative dressing is still in place but it when he woke up during the night he discovers it was saturated with blood.  He is having no significantly worse pain than anticipated postoperatively.  He has had no fever and no significant swelling of which she is aware.  When he and his wife saw the blood he came immediately to the emergency department.     Physical Exam   Triage Vital Signs: ED Triage Vitals  Enc Vitals Group     BP 02/01/21 0345 (!) 159/87     Pulse Rate 02/01/21 0345 83     Resp 02/01/21 0345 20     Temp 02/01/21 0345 98.6 F (37 C)     Temp Source 02/01/21 0345 Oral     SpO2 02/01/21 0345 97 %     Weight 02/01/21 0353 63.5 kg (140 lb)     Height 02/01/21 0353 1.626 m (5\' 4" )     Head Circumference --      Peak Flow --      Pain Score 02/01/21 0353 9     Pain Loc --      Pain Edu? --      Excl. in GC? --     Most recent vital signs: Vitals:   02/01/21 0345  BP: (!) 159/87  Pulse: 83  Resp: 20  Temp: 98.6 F (37 C)  SpO2: 97%     General: Awake, no distress.  CV:  Good peripheral perfusion.  Normal capillary refill. Resp:  Normal effort.  Other:  Surgical dressing was taken down.  Wound is well-appearing with staples in place.  There is 1 area near the bottom of the wound where one of the staples is off midline and does not close the wound, but there is no evidence of wound dehiscence.  There is no significant or unanticipated swelling.  There is a very small area that is leaking a steady amount of blood or serosanguineous fluid.  No evidence of infection.      ED  Results / Procedures / Treatments     PROCEDURES:  Critical Care performed: No  ..Laceration Repair  Date/Time: 02/01/2021 8:12 AM Performed by: 04/01/2021, MD Authorized by: Loleta Rose, MD   Consent:    Consent obtained:  Verbal   Consent given by:  Patient Anesthesia:    Anesthesia method:  None Laceration details:    Location:  Leg   Leg location:  R lower leg   Length (cm):  1 Exploration:    Contaminated: no   Skin repair:    Repair method:  Tissue adhesive Approximation:    Approximation:  Close Repair type:    Repair type:  Simple Post-procedure details:    Dressing: replaced post-op dressing.   Procedure completion:  Tolerated well, no immediate complications   MEDICATIONS ORDERED IN ED: Medications - No data to display   IMPRESSION / MDM / ASSESSMENT AND PLAN / ED COURSE  I reviewed the triage vital signs and the nursing notes.  Differential diagnosis includes, but is not limited to, seroma, hematoma, wound dehiscence, wound infection.  Patient is well-appearing and in no distress with no unanticipated pain.  No fever.  Normal vital signs other than hypertension.  His wound is well-appearing and is as-anticipated for being recently postop.  However there is 1 area towards the bottom that is leaking a small amount of blood or serosanguineous fluid.  He is in no distress and there is no area of large swelling suggestive of a persistent bleed beneath the wound.  I think he likely has a small seroma or hematoma that was draining because the wound has not fully closed and because there is 1 staple that is slightly off midline and not holding the tissue together (although the bleed is slightly inferior to this stable).  I removed the 1 staple that was out of place and applied several layers of Dermabond over the area that is bleeding after applying a small amount of pressure and the bleeding/leaking stopped.  The patient is stable.   He and his wife will call the office of Dr. Ernest Pine in the morning to update them and ask for a follow-up appointment.  I gave my usual and customary return precautions.  No indication for consulting orthopedics or admitting him to the hospital.         FINAL CLINICAL IMPRESSION(S) / ED DIAGNOSES   Final diagnoses:  Bleeding from wound     Rx / DC Orders   ED Discharge Orders     None        Note:  This document was prepared using Dragon voice recognition software and may include unintentional dictation errors.   Loleta Rose, MD 02/01/21 [phone]

## 2021-02-01 NOTE — ED Triage Notes (Addendum)
Pt to triage via w/c with no distress noted; Left TKR Monday (Dr Ernest Pine); d/c yesterday; awoke approx PTA and noted bleeding to dressing; f/u appt in 2wks; began lovenox SQ yesterday

## 2021-02-07 ENCOUNTER — Other Ambulatory Visit (HOSPITAL_COMMUNITY): Payer: Medicare PPO

## 2021-02-20 ENCOUNTER — Ambulatory Visit: Admit: 2021-02-20 | Payer: Medicare PPO | Admitting: Orthopedic Surgery

## 2021-02-20 DIAGNOSIS — M1711 Unilateral primary osteoarthritis, right knee: Secondary | ICD-10-CM

## 2021-02-20 SURGERY — ARTHROPLASTY, KNEE, TOTAL
Anesthesia: Choice | Site: Knee | Laterality: Right

## 2021-08-22 DIAGNOSIS — M1612 Unilateral primary osteoarthritis, left hip: Secondary | ICD-10-CM | POA: Insufficient documentation

## 2021-11-09 DIAGNOSIS — G8929 Other chronic pain: Secondary | ICD-10-CM | POA: Insufficient documentation

## 2021-11-20 NOTE — Discharge Instructions (Signed)
Instructions after Total Hip Replacement     Carlos Neal P. Jean Alejos, Jr., M.D.     Dept. of Orthopaedics & Sports Medicine  [facility]  [address]  [place], [place]  [address]  Phone: [phone]   Fax: [phone]    DIET: . Drink plenty of non-alcoholic fluids. . Resume your normal diet. Include foods high in fiber.  ACTIVITY:  . You may use crutches or a walker with weight-bearing as tolerated, unless instructed otherwise. . You may be weaned off of the walker or crutches by your Physical Therapist.  . Do NOT reach below the level of your knees or cross your legs until allowed.    . Continue doing gentle exercises. Exercising will reduce the pain and swelling, increase motion, and prevent muscle weakness.   . Please continue to use the TED compression stockings for 6 weeks. You may remove the stockings at night, but should reapply them in the morning. . Do not drive or operate any equipment until instructed.  WOUND CARE:  . Continue to use ice packs periodically to reduce pain and swelling. . Keep the incision clean and dry. . You may bathe or shower after the staples are removed at the first office visit following surgery.  MEDICATIONS: . You may resume your regular medications. . Please take the pain medication as prescribed on the medication. . Do not take pain medication on an empty stomach. . You have been given a prescription for a blood thinner to prevent blood clots. Please take the medication as instructed. (NOTE: After completing a 2 week course of Lovenox, take one Enteric-coated aspirin once a day.) . Pain medications and iron supplements can cause constipation. Use a stool softener (Senokot or Colace) on a daily basis and a laxative (dulcolax or miralax) as needed. . Do not drive or drink alcoholic beverages when taking pain medications.  CALL THE OFFICE FOR: . Temperature above 101 degrees . Excessive bleeding or drainage on the dressing. . Excessive  swelling, coldness, or paleness of the toes. . Persistent nausea and vomiting.  FOLLOW-UP:  . You should have an appointment to return to the office in 6 weeks after surgery. . Arrangements have been made for continuation of Physical Therapy (either home therapy or outpatient therapy).     [facility] Department Directory         [email or web address]       [email or web address]          Cardiology  Appointments: [place] - [phone] [place] - [phone]  Endocrinology  Appointments: [place] - [phone] [place] - [phone]  Gastroenterology  Appointments: [place] - [phone] [place] - [phone]        General Surgery   Appointments: [place] - [phone]  Internal Medicine/Family Medicine  Appointments: [place] - [phone] [place] - [phone] [place] - [phone]  Metabolic and Weigh Loss Surgery  Appointments: [place] - [phone]        Neurology  Appointments: [place] - [phone] [place] - [phone]  Neurosurgery  Appointments: [place] - [phone]  Obstetrics & Gynecology  Appointments: [place] - [phone] [place] - [phone]        Pediatrics  Appointments: [place] - [phone] [place] - [phone]  Physiatry  Appointments: [place] -[phone]  Physical Therapy  Appointments: [place] - [phone] [place] - [phone]        Podiatry  Appointments: [place] - [phone] [place] - [phone]  Pulmonology  Appointments: [place] - [phone]  Rheumatology  Appointments: [place] - [phone]        [place] Location: [facility]  [address] [place], [place]  [address]  [place] Location: [facility] [address] [place], [place]  [address]  [place] Location: [facility] [address] [place], [place]  [address]    

## 2021-11-21 ENCOUNTER — Other Ambulatory Visit: Payer: Self-pay

## 2021-11-21 ENCOUNTER — Encounter
Admission: RE | Admit: 2021-11-21 | Discharge: 2021-11-21 | Disposition: A | Discharge status: Home / Self Care | Payer: Medicare PPO | Source: Ambulatory Visit | Attending: Orthopedic Surgery | Admitting: Orthopedic Surgery

## 2021-11-21 VITALS — BP 145/73 | HR 78 | Resp 18 | Wt 133.6 lb

## 2021-11-21 DIAGNOSIS — M1612 Unilateral primary osteoarthritis, left hip: Secondary | ICD-10-CM | POA: Diagnosis not present

## 2021-11-21 DIAGNOSIS — R351 Nocturia: Secondary | ICD-10-CM | POA: Insufficient documentation

## 2021-11-21 DIAGNOSIS — N401 Enlarged prostate with lower urinary tract symptoms: Secondary | ICD-10-CM | POA: Insufficient documentation

## 2021-11-21 DIAGNOSIS — E538 Deficiency of other specified B group vitamins: Secondary | ICD-10-CM | POA: Diagnosis not present

## 2021-11-21 DIAGNOSIS — I498 Other specified cardiac arrhythmias: Secondary | ICD-10-CM | POA: Diagnosis not present

## 2021-11-21 DIAGNOSIS — G894 Chronic pain syndrome: Secondary | ICD-10-CM | POA: Diagnosis not present

## 2021-11-21 DIAGNOSIS — Z01818 Encounter for other preprocedural examination: Secondary | ICD-10-CM | POA: Diagnosis not present

## 2021-11-21 DIAGNOSIS — Z01812 Encounter for preprocedural laboratory examination: Secondary | ICD-10-CM

## 2021-11-21 DIAGNOSIS — R9431 Abnormal electrocardiogram [ECG] [EKG]: Secondary | ICD-10-CM | POA: Insufficient documentation

## 2021-11-21 HISTORY — DX: Anemia, unspecified: D64.9

## 2021-11-21 LAB — CBC WITH DIFFERENTIAL/PLATELET
Abs Immature Granulocytes: 0.02 10*3/uL (ref 0.00–0.07)
Basophils Absolute: 0 10*3/uL (ref 0.0–0.1)
Basophils Relative: 1 %
Eosinophils Absolute: 0.4 10*3/uL (ref 0.0–0.5)
Eosinophils Relative: 7 %
HCT: 31.1 % — ABNORMAL LOW (ref 39.0–52.0)
Hemoglobin: 9.7 g/dL — ABNORMAL LOW (ref 13.0–17.0)
Immature Granulocytes: 0 %
Lymphocytes Relative: 26 %
Lymphs Abs: 1.4 10*3/uL (ref 0.7–4.0)
MCH: 24 pg — ABNORMAL LOW (ref 26.0–34.0)
MCHC: 31.2 g/dL (ref 30.0–36.0)
MCV: 76.8 fL — ABNORMAL LOW (ref 80.0–100.0)
Monocytes Absolute: 0.5 10*3/uL (ref 0.1–1.0)
Monocytes Relative: 9 %
Neutro Abs: 3 10*3/uL (ref 1.7–7.7)
Neutrophils Relative %: 57 %
Platelets: 336 10*3/uL (ref 150–400)
RBC: 4.05 MIL/uL — ABNORMAL LOW (ref 4.22–5.81)
RDW: 19 % — ABNORMAL HIGH (ref 11.5–15.5)
WBC: 5.3 10*3/uL (ref 4.0–10.5)
nRBC: 0 % (ref 0.0–0.2)

## 2021-11-21 LAB — URINALYSIS, ROUTINE W REFLEX MICROSCOPIC
Bacteria, UA: NONE SEEN
Bilirubin Urine: NEGATIVE
Glucose, UA: NEGATIVE mg/dL
Hgb urine dipstick: NEGATIVE
Ketones, ur: 5 mg/dL — AB
Leukocytes,Ua: NEGATIVE
Nitrite: NEGATIVE
Protein, ur: 30 mg/dL — AB
Specific Gravity, Urine: 1.041 — ABNORMAL HIGH (ref 1.005–1.030)
pH: 5 (ref 5.0–8.0)

## 2021-11-21 LAB — TYPE AND SCREEN
ABO/RH(D): B POS
Antibody Screen: NEGATIVE

## 2021-11-21 LAB — SURGICAL PCR SCREEN
MRSA, PCR: NEGATIVE
Staphylococcus aureus: NEGATIVE

## 2021-11-21 LAB — SEDIMENTATION RATE: Sed Rate: 9 mm/hr (ref 0–20)

## 2021-11-21 LAB — C-REACTIVE PROTEIN: CRP: <0.5 mg/dL (ref <1.0)

## 2021-11-21 NOTE — Patient Instructions (Addendum)
Your procedure is scheduled on: 12/04/21 - Monday Report to the Registration Desk on the 1st floor of the [facility]. To find out your arrival time, please call [phone] between 1PM - 3PM on: 12/01/21 - Friday If your arrival time is 6:00 am, do not arrive prior to that time as the [facility] entrance doors do not open until 6:00 am.  REMEMBER: Instructions that are not followed completely may result in serious medical risk, up to and including death; or upon the discretion of your surgeon and anesthesiologist your surgery may need to be rescheduled.  Do not eat food after midnight the night before surgery.  No gum chewing, lozengers or hard candies.  You may however, drink CLEAR liquids up to 2 hours before you are scheduled to arrive for your surgery. Do not drink anything within 2 hours of your scheduled arrival time.  Clear liquids include: - water  - apple juice without pulp - gatorade (not RED colors) - black coffee or tea (Do NOT add milk or creamers to the coffee or tea) Do NOT drink anything that is not on this list.  In addition, your doctor has ordered for you to drink the provided  Ensure Pre-Surgery Clear Carbohydrate Drink  Drinking this carbohydrate drink up to two hours before surgery helps to reduce insulin resistance and improve patient outcomes. Please complete drinking 2 hours prior to scheduled arrival time.  TAKE ONLY THESE MEDICATIONS THE MORNING OF SURGERY WITH A SIP OF WATER:  - brimonidine (ALPHAGAN P)   HOLD your hydrochlorothiazide (HYDRODIURIL) and losartan (COZAAR) on the morning of surgery.   Beginning 11/27/21, One week prior to surgery stop taking : meloxicam [facility])  Stop Anti-inflammatories (NSAIDS) such as Advil, Aleve, Ibuprofen, Motrin, Naproxen, Naprosyn and Aspirin based products such as Excedrin, Goodys Powder, BC Powder.  Beginning 11/27/21 , Stop ANY OVER THE COUNTER supplements until after surgery.  You may however, continue to  take Tylenol and Tramadol if needed for pain up until the day of surgery.  No Alcohol for 24 hours before or after surgery.  No Smoking including e-cigarettes for 24 hours prior to surgery.  No chewable tobacco products for at least 6 hours prior to surgery.  No nicotine patches on the day of surgery.  Do not use any "recreational" drugs for at least a week prior to your surgery.  Please be advised that the combination of cocaine and anesthesia may have negative outcomes, up to and including death. If you test positive for cocaine, your surgery will be cancelled.  On the morning of surgery brush your teeth with toothpaste and water, you may rinse your mouth with mouthwash if you wish. Do not swallow any toothpaste or mouthwash.  Use CHG Soap or wipes as directed on instruction sheet.  Do not wear jewelry, make-up, hairpins, clips or nail polish.  Do not wear lotions, powders, or perfumes.   Do not shave body from the neck down 48 hours prior to surgery just in case you cut yourself which could leave a site for infection. Also, freshly shaved skin may become irritated if using the CHG soap.  Contact lenses, hearing aids and dentures may not be worn into surgery.  Do not bring valuables to the hospital. [facility] is not responsible for any missing/lost belongings or valuables.   Notify your doctor if there is any change in your medical condition (cold, fever, infection).  Wear comfortable clothing (specific to your surgery type) to the hospital.  After  surgery, you can help prevent lung complications by doing breathing exercises.  Take deep breaths and cough every 1-2 hours. Your doctor may order a device called an Incentive Spirometer to help you take deep breaths. When coughing or sneezing, hold a pillow firmly against your incision with both hands. This is called "splinting." Doing this helps protect your incision. It also decreases belly discomfort.  If you are being admitted to  the hospital overnight, leave your suitcase in the car. After surgery it may be brought to your room.  If you are being discharged the day of surgery, you will not be allowed to drive home. You will need a responsible adult (18 years or older) to drive you home and stay with you that night.   If you are taking public transportation, you will need to have a responsible adult (18 years or older) with you. Please confirm with your physician that it is acceptable to use public transportation.   Please call the Pre-admissions Testing Dept. at [phone] if you have any questions about these instructions.  Surgery Visitation Policy:  Patients undergoing a surgery or procedure may have two family members or support persons with them as long as the person is not COVID-19 positive or experiencing its symptoms.   Inpatient Visitation:    Visiting hours are 7 a.m. to 8 p.m. Up to four visitors are allowed at one time in a patient room. The visitors may rotate out with other people during the day. One designated support person (adult) may remain overnight.  MASKING: Due to an increase in RSV rates and hospitalizations, starting Wednesday, Nov. 15, in patient care areas in which we serve newborns, infants and children, masks will be required for teammates and visitors.  Children ages 58 and under may not visit. This policy affects the following departments only:  [facility] Postpartum area Mother Baby Unit Newborn nursery/Special care nursery  Other areas: Masks continue to be strongly recommended for patient-facing teammates, visitors and patients in all other areas. Visitation is not restricted outside of the units listed above.

## 2021-12-01 NOTE — Anesthesia Preprocedure Evaluation (Addendum)
Anesthesia Evaluation  Patient identified by MRN, date of birth, ID band Patient awake    Reviewed: Allergy & Precautions, NPO status , Patient's Chart, lab work & pertinent test results  History of Anesthesia Complications Negative for: history of anesthetic complications  Airway Mallampati: III  TM Distance: >3 FB Neck ROM: full    Dental  (+) Chipped, Poor Dentition, Missing   Pulmonary neg shortness of breath, asthma    Pulmonary exam normal        Cardiovascular Exercise Tolerance: Good hypertension, (-) angina Normal cardiovascular exam     Neuro/Psych Wedge compression fracture of t11-T12 vertebra, sequela  Anterolisthesis of lumbar spine  Spinal stenosis, lumbar region, with neurogenic claudication  Lumbar radiculopathy  Foraminal stenosis of lumbosacral region  DDD (degenerative disc disease), cervical   L2-3 DECOMPRESSION, RIGHT L2-3 FAR LATERAL DISCECTOMY Right 01/15/2020  Chronic Back Pain  Neuromuscular disease  negative psych ROS   GI/Hepatic Neg liver ROS,GERD  Controlled,,  Endo/Other  negative endocrine ROS    Renal/GU      Musculoskeletal  (+) Arthritis ,  Chronic left shoulder pain   Abdominal   Peds  Hematology  (+) Blood dyscrasia, anemia 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 Ferritin level has been distantly within goal x2 years. Has not required therapeutic phlebotomy.  Hgb 9.7 - 11/06/21  microcytic anemia B12 deficiency (1,249 - 08/14/21)   Anesthesia Other Findings Past Medical History: No date: Asthma     Comment:  as a child No date: GERD (gastroesophageal reflux disease) No date: Hypertension No date: Osteoarthritis     Comment:  back and knee No date: Osteopenia No date: Osteoporosis No date: Vitamin D deficiency  Past Surgical History: 09/23/2020: BROW LIFT; Bilateral     Comment:  Procedure: BLEPHAROPTOSIS REPAIR; RESECT EX  BROW PTOSIS              REPAIR  ECTROPION REPAIR, EXTENSIVE BILATERAL;  Surgeon:               Karle Starch, MD;  Location: [facility];                Service: Ophthalmology;  Laterality: Bilateral;  wants to              be later in the schedule 01/12/2013: COLONOSCOPY No date: HYDROCELE EXCISION     Comment:  age 19 No date: INGUINAL HERNIA REPAIR; Bilateral     Comment:  as a child 01/15/2020: LUMBAR LAMINECTOMY/ DECOMPRESSION WITH MET-RX; Right     Comment:  Procedure: L2-3 DECOMPRESSION, RIGHT L2-3 FAR LATERAL               DISCECTOMY;  Surgeon: Meade Maw, MD;  Location:               [facility];  Service: Neurosurgery;  Laterality: Right;                2nd case     Reproductive/Obstetrics negative OB ROS                             Anesthesia Physical Anesthesia Plan  ASA: 3  Anesthesia Plan: Spinal   Post-op Pain Management: Regional block* and Ofirmev IV (intra-op)*   Induction: Intravenous  PONV Risk Score and Plan: 1 and Treatment may vary due to age or medical condition, Propofol infusion, TIVA and Dexamethasone  Airway Management Planned: Natural Airway and Nasal Cannula  Additional  Equipment:   Intra-op Plan:   Post-operative Plan:   Informed Consent:      Dental Advisory Given  Plan Discussed with: Anesthesiologist, CRNA and Surgeon  Anesthesia Plan Comments: (Patient reports no bleeding problems and no anticoagulant use.  Plan for spinal with backup GA  Patient consented for risks of anesthesia including but not limited to:  - adverse reactions to medications - damage to eyes, teeth, lips or other oral mucosa - nerve damage due to positioning  - risk of bleeding, infection and or nerve damage from spinal that could lead to paralysis - risk of headache or failed spinal - damage to teeth, lips or other oral mucosa - sore throat or hoarseness - damage to heart, brain, nerves, lungs, other parts of body or loss of life  Patient  voiced understanding.)        Anesthesia Quick Evaluation

## 2021-12-04 ENCOUNTER — Ambulatory Visit: Payer: Medicare PPO | Admitting: Urgent Care

## 2021-12-04 ENCOUNTER — Other Ambulatory Visit: Payer: Self-pay

## 2021-12-04 ENCOUNTER — Ambulatory Visit: Payer: Medicare PPO | Admitting: Anesthesiology

## 2021-12-04 ENCOUNTER — Encounter
Admission: RE | Disposition: A | Discharge status: Home / Self Care | Payer: Self-pay | Source: Home / Self Care | Attending: Orthopedic Surgery

## 2021-12-04 ENCOUNTER — Observation Stay: Payer: Medicare PPO

## 2021-12-04 ENCOUNTER — Encounter: Payer: Self-pay | Admitting: Orthopedic Surgery

## 2021-12-04 ENCOUNTER — Observation Stay
Admission: RE | Admit: 2021-12-04 | Discharge: 2021-12-05 | Disposition: A | Discharge status: Home / Self Care | Payer: Medicare PPO | Attending: Orthopedic Surgery | Admitting: Orthopedic Surgery

## 2021-12-04 DIAGNOSIS — Z7722 Contact with and (suspected) exposure to environmental tobacco smoke (acute) (chronic): Secondary | ICD-10-CM | POA: Insufficient documentation

## 2021-12-04 DIAGNOSIS — J45909 Unspecified asthma, uncomplicated: Secondary | ICD-10-CM | POA: Insufficient documentation

## 2021-12-04 DIAGNOSIS — Z96651 Presence of right artificial knee joint: Secondary | ICD-10-CM | POA: Insufficient documentation

## 2021-12-04 DIAGNOSIS — Z79899 Other long term (current) drug therapy: Secondary | ICD-10-CM | POA: Insufficient documentation

## 2021-12-04 DIAGNOSIS — M1612 Unilateral primary osteoarthritis, left hip: Secondary | ICD-10-CM | POA: Diagnosis present

## 2021-12-04 DIAGNOSIS — N401 Enlarged prostate with lower urinary tract symptoms: Secondary | ICD-10-CM

## 2021-12-04 DIAGNOSIS — I10 Essential (primary) hypertension: Secondary | ICD-10-CM | POA: Insufficient documentation

## 2021-12-04 DIAGNOSIS — Z96642 Presence of left artificial hip joint: Secondary | ICD-10-CM

## 2021-12-04 DIAGNOSIS — G894 Chronic pain syndrome: Secondary | ICD-10-CM

## 2021-12-04 DIAGNOSIS — E538 Deficiency of other specified B group vitamins: Secondary | ICD-10-CM

## 2021-12-04 HISTORY — PX: TOTAL HIP ARTHROPLASTY: SHX124

## 2021-12-04 SURGERY — ARTHROPLASTY, HIP, TOTAL,POSTERIOR APPROACH
Anesthesia: Spinal | Site: Hip | Laterality: Left

## 2021-12-04 MED ORDER — TRANEXAMIC ACID-NACL 1000-0.7 MG/100ML-% IV SOLN
1000.0000 mg | INTRAVENOUS | Status: AC
  Administered 2021-12-04: 1000 mg via INTRAVENOUS

## 2021-12-04 MED ORDER — BRIMONIDINE TARTRATE 0.15 % OP SOLN
1.0000 [drp] | Freq: Two times a day (BID) | OPHTHALMIC | Status: DC
  Administered 2021-12-04 – 2021-12-05 (×3): 1 [drp] via OPHTHALMIC
  Filled 2021-12-04: qty 5

## 2021-12-04 MED ORDER — GABAPENTIN 300 MG PO CAPS: 300.0000 mg | ORAL_CAPSULE | Freq: Once | ORAL | Status: AC

## 2021-12-04 MED ORDER — CEFAZOLIN SODIUM 1 G IJ SOLR
INTRAMUSCULAR | Status: AC
  Filled 2021-12-04: qty 10

## 2021-12-04 MED ORDER — SENNOSIDES-DOCUSATE SODIUM 8.6-50 MG PO TABS
1.0000 | ORAL_TABLET | Freq: Two times a day (BID) | ORAL | Status: DC
  Administered 2021-12-04 – 2021-12-05 (×2): 1 via ORAL
  Filled 2021-12-04 (×3): qty 1

## 2021-12-04 MED ORDER — GABAPENTIN 300 MG PO CAPS
ORAL_CAPSULE | ORAL | Status: AC
  Administered 2021-12-04: 300 mg via ORAL
  Filled 2021-12-04: qty 1

## 2021-12-04 MED ORDER — OXYCODONE HCL 5 MG/5ML PO SOLN: 5.0000 mg | Freq: Once | ORAL | Status: AC | PRN

## 2021-12-04 MED ORDER — ENSURE PRE-SURGERY PO LIQD
296.0000 mL | Freq: Once | ORAL | Status: AC
  Administered 2021-12-04: 296 mL via ORAL
  Filled 2021-12-04: qty 296

## 2021-12-04 MED ORDER — ENOXAPARIN SODIUM 30 MG/0.3ML IJ SOSY
30.0000 mg | PREFILLED_SYRINGE | Freq: Two times a day (BID) | INTRAMUSCULAR | Status: DC
  Administered 2021-12-05: 30 mg via SUBCUTANEOUS
  Filled 2021-12-04: qty 0.3

## 2021-12-04 MED ORDER — TRAMADOL HCL 50 MG PO TABS
50.0000 mg | ORAL_TABLET | ORAL | Status: DC | PRN
  Administered 2021-12-04: 50 mg via ORAL
  Filled 2021-12-04: qty 1

## 2021-12-04 MED ORDER — BUPIVACAINE HCL (PF) 0.5 % IJ SOLN
INTRAMUSCULAR | Status: DC | PRN
  Administered 2021-12-04: 2.8 mL

## 2021-12-04 MED ORDER — 0.9 % SODIUM CHLORIDE (POUR BTL) OPTIME
TOPICAL | Status: DC | PRN
  Administered 2021-12-04: 500 mL

## 2021-12-04 MED ORDER — ALUM & MAG HYDROXIDE-SIMETH 200-200-20 MG/5ML PO SUSP: 30.0000 mL | ORAL | Status: DC | PRN

## 2021-12-04 MED ORDER — IRRISEPT - 450ML BOTTLE WITH 0.05% CHG IN STERILE WATER, USP 99.95% OPTIME
TOPICAL | Status: DC | PRN
  Administered 2021-12-04: 450 mL

## 2021-12-04 MED ORDER — VITAMIN B-12 100 MCG PO TABS
200.0000 ug | ORAL_TABLET | Freq: Every day | ORAL | Status: DC
  Administered 2021-12-04 – 2021-12-05 (×2): 200 ug via ORAL
  Filled 2021-12-04 (×2): qty 2

## 2021-12-04 MED ORDER — SODIUM CHLORIDE 0.9 % IR SOLN
Status: DC | PRN
  Administered 2021-12-04: 3000 mL

## 2021-12-04 MED ORDER — CELECOXIB 200 MG PO CAPS
ORAL_CAPSULE | ORAL | Status: AC
  Administered 2021-12-04: 400 mg via ORAL
  Filled 2021-12-04: qty 2

## 2021-12-04 MED ORDER — OYSTER SHELL CALCIUM/D3 500-5 MG-MCG PO TABS
1.0000 | ORAL_TABLET | Freq: Every day | ORAL | Status: DC
  Administered 2021-12-04 – 2021-12-05 (×2): 1 via ORAL
  Filled 2021-12-04 (×2): qty 1

## 2021-12-04 MED ORDER — PROPOFOL 1000 MG/100ML IV EMUL
INTRAVENOUS | Status: AC
  Filled 2021-12-04: qty 100

## 2021-12-04 MED ORDER — ONDANSETRON HCL 4 MG/2ML IJ SOLN: 4.0000 mg | Freq: Four times a day (QID) | INTRAMUSCULAR | Status: DC | PRN

## 2021-12-04 MED ORDER — CEFAZOLIN SODIUM-DEXTROSE 2-4 GM/100ML-% IV SOLN
2.0000 g | Freq: Four times a day (QID) | INTRAVENOUS | Status: AC
  Administered 2021-12-04 (×2): 2 g via INTRAVENOUS
  Filled 2021-12-04 (×2): qty 100

## 2021-12-04 MED ORDER — ONDANSETRON HCL 4 MG PO TABS: 4.0000 mg | ORAL_TABLET | Freq: Four times a day (QID) | ORAL | Status: DC | PRN

## 2021-12-04 MED ORDER — TRAZODONE HCL 50 MG PO TABS: 50.0000 mg | ORAL_TABLET | Freq: Every evening | ORAL | Status: DC | PRN

## 2021-12-04 MED ORDER — OXYCODONE HCL 5 MG PO TABS
10.0000 mg | ORAL_TABLET | ORAL | Status: DC | PRN
  Administered 2021-12-05: 10 mg via ORAL
  Filled 2021-12-04: qty 2

## 2021-12-04 MED ORDER — CELECOXIB 200 MG PO CAPS: 400.0000 mg | ORAL_CAPSULE | Freq: Once | ORAL | Status: AC

## 2021-12-04 MED ORDER — ACETAMINOPHEN 10 MG/ML IV SOLN
INTRAVENOUS | Status: AC
  Filled 2021-12-04: qty 100

## 2021-12-04 MED ORDER — PROPOFOL 500 MG/50ML IV EMUL
INTRAVENOUS | Status: DC | PRN
  Administered 2021-12-04: 125 ug/kg/min via INTRAVENOUS

## 2021-12-04 MED ORDER — CEFAZOLIN SODIUM-DEXTROSE 2-4 GM/100ML-% IV SOLN
2.0000 g | INTRAVENOUS | Status: AC
  Administered 2021-12-04: 2 g via INTRAVENOUS

## 2021-12-04 MED ORDER — FLEET ENEMA 7-19 GM/118ML RE ENEM: 1.0000 | ENEMA | Freq: Once | RECTAL | Status: DC | PRN

## 2021-12-04 MED ORDER — OXYCODONE HCL 5 MG PO TABS
5.0000 mg | ORAL_TABLET | ORAL | Status: DC | PRN
  Administered 2021-12-04 – 2021-12-05 (×2): 5 mg via ORAL
  Filled 2021-12-04 (×2): qty 1

## 2021-12-04 MED ORDER — TRANEXAMIC ACID-NACL 1000-0.7 MG/100ML-% IV SOLN
INTRAVENOUS | Status: AC
  Filled 2021-12-04: qty 100

## 2021-12-04 MED ORDER — MAGNESIUM HYDROXIDE 400 MG/5ML PO SUSP
30.0000 mL | Freq: Every day | ORAL | Status: DC
  Administered 2021-12-04: 30 mL via ORAL
  Filled 2021-12-04 (×2): qty 30

## 2021-12-04 MED ORDER — HYDROCHLOROTHIAZIDE 25 MG PO TABS
25.0000 mg | ORAL_TABLET | Freq: Every day | ORAL | Status: DC
  Administered 2021-12-04 – 2021-12-05 (×2): 25 mg via ORAL
  Filled 2021-12-04 (×2): qty 1

## 2021-12-04 MED ORDER — PANTOPRAZOLE SODIUM 40 MG PO TBEC
40.0000 mg | DELAYED_RELEASE_TABLET | Freq: Two times a day (BID) | ORAL | Status: DC
  Administered 2021-12-04 – 2021-12-05 (×3): 40 mg via ORAL
  Filled 2021-12-04 (×4): qty 1

## 2021-12-04 MED ORDER — DIPHENHYDRAMINE HCL 12.5 MG/5ML PO ELIX: 12.5000 mg | ORAL_SOLUTION | ORAL | Status: DC | PRN

## 2021-12-04 MED ORDER — OXYCODONE HCL 5 MG PO TABS
5.0000 mg | ORAL_TABLET | Freq: Once | ORAL | Status: AC | PRN
  Administered 2021-12-04: 5 mg via ORAL

## 2021-12-04 MED ORDER — PROSIGHT PO TABS
1.0000 | ORAL_TABLET | Freq: Every day | ORAL | Status: DC
  Administered 2021-12-04 – 2021-12-05 (×2): 1 via ORAL
  Filled 2021-12-04 (×2): qty 1

## 2021-12-04 MED ORDER — DEXAMETHASONE SODIUM PHOSPHATE 10 MG/ML IJ SOLN
INTRAMUSCULAR | Status: AC
  Administered 2021-12-04: 8 mg via INTRAVENOUS
  Filled 2021-12-04: qty 1

## 2021-12-04 MED ORDER — OXYCODONE HCL 5 MG PO TABS
ORAL_TABLET | ORAL | Status: AC
  Filled 2021-12-04: qty 1

## 2021-12-04 MED ORDER — ACETAMINOPHEN 10 MG/ML IV SOLN: 1000.0000 mg | Freq: Once | INTRAVENOUS | Status: DC | PRN

## 2021-12-04 MED ORDER — CHLORHEXIDINE GLUCONATE 0.12 % MT SOLN
OROMUCOSAL | Status: AC
  Administered 2021-12-04: 15 mL via OROMUCOSAL
  Filled 2021-12-04: qty 15

## 2021-12-04 MED ORDER — FENTANYL CITRATE (PF) 100 MCG/2ML IJ SOLN: 25.0000 ug | INTRAMUSCULAR | Status: DC | PRN

## 2021-12-04 MED ORDER — OCUVITE-LUTEIN PO CAPS
1.0000 | ORAL_CAPSULE | Freq: Every day | ORAL | Status: DC
  Filled 2021-12-04: qty 1

## 2021-12-04 MED ORDER — DROPERIDOL 2.5 MG/ML IJ SOLN: 0.6250 mg | Freq: Once | INTRAMUSCULAR | Status: DC | PRN

## 2021-12-04 MED ORDER — HYDROMORPHONE HCL 1 MG/ML IJ SOLN: 0.5000 mg | INTRAMUSCULAR | Status: DC | PRN

## 2021-12-04 MED ORDER — ONDANSETRON HCL 4 MG/2ML IJ SOLN
INTRAMUSCULAR | Status: DC | PRN
  Administered 2021-12-04: 4 mg via INTRAVENOUS

## 2021-12-04 MED ORDER — ACETAMINOPHEN 10 MG/ML IV SOLN
1000.0000 mg | Freq: Four times a day (QID) | INTRAVENOUS | Status: AC
  Administered 2021-12-04 – 2021-12-05 (×3): 1000 mg via INTRAVENOUS
  Filled 2021-12-04 (×3): qty 100

## 2021-12-04 MED ORDER — FAMOTIDINE 20 MG PO TABS: 20.0000 mg | ORAL_TABLET | Freq: Once | ORAL | Status: AC

## 2021-12-04 MED ORDER — NEOMYCIN-POLYMYXIN B GU 40-200000 IR SOLN
Status: DC | PRN
  Administered 2021-12-04: 2 mL

## 2021-12-04 MED ORDER — FAMOTIDINE 20 MG PO TABS
ORAL_TABLET | ORAL | Status: AC
  Administered 2021-12-04: 20 mg via ORAL
  Filled 2021-12-04: qty 1

## 2021-12-04 MED ORDER — CHLORHEXIDINE GLUCONATE 4 % EX LIQD
60.0000 mL | Freq: Once | CUTANEOUS | Status: AC
  Administered 2021-12-04: 4 via TOPICAL

## 2021-12-04 MED ORDER — MENTHOL 3 MG MT LOZG: 1.0000 | LOZENGE | OROMUCOSAL | Status: DC | PRN

## 2021-12-04 MED ORDER — LACTATED RINGERS IV SOLN: INTRAVENOUS | Status: DC

## 2021-12-04 MED ORDER — ORAL CARE MOUTH RINSE: 15.0000 mL | Freq: Once | OROMUCOSAL | Status: AC

## 2021-12-04 MED ORDER — BISACODYL 10 MG RE SUPP: 10.0000 mg | Freq: Every day | RECTAL | Status: DC | PRN

## 2021-12-04 MED ORDER — PHENOL 1.4 % MT LIQD: 1.0000 | OROMUCOSAL | Status: DC | PRN

## 2021-12-04 MED ORDER — SODIUM CHLORIDE 0.9 % IV SOLN: INTRAVENOUS | Status: DC

## 2021-12-04 MED ORDER — ACETAMINOPHEN 325 MG PO TABS: 325.0000 mg | ORAL_TABLET | Freq: Four times a day (QID) | ORAL | Status: DC | PRN

## 2021-12-04 MED ORDER — FERROUS SULFATE 325 (65 FE) MG PO TABS
325.0000 mg | ORAL_TABLET | Freq: Two times a day (BID) | ORAL | Status: DC
  Administered 2021-12-04 – 2021-12-05 (×2): 325 mg via ORAL
  Filled 2021-12-04 (×2): qty 1

## 2021-12-04 MED ORDER — ACETAMINOPHEN 10 MG/ML IV SOLN
INTRAVENOUS | Status: DC | PRN
  Administered 2021-12-04: 1000 mg via INTRAVENOUS

## 2021-12-04 MED ORDER — PROMETHAZINE HCL 25 MG/ML IJ SOLN: 6.2500 mg | INTRAMUSCULAR | Status: DC | PRN

## 2021-12-04 MED ORDER — METOCLOPRAMIDE HCL 5 MG PO TABS
10.0000 mg | ORAL_TABLET | Freq: Three times a day (TID) | ORAL | Status: DC
  Administered 2021-12-04 – 2021-12-05 (×4): 10 mg via ORAL
  Filled 2021-12-04 (×4): qty 2

## 2021-12-04 MED ORDER — TRANEXAMIC ACID-NACL 1000-0.7 MG/100ML-% IV SOLN: 1000.0000 mg | Freq: Once | INTRAVENOUS | Status: DC

## 2021-12-04 MED ORDER — TAMSULOSIN HCL 0.4 MG PO CAPS
0.8000 mg | ORAL_CAPSULE | Freq: Every day | ORAL | Status: DC
  Administered 2021-12-04: 0.8 mg via ORAL
  Filled 2021-12-04: qty 2

## 2021-12-04 MED ORDER — VITAMIN D 25 MCG (1000 UNIT) PO TABS
1000.0000 [IU] | ORAL_TABLET | Freq: Every day | ORAL | Status: DC
  Administered 2021-12-04 – 2021-12-05 (×2): 1000 [IU] via ORAL
  Filled 2021-12-04 (×2): qty 1

## 2021-12-04 MED ORDER — DEXAMETHASONE SODIUM PHOSPHATE 10 MG/ML IJ SOLN: 8.0000 mg | Freq: Once | INTRAMUSCULAR | Status: AC

## 2021-12-04 MED ORDER — PHENYLEPHRINE HCL-NACL 20-0.9 MG/250ML-% IV SOLN
INTRAVENOUS | Status: DC | PRN
  Administered 2021-12-04: 50 ug/min via INTRAVENOUS

## 2021-12-04 MED ORDER — CELECOXIB 200 MG PO CAPS
200.0000 mg | ORAL_CAPSULE | Freq: Two times a day (BID) | ORAL | Status: DC
  Administered 2021-12-05: 200 mg via ORAL
  Filled 2021-12-04: qty 1

## 2021-12-04 MED ORDER — LOSARTAN POTASSIUM 25 MG PO TABS
50.0000 mg | ORAL_TABLET | Freq: Every day | ORAL | Status: DC
  Administered 2021-12-04: 50 mg via ORAL
  Filled 2021-12-04: qty 2

## 2021-12-04 MED ORDER — POLYVINYL ALCOHOL 1.4 % OP SOLN: 1.0000 [drp] | Freq: Every day | OPHTHALMIC | Status: DC | PRN

## 2021-12-04 MED ORDER — PROPOFOL 10 MG/ML IV BOLUS
INTRAVENOUS | Status: AC
  Filled 2021-12-04: qty 20

## 2021-12-04 MED ORDER — CEFAZOLIN SODIUM-DEXTROSE 2-4 GM/100ML-% IV SOLN
INTRAVENOUS | Status: AC
  Filled 2021-12-04: qty 100

## 2021-12-04 MED ORDER — CHLORHEXIDINE GLUCONATE 0.12 % MT SOLN: 15.0000 mL | Freq: Once | OROMUCOSAL | Status: AC

## 2021-12-04 SURGICAL SUPPLY — 59 items
BLADE SAW 90X25X1.19 OSCILLAT (BLADE) ×1 IMPLANT
COVER MAYO STAND STRL (DRAPES) IMPLANT
DRAPE 3/4 80X56 (DRAPES) ×1 IMPLANT
DRAPE INCISE IOBAN 66X60 STRL (DRAPES) ×1 IMPLANT
DRSG DERMACEA NONADH 3X8 (GAUZE/BANDAGES/DRESSINGS) ×1 IMPLANT
DRSG MEPILEX SACRM 8.7X9.8 (GAUZE/BANDAGES/DRESSINGS) ×1 IMPLANT
DRSG OPSITE POSTOP 4X12 (GAUZE/BANDAGES/DRESSINGS) ×1 IMPLANT
DRSG OPSITE POSTOP 4X14 (GAUZE/BANDAGES/DRESSINGS) IMPLANT
DRSG TEGADERM 4X4.75 (GAUZE/BANDAGES/DRESSINGS) ×1 IMPLANT
DURAPREP 26ML APPLICATOR (WOUND CARE) ×2 IMPLANT
ELECT CAUTERY BLADE 6.4 (BLADE) ×1 IMPLANT
ELECT REM PT RETURN 9FT ADLT (ELECTROSURGICAL) ×1
ELECTRODE REM PT RTRN 9FT ADLT (ELECTROSURGICAL) ×1 IMPLANT
GLOVE BIO SURGEON STRL SZ7.5 (GLOVE) ×2 IMPLANT
GLOVE BIOGEL M STRL SZ7.5 (GLOVE) ×2 IMPLANT
GLOVE BIOGEL PI IND STRL 7.5 (GLOVE) ×1 IMPLANT
GLOVE PI ORTHO PRO STRL 7.5 (GLOVE) ×2 IMPLANT
GLOVE SRG 8 PF TXTR STRL LF DI (GLOVE) ×1 IMPLANT
GLOVE SURG UNDER POLY LF SZ7.5 (GLOVE) ×1 IMPLANT
GLOVE SURG UNDER POLY LF SZ8 (GLOVE) ×1
GOWN STRL REUS W/ TWL LRG LVL3 (GOWN DISPOSABLE) ×2 IMPLANT
GOWN STRL REUS W/ TWL XL LVL3 (GOWN DISPOSABLE) ×1 IMPLANT
GOWN STRL REUS W/TWL LRG LVL3 (GOWN DISPOSABLE) ×2
GOWN STRL REUS W/TWL XL LVL3 (GOWN DISPOSABLE) ×1
GOWN TOGA ZIPPER T7+ PEEL AWAY (MISCELLANEOUS) ×1 IMPLANT
HEAD M SROM 36MM PLUS 1.5 (Hips) IMPLANT
HEMOVAC 400CC 10FR (MISCELLANEOUS) ×1 IMPLANT
HOLDER FOLEY CATH W/STRAP (MISCELLANEOUS) ×1 IMPLANT
HOOD PEEL AWAY T7 (MISCELLANEOUS) ×1 IMPLANT
IV NS IRRIG 3000ML ARTHROMATIC (IV SOLUTION) ×1 IMPLANT
KIT PEG BOARD PINK (KITS) ×1 IMPLANT
KIT TURNOVER KIT A (KITS) ×1 IMPLANT
LINER NEUTRAL 52X36X52 PLUS 4 (Liner) IMPLANT
MANIFOLD NEPTUNE II (INSTRUMENTS) ×2 IMPLANT
NDL SAFETY ECLIP 18X1.5 (MISCELLANEOUS) ×1 IMPLANT
NS IRRIG 1000ML POUR BTL (IV SOLUTION) ×1 IMPLANT
PACK HIP PROSTHESIS (MISCELLANEOUS) ×1 IMPLANT
PIN SECTOR W/GRIP ACE CUP 52MM (Hips) IMPLANT
PULSAVAC PLUS IRRIG FAN TIP (DISPOSABLE) ×1
SOL PREP PVP 2OZ (MISCELLANEOUS) ×1
SOLUTION IRRIG SURGIPHOR (IV SOLUTION) ×1 IMPLANT
SOLUTION PREP PVP 2OZ (MISCELLANEOUS) ×1 IMPLANT
SPONGE DRAIN TRACH 4X4 STRL 2S (GAUZE/BANDAGES/DRESSINGS) ×1 IMPLANT
SROM M HEAD 36MM PLUS 1.5 (Hips) ×1 IMPLANT
STAPLER SKIN PROX 35W (STAPLE) ×1 IMPLANT
STEM FEM ACTIS STD SZ4 (Stem) IMPLANT
SUT ETHIBOND #5 BRAIDED 30INL (SUTURE) ×1 IMPLANT
SUT VIC AB 0 CT1 36 (SUTURE) ×1 IMPLANT
SUT VIC AB 1 CT1 36 (SUTURE) ×2 IMPLANT
SUT VIC AB 2-0 CT1 27 (SUTURE) ×1
SUT VIC AB 2-0 CT1 TAPERPNT 27 (SUTURE) ×1 IMPLANT
SYR 20ML LL LF (SYRINGE) ×1 IMPLANT
TAPE CLOTH 3X10 WHT NS LF (GAUZE/BANDAGES/DRESSINGS) ×1 IMPLANT
TAPE TRANSPORE STRL 2 31045 (GAUZE/BANDAGES/DRESSINGS) ×1 IMPLANT
TIP FAN IRRIG PULSAVAC PLUS (DISPOSABLE) ×1 IMPLANT
TOWEL OR 17X26 4PK STRL BLUE (TOWEL DISPOSABLE) IMPLANT
TRAP FLUID SMOKE EVACUATOR (MISCELLANEOUS) ×1 IMPLANT
TRAY FOLEY MTR SLVR 16FR STAT (SET/KITS/TRAYS/PACK) ×1 IMPLANT
WATER STERILE IRR 1000ML POUR (IV SOLUTION) ×1 IMPLANT

## 2021-12-04 NOTE — Plan of Care (Signed)
  Problem: Education: Goal: Knowledge of the prescribed therapeutic regimen will improve Outcome: Progressing Goal: Understanding of discharge needs will improve Outcome: Progressing Goal: Individualized Educational Video(s) Outcome: Progressing   Problem: Activity: Goal: Ability to avoid complications of mobility impairment will improve Outcome: Progressing Goal: Ability to tolerate increased activity will improve Outcome: Progressing   Problem: Clinical Measurements: Goal: Postoperative complications will be avoided or minimized Outcome: Progressing   Problem: Pain Management: Goal: Pain level will decrease with appropriate interventions Outcome: Progressing   Problem: Skin Integrity: Goal: Will show signs of wound healing Outcome: Progressing   Problem: Education: Goal: Knowledge of General Education information will improve Description: Including pain rating scale, medication(s)/side effects and non-pharmacologic comfort measures Outcome: Progressing   Problem: Health Behavior/Discharge Planning: Goal: Ability to manage health-related needs will improve Outcome: Progressing   Problem: Clinical Measurements: Goal: Ability to maintain clinical measurements within normal limits will improve Outcome: Progressing Goal: Will remain free from infection Outcome: Progressing Goal: Diagnostic test results will improve Outcome: Progressing Goal: Respiratory complications will improve Outcome: Progressing Goal: Cardiovascular complication will be avoided Outcome: Progressing   Problem: Activity: Goal: Risk for activity intolerance will decrease Outcome: Progressing   Problem: Nutrition: Goal: Adequate nutrition will be maintained Outcome: Progressing   Problem: Coping: Goal: Level of anxiety will decrease Outcome: Progressing   Problem: Elimination: Goal: Will not experience complications related to bowel motility Outcome: Progressing Goal: Will not experience  complications related to urinary retention Outcome: Progressing   Problem: Pain Managment: Goal: General experience of comfort will improve Outcome: Progressing   Problem: Safety: Goal: Ability to remain free from injury will improve Outcome: Progressing   Problem: Skin Integrity: Goal: Risk for impaired skin integrity will decrease Outcome: Progressing   Problem: Health Behavior/Discharge Planning: Goal: Ability to manage health-related needs will improve Outcome: Progressing   Problem: Clinical Measurements: Goal: Ability to maintain clinical measurements within normal limits will improve Outcome: Progressing Goal: Will remain free from infection Outcome: Progressing Goal: Respiratory complications will improve Outcome: Progressing Goal: Cardiovascular complication will be avoided Outcome: Progressing   Problem: Activity: Goal: Risk for activity intolerance will decrease Outcome: Progressing   Problem: Nutrition: Goal: Adequate nutrition will be maintained Outcome: Progressing   Problem: Coping: Goal: Level of anxiety will decrease Outcome: Progressing   Problem: Elimination: Goal: Will not experience complications related to bowel motility Outcome: Progressing Goal: Will not experience complications related to urinary retention Outcome: Progressing   Problem: Pain Managment: Goal: General experience of comfort will improve Outcome: Progressing   Problem: Safety: Goal: Ability to remain free from injury will improve Outcome: Progressing   Problem: Skin Integrity: Goal: Risk for impaired skin integrity will decrease Outcome: Progressing

## 2021-12-04 NOTE — Anesthesia Procedure Notes (Addendum)
Spinal  Patient location during procedure: OR Start time: 12/04/2021 7:20 AM End time: 12/04/2021 7:39 AM Reason for block: surgical anesthesia Staffing Performed: resident/CRNA and anesthesiologist  Anesthesiologist: Foye Deer, MD Resident/CRNA: Junious Silk, CRNA Performed by: Foye Deer, MD Authorized by: Foye Deer, MD   Preanesthetic Checklist Completed: patient identified, IV checked, site marked, risks and benefits discussed, surgical consent, monitors and equipment checked, pre-op evaluation and timeout performed Spinal Block Patient position: sitting Prep: DuraPrep Patient monitoring: heart rate, cardiac monitor, continuous pulse ox and blood pressure Approach: midline Location: L4-5 Injection technique: single-shot Needle Needle type: Sprotte  Needle gauge: 24 G Needle length: 9 cm Assessment Sensory level: T10 Events: paresthesia, CSF return and second provider Additional Notes Pt with lumbar scoliosis. First attempt at L3-4 by [facility], CRNA with right paresthesia noted. Paresthesia quickly resolved. Second provider, Laural Benes MD, attempted midline L4-5 without success. Right paramedian attempt was successful.

## 2021-12-04 NOTE — Care Management Obs Status (Signed)
MEDICARE OBSERVATION STATUS NOTIFICATION   Patient Details  Name: Carlos Neal. MRN: [id number] Date of Birth: 17-Feb-1940   Medicare Observation Status Notification Given:  Yes    Marlowe Sax, RN 12/04/2021, 4:29 PM

## 2021-12-04 NOTE — Interval H&P Note (Signed)
History and Physical Interval Note:  12/04/2021 6:14 AM  Carlos Neal.  has presented today for surgery, with the diagnosis of PRIMARY OSTEOARTHRITIS OF LEFT HIP.Marland Kitchen  The various methods of treatment have been discussed with the patient and family. After consideration of risks, benefits and other options for treatment, the patient has consented to  Procedure(s): TOTAL HIP ARTHROPLASTY (Left) as a surgical intervention.  The patient's history has been reviewed, patient examined, no change in status, stable for surgery.  I have reviewed the patient's chart and labs.  Questions were answered to the patient's satisfaction.     Pavle Wiler P Aynslee Mulhall

## 2021-12-04 NOTE — Anesthesia Procedure Notes (Signed)
Date/Time: 12/04/2021 8:04 AM  Performed by: Junious Silk, CRNAPre-anesthesia Checklist: Patient identified, Emergency Drugs available, Suction available, Patient being monitored and Timeout performed Oxygen Delivery Method: Simple face mask

## 2021-12-04 NOTE — Evaluation (Signed)
Physical Therapy Evaluation Patient Details Name: Carlos Neal. MRN: [id number] DOB: [birth year] Today's Date: 12/04/2021  History of Present Illness  82 y/o male s/p L THA on 12/04/21. PMH: HTN, osteopenia, hx of back surgery and R TKA  Clinical Impression  Patient admitted following above procedure. PTA, patient lives with wife and recently had been using RW due to pain and reported L leg length discrepancy. Educated and provided patient and wife with handout on posterior hip precautions and HEP, both verbalized understanding. Patient with mild discomfort of L hip with mobility and at rest. Overall, able to perform bed mobility with supervision and sit to stand with min guard. Ambulated in room with RW and min guard but demos L toe walking due to habit formed prior to surgery. Cues required for heel toe gait pattern. Patient will benefit from skilled PT services during acute stay to address listed deficits.        Recommendations for follow up therapy are one component of a multi-disciplinary discharge planning process, led by the attending physician.  Recommendations may be updated based on patient status, additional functional criteria and insurance authorization.  Follow Up Recommendations Follow physician's recommendations for discharge plan and follow up therapies      Assistance Recommended at Discharge Intermittent Supervision/Assistance  Patient can return home with the following  A little help with walking and/or transfers;A little help with bathing/dressing/bathroom;Assistance with cooking/housework;Help with stairs or ramp for entrance;Assist for transportation    Equipment Recommendations Rolling Carlos Neal (2 wheels);BSC/3in1  Recommendations for Other Services       Functional Status Assessment Patient has had a recent decline in their functional status and demonstrates the ability to make significant improvements in function in a reasonable and predictable amount of time.      Precautions / Restrictions Precautions Precautions: Fall;Posterior Hip Precaution Booklet Issued: Yes (comment) Restrictions Weight Bearing Restrictions: Yes LLE Weight Bearing: Weight bearing as tolerated      Mobility  Bed Mobility Overal bed mobility: Needs Assistance Bed Mobility: Supine to Sit     Supine to sit: Supervision, HOB elevated     General bed mobility comments: supervision for safety. No assistance required    Transfers Overall transfer level: Needs assistance Equipment used: Rolling Carlos Neal (2 wheels) Transfers: Sit to/from Stand Sit to Stand: Min guard           General transfer comment: min guard for safety. Cues for maintaining posterior hip precaution when standing    Ambulation/Gait Ambulation/Gait assistance: Min guard Gait Distance (Feet): 15 Feet Assistive device: Rolling Carlos Neal (2 wheels) Gait Pattern/deviations: Step-to pattern, Decreased stride length Gait velocity: decreased     General Gait Details: min guard for safety. Patient walking on toes on L foot due to habit formed from prior to surgery. Cues for heel strike  Stairs            Wheelchair Mobility    Modified Rankin (Stroke Patients Only)       Balance Overall balance assessment: Needs assistance Sitting-balance support: No upper extremity supported, Feet supported Sitting balance-Carlos Neal: Good     Standing balance support: Bilateral upper extremity supported, Reliant on assistive device for balance Standing balance-Carlos Neal: Poor Standing balance comment: reliant on RW for support                             Pertinent Vitals/Pain Pain Assessment Pain Assessment: Faces Faces Pain Neal: Hurts a little bit  Pain Location: L hip Pain Descriptors / Indicators: Discomfort Pain Intervention(s): Monitored during session    Home Living Family/patient expects to be discharged to:: Private residence Living Arrangements:  Spouse/significant other Available Help at Discharge: Family Type of Home: House Home Access: Stairs to enter Entrance Stairs-Rails: None Entrance Stairs-Number of Steps: 1-2   Home Layout: One level Home Equipment: Agricultural consultant (2 wheels);Cane - single point;Grab bars - tub/shower      Prior Function Prior Level of Function : Independent/Modified Independent             Mobility Comments: recently has been using RW due to L LE pain and reporting "shorter L LE"       Hand Dominance        Extremity/Trunk Assessment   Upper Extremity Assessment Upper Extremity Assessment: Defer to OT evaluation    Lower Extremity Assessment Lower Extremity Assessment: LLE deficits/detail LLE Deficits / Details: deficits consistent with post op pain and weakness LLE Sensation: WNL    Cervical / Trunk Assessment Cervical / Trunk Assessment: Normal  Communication   Communication: No difficulties  Cognition Arousal/Alertness: Awake/alert Behavior During Therapy: WFL for tasks assessed/performed Overall Cognitive Status: Within Functional Limits for tasks assessed                                          General Comments      Exercises     Assessment/Plan    PT Assessment Patient needs continued PT services  PT Problem List Decreased strength;Decreased activity tolerance;Decreased range of motion;Decreased balance;Decreased mobility;Decreased knowledge of use of DME;Decreased safety awareness;Decreased knowledge of precautions       PT Treatment Interventions DME instruction;Gait training;Stair training;Functional mobility training;Therapeutic exercise;Therapeutic activities;Balance training;Patient/family education    PT Goals (Current goals can be found in the Care Plan section)  Acute Rehab PT Goals Patient Stated Goal: to go home PT Goal Formulation: With patient/family Time For Goal Achievement: 12/18/21 Potential to Achieve Goals: Good     Frequency BID     Co-evaluation               AM-PAC PT "6 Clicks" Mobility  Outcome Measure Help needed turning from your back to your side while in a flat bed without using bedrails?: A Little Help needed moving from lying on your back to sitting on the side of a flat bed without using bedrails?: A Little Help needed moving to and from a bed to a chair (including a wheelchair)?: A Little Help needed standing up from a chair using your arms (e.g., wheelchair or bedside chair)?: A Little Help needed to walk in hospital room?: A Little Help needed climbing 3-5 steps with a railing? : A Little 6 Click Score: 18    End of Session Equipment Utilized During Treatment: Gait belt Activity Tolerance: Patient tolerated treatment well Patient left: in chair;with call bell/phone within reach;with family/visitor present Nurse Communication: Mobility status PT Visit Diagnosis: Unsteadiness on feet (R26.81);Muscle weakness (generalized) (M62.81);Difficulty in walking, not elsewhere classified (R26.2)    Time: 6047-9987 PT Time Calculation (min) (ACUTE ONLY): 70 min   Charges:   PT Evaluation $PT Eval Moderate Complexity: 1 Mod PT Treatments $Therapeutic Activity: 53-67 mins        Carlos Neal A. Dan Humphreys PT, DPT [facility] - Acute Rehabilitation Services   Carlos Neal A Carlos Neal 12/04/2021, 3:58 PM

## 2021-12-04 NOTE — Progress Notes (Signed)
Spoke with the patient's spouse He lives at home with his wife, she will provide transportation Reviewed the MOON He has a rolling walker at home He will need a 3 in 1 [employer] to deliver to the room [facility] has been set up prior to surgery by the surgeons office

## 2021-12-04 NOTE — Transfer of Care (Signed)
Immediate Anesthesia Transfer of Care Note  Patient: Carlos Neal.  Procedure(s) Performed: TOTAL HIP ARTHROPLASTY (Left: Hip)  Patient Location: PACU  Anesthesia Type:Spinal  Level of Consciousness: awake, alert , and oriented  Airway & Oxygen Therapy: Patient Spontanous Breathing  Post-op Assessment: Report given to RN and Post -op Vital signs reviewed and stable  Post vital signs: Reviewed and stable  Last Vitals:  Vitals Value Taken Time  BP    Temp    Pulse    Resp    SpO2      Last Pain:  Vitals:   12/04/21 0618  TempSrc: Temporal  PainSc: 0-No pain         Complications: No notable events documented.

## 2021-12-04 NOTE — Progress Notes (Signed)
Patient is not able to walk the distance required to go the bathroom, or he/she is unable to safely negotiate stairs required to access the bathroom.  A 3in1 BSC will alleviate this problem  

## 2021-12-04 NOTE — Op Note (Signed)
OPERATIVE NOTE  DATE OF SURGERY:  12/04/2021  PATIENT NAME:  Carlos Neal.   DOB: 04-04-1940  MRN: [id number]  PRE-OPERATIVE DIAGNOSIS: Degenerative arthrosis of the left hip, primary  POST-OPERATIVE DIAGNOSIS:  Same  PROCEDURE:  Left total hip arthroplasty  SURGEON:  Jena Gauss. M.D.  ASSISTANT: Baldwin Jamaica, PA-C (present and scrubbed throughout the case, critical for assistance with exposure, retraction, instrumentation, and closure)  ANESTHESIA: spinal  ESTIMATED BLOOD LOSS: 75 mL  FLUIDS REPLACED: 1000 mL of crystalloid  DRAINS: 2 medium Hemovac drains  IMPLANTS UTILIZED: DePuy size 4 standard offset Actis femoral stem, 52 mm OD Pinnacle GRIPTION Sector acetabular component, +4 mm 10 degree Pinnacle Altrx polyethylene insert, and a 36 mm M-SPEC +1.5 mm hip ball  INDICATIONS FOR SURGERY: Carlos Neal. is a 81 y.o. year old male with a long history of progressive hip and groin  pain. X-rays demonstrated severe degenerative changes. The patient had not seen any significant improvement despite conservative nonsurgical intervention. After discussion of the risks and benefits of surgical intervention, the patient expressed understanding of the risks benefits and agree with plans for total hip arthroplasty.   The risks, benefits, and alternatives were discussed at length including but not limited to the risks of infection, bleeding, nerve injury, stiffness, blood clots, the need for revision surgery, limb length inequality, dislocation, cardiopulmonary complications, among others, and they were willing to proceed.  PROCEDURE IN DETAIL: The patient was brought into the operating room and, after adequate spinal anesthesia was achieved, the patient was placed in a right lateral decubitus position. Axillary roll was placed and all bony prominences were well-padded. The patient's left hip was cleaned and prepped with alcohol and DuraPrep and draped in the usual sterile fashion. A  "timeout" was performed as per usual protocol. A lateral curvilinear incision was made gently curving towards the posterior superior iliac spine. The IT band was incised in line with the skin incision and the fibers of the gluteus maximus were split in line.  The short external rotators were incised and their insertion to the femur and reflected posteriorly. A T type posterior capsulotomy was performed. Prior to dislocation of the femoral head, a threaded Steinmann pin was inserted through a separate stab incision into the pelvis superior to the acetabulum and bent in the form of a stylus so as to assess limb length and hip offset throughout the procedure. The femoral head was then dislocated posteriorly. Inspection of the femoral head demonstrated severe degenerative changes with collapse of articular cartilage. The femoral neck cut was performed using an oscillating saw. The anterior capsule was elevated off of the femoral neck using a periosteal elevator. Attention was then directed to the acetabulum. The remnant of the labrum was excised using electrocautery. Inspection of the acetabulum also demonstrated significant degenerative changes. The acetabulum was reamed in sequential fashion up to a 51 mm diameter. Good punctate bleeding bone was encountered. A 52 mm Pinnacle GRIPTION Sector acetabular component was positioned and impacted into place. Good scratch fit was appreciated. A +4 mm neutral polyethylene trial was inserted.  Attention was then directed to the proximal femur.  Femoral broaches were inserted in a sequential fashion up to a size 4 broach. Calcar region was planed and a trial reduction was performed using a standard offset neck and a 36 mm hip ball with a +1.5 mm neck length.  Reasonably good stability was noted but it was elected to trial a +4 mm 10 degree  trial polyethylene with a high side positioned at the 4 o'clock position.  Good equalization of limb lengths and hip offset was appreciated  and excellent stability was noted both anteriorly and posteriorly. Trial components were removed. The acetabular shell was irrigated with copious amounts of normal saline with antibiotic solution and suctioned dry. A +4 mm 10 degree Pinnacle Altrx polyethylene insert was positioned and impacted into place. Next, a size 4 standard offset Actis femoral stem was positioned with the high side at the 4 o'clock position and impacted into place. Excellent scratch fit was appreciated. A trial reduction was again performed with a 36 mm hip ball with a +1.5 mm neck length. Again, good equalization of limb lengths was appreciated and excellent stability appreciated both anteriorly and posteriorly. The hip was then dislocated and the trial hip ball was removed. The Morse taper was cleaned and dried. A 36 mm M-SPEC hip ball with a +1.5 mm neck length was placed on the trunnion and impacted into place. The hip was then reduced and placed through range of motion. Excellent stability was appreciated both anteriorly and posteriorly.  The wound was irrigated with copious amounts of normal saline followed by 450 ml of Surgiphor and suctioned dry. Good hemostasis was appreciated. The posterior capsulotomy was repaired using #5 Ethibond. Piriformis tendon was reapproximated to the undersurface of the gluteus medius tendon using #5 Ethibond. The IT band was reapproximated using interrupted sutures of #1 Vicryl. Subcutaneous tissue was approximated using first #0 Vicryl followed by #2-0 Vicryl. The skin was closed with skin staples.  The patient tolerated the procedure well and was transported to the recovery room in stable condition.   Jena Gauss., M.D.

## 2021-12-04 NOTE — H&P (Signed)
ORTHOPAEDIC HISTORY & PHYSICAL Carlos Neal, [name] - 11/28/2021 9:45 AM EST Formatting of this note is different from the original. [facility] Chief Complaint:  Chief Complaint Patient presents with Hip Pain H & P LEFT HIP  History of Present Illness:  Carlos Neal. is a 81 y.o. male that presents to clinic today for his preoperative history and evaluation. Patient presents with his wife. The patient is scheduled to undergo a left total hip arthroplasty on 12/04/21 by Dr. Ernest Pine. His pain began approximately 6 months ago without injury. The pain is located in the left hip. He describes his pain as worse with weightbearing and hip range of motion. He reports associated feeling of the left leg being shorter than the right. He denies associated numbness or tingling.  The patient's symptoms have progressed to the point that they decrease his quality of life. The patient has previously undergone conservative treatment including NSAIDS and activity modification without adequate control of his symptoms.  Denies history of lumbar fusion, significant cardiac history, or DVT. Has previously had lumbar decompression.  No known drug allergies. Has previously undergone right TKA January 2023.  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 Bluford Kaufmann (no repeat due to age) L2-3 DECOMPRESSION, RIGHT L2-3 FAR LATERAL DISCECTOMY Right 01/15/2020 Dr. Venetia Night at [facility], EYELID SURGERY Bilateral 09/23/2020 Dr Mickie Kay Right total  knee arthroplasty using computer-assisted navigation 01/30/2021 Dr Ernest Pine Bilateral left inguinal herniorrhaphy x 2  Current Medications: Current Outpatient Medications Medication Sig Dispense Refill acetaminophen (TYLENOL) 500 MG tablet Take 1,000 mg by mouth every 8 (eight) hours as needed for Pain ALPHAGAN P 0.1 % ophthalmic solution Place 1 drop into both eyes 2 (two) times daily amoxicillin (AMOXIL) 500 MG capsule Take 4 capsules 1 hour before the dental procedure 4 capsule 1 apoaequorin (PREVAGEN) capsule Take 1 capsule by mouth once daily calcium carbonate (CALCIUM 600 ORAL) Take by mouth once daily cholecalciferol (VITAMIN D3) 1000 unit tablet Take 1,000 Units by mouth once daily cyanocobalamin (VITAMIN B12) 1000 MCG tablet Take 1 tablet (1,000 mcg total) by mouth once daily 30 tablet 11 diclofenac (VOLTAREN) 1 % topical gel Apply 4 g topically 4 (four) times daily as needed. hydroCHLOROthiazide (HYDRODIURIL) 12.5 MG tablet TAKE 1 TABLET BY MOUTH ONCE DAILY 90 tablet 1 losartan (COZAAR) 50 MG tablet TAKE 1 TABLET BY MOUTH AT BEDTIME 90 tablet 1 melatonin 10 mg Tab Take 10 mg by mouth at bedtime as needed meloxicam (MOBIC) 7.5 MG tablet Take 1 tablet (7.5 mg total) by mouth once daily 30 tablet 3 traMADoL (ULTRAM) 50 mg tablet TAKE 1 TABLET BY MOUTH EVERY 6 HOURS AS NEEDED FOR PAIN 30 tablet 0 traZODone (DESYREL) 50 MG tablet TAKE ONE TABLET EACH NIGHT AS NEEDED FORSLEEP 90 tablet 1 VIT C/E/ZN/COPPR/LUTEIN/ZEAXAN (PRESERVISION AREDS 2 ORAL) Take 1 tablet by mouth once daily. vitamins-lipotropics (LIPO-FLAVONOID PLUS) 200-100 mg Take 1 tablet by mouth once daily tamsulosin (FLOMAX) 0.4 mg capsule TAKE 2 CAPSULES BY MOUTH DAILY 180 capsule 1  No current facility-administered medications for this visit.  Allergies: No Known Allergies  Social History: Social History  Socioeconomic History Marital status: Married Spouse name: Rivka Barbara Number of children: 1 Years  of education:  18 Highest education level: Master's degree (e.g., MA, MS, MEng, MEd, MSW, MBA) Occupational History Occupation: Retired- Radiation protection practitioner Tobacco Use Smoking status: Never Passive exposure: Past Smokeless tobacco: Never Vaping Use Vaping Use: Never used Substance and Sexual Activity Alcohol use: Yes Alcohol/week: 7.0 standard drinks of alcohol Types: 7 Glasses of wine per week Comment: 1 glass of wine most night Drug use: No Sexual activity: Defer Partners: Female Social History Narrative Marital status- Married Lives with wife Employment- Retired; 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  Physical Examination:  BP 110/70 (BP Location: Left upper arm, Patient Position: Sitting, BP Cuff Size: Adult)  Ht 162.6 cm (5\' 4" )  Wt 59 kg (130 lb)  BMI 22.31 kg/m  Patient is a well-developed, well-nourished male in no acute distress. Patient has normal mood and affect. Patient is alert and oriented to person, place, and time.  HEENT: Atraumatic, normocephalic. Pupils equal and reactive to light. Extraocular motion intact. Noninjected sclera.  Cardiovascular: Regular rate and rhythm, with no murmurs, rubs, or gallops. Distal pulses palpable.  Respiratory: Lungs clear to auscultation bilaterally.  Left Hip: Pelvic tilt: positive Limb lengths: Left lower extremity is shorter than the right with the patient standing Soft tissue swelling: Negative Erythema: Negative Crepitance: Negative Tenderness: Greater trochanter is nontender to palpation.  Severe pain is elicited by axial compression or extremes of rotation. Atrophy: No atrophy. Fair to good hip flexor and abductor strength. Range of Motion: EXT/FLEX: 0/0/90 ADD/ABD: 20/0/10 IR/ER: 10/0/30  Sensation is intact over the saphenous, lateral cutaneous, superficial fibular, and deep fibular nerve distributions.  Tests Performed/Reviewed: X-rays  No new radiographs were obtained today. Previous radiographs were reviewed of the left hip and revealed complete loss of superior femoral acetabular joint space with deformation of the femoral head.  Impression:  ICD-10-CM 1. Primary osteoarthritis of left hip M16.12  Plan:  The patient has end-stage degenerative changes of the left hip. It was explained to the patient that the condition is progressive in nature. Having failed conservative treatment, the patient has elected to proceed with a total joint arthroplasty. The patient will undergo a total joint arthroplasty with Dr. 12-26-1995. The risks of surgery, including blood clot and infection, were discussed with the patient. Measures to reduce these risks, including the use of anticoagulation, perioperative antibiotics, and early ambulation were discussed. The importance of postoperative physical therapy was discussed with the patient. The patient elects to proceed with surgery. The patient is instructed to stop all blood thinners prior to surgery. The patient is instructed to call the hospital the day before surgery to learn of the proper arrival time.  Contact our office with any questions or concerns. Follow up as indicated, or sooner should any new problems arise, if conditions worsen, or if they are otherwise concerned.  Ernest Pine, PA-C [facility] [address] [place], [place] Phone: [phone]  This note was generated in part with voice recognition software and I apologize for any typographical errors that were not  detected and corrected.  Electronically signed by [phone], PA at 11/28/2021 5:13 PM EST

## 2021-12-04 NOTE — Anesthesia Postprocedure Evaluation (Signed)
Anesthesia Post Note  Patient: Carlos Neal.  Procedure(s) Performed: TOTAL HIP ARTHROPLASTY (Left: Hip)  Patient location during evaluation: PACU Anesthesia Type: Spinal Level of consciousness: awake and alert Pain management: pain level controlled Vital Signs Assessment: post-procedure vital signs reviewed and stable Respiratory status: spontaneous breathing, nonlabored ventilation and respiratory function stable Cardiovascular status: blood pressure returned to baseline and stable Postop Assessment: no apparent nausea or vomiting Anesthetic complications: no   No notable events documented.   Last Vitals:  Vitals:   12/04/21 1215 12/04/21 1245  BP: (!) 155/86 (!) 163/88  Pulse: 66 70  Resp: 18   Temp: (!) 36.4 C 37.1 C  SpO2: 100% 100%    Last Pain:  Vitals:   12/04/21 1215  TempSrc:   PainSc: 2                  Foye Deer

## 2021-12-05 ENCOUNTER — Encounter: Payer: Self-pay | Admitting: Orthopedic Surgery

## 2021-12-05 DIAGNOSIS — M1612 Unilateral primary osteoarthritis, left hip: Secondary | ICD-10-CM | POA: Diagnosis not present

## 2021-12-05 MED ORDER — OXYCODONE HCL 5 MG PO TABS: 5.0000 mg | ORAL_TABLET | ORAL | 0 refills | Status: DC | PRN

## 2021-12-05 MED ORDER — TRAMADOL HCL 50 MG PO TABS: 50.0000 mg | ORAL_TABLET | ORAL | 0 refills | Status: DC | PRN

## 2021-12-05 MED ORDER — CELECOXIB 200 MG PO CAPS: 200.0000 mg | ORAL_CAPSULE | Freq: Two times a day (BID) | ORAL | 0 refills | Status: DC

## 2021-12-05 MED ORDER — ENOXAPARIN SODIUM 40 MG/0.4ML IJ SOSY: 40.0000 mg | PREFILLED_SYRINGE | INTRAMUSCULAR | 0 refills | Status: DC

## 2021-12-05 NOTE — Discharge Summary (Signed)
Physician Discharge Summary  Patient ID: Carlos Neal. MRN: [id number] DOB/AGE: 05-15-1940 81 y.o.  Admit date: 12/04/2021 Discharge date: 12/05/2021  Admission Diagnoses:  Status post total hip replacement, left [Z96.642]  Surgeries:Procedure(s):  Left total hip arthroplasty   SURGEON:  Jena Gauss. M.D.   ASSISTANT: Baldwin Jamaica, PA-C (present and scrubbed throughout the case, critical for assistance with exposure, retraction, instrumentation, and closure)   ANESTHESIA: spinal   ESTIMATED BLOOD LOSS: 75 mL   FLUIDS REPLACED: 1000 mL of crystalloid   DRAINS: 2 medium Hemovac drains   IMPLANTS UTILIZED: DePuy size 4 standard offset Actis femoral stem, 52 mm OD Pinnacle GRIPTION Sector acetabular component, +4 mm 10 degree Pinnacle Altrx polyethylene insert, and a 36 mm M-SPEC +1.5 mm hip ball  Discharge Diagnoses: Patient Active Problem List   Diagnosis Date Noted   Status post total hip replacement, left 12/04/2021   Chronic left shoulder pain 11/09/2021   Primary osteoarthritis of left hip 08/22/2021   Status post total right knee replacement 01/30/2021   B12 deficiency 03/16/2020   BPH associated with nocturia 03/16/2020   Chronic pain syndrome 12/23/2019   Pharmacologic therapy 12/23/2019   Disorder of skeletal system 12/23/2019   Problems influencing health status 12/23/2019   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HCC) 12/23/2019   Chronic lower extremity pain (1ry area of Pain) (Right) 12/23/2019   Chronic low back pain (2ry area of Pain) (Bilateral) (R>L) w/ sciatica (Right) 12/23/2019   Abnormal MRI, lumbar spine (12/04/2019) 12/23/2019   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) 12/23/2019   Retrolisthesis (T12-L1, L1-2) 12/23/2019   Lumbosacral foraminal stenosis 12/23/2019   Lumbar facet arthropathy 12/23/2019   Osteoarthritis of facet joint of lumbar spine 12/23/2019   Lumbar facet syndrome (Bilateral) 12/23/2019   Wedge compression fracture of T12  vertebra, sequela 12/23/2019   DDD (degenerative disc disease), lumbosacral 12/23/2019   DDD (degenerative disc disease), cervical 12/23/2019   Cervical facet syndrome (Right) 12/23/2019   Cervicalgia 12/23/2019   Chronic neck pain (Right) 12/23/2019   Essential hypertension 12/20/2019   GERD without esophagitis 12/20/2019   Hereditary hemochromatosis (HCC) 12/20/2019   Impotence 12/20/2019   Insomnia 12/20/2019   Osteopenia 12/20/2019   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 is an 81 y.o. male who was admitted 12/04/2021 with a diagnosis of left hip osteoarthritis and went to the operating room on 12/04/2021 and underwent left total hip arthroplasty through posterior approach. The patient received perioperative antibiotics for prophylaxis (see below). The patient tolerated the procedure well and was transported to PACU in stable condition. After meeting PACU criteria, the patient was subsequently transferred to the Orthopaedics/Rehabilitation unit.   The patient received DVT prophylaxis in the form of early mobilization, Lovenox, TED hose, and SCDs . A sacral pad had been placed and heels were elevated off  of the bed with rolled towels in order to protect skin integrity. Foley catheter was discontinued on postoperative day #0. Wound drains were discontinued on postoperative day  #1. The surgical incision was healing well without signs of infection.  Physical therapy was initiated postoperatively for transfers, gait training, and strengthening. Occupational therapy was initiated for activities of daily living and evaluation for assisted devices. Rehabilitation goals were reviewed in detail with the patient. The patient made steady progress with physical therapy and physical therapy recommended discharge to Home.   The patient achieved the preliminary goals of this hospitalization and was felt to be medically and orthopaedically appropriate for discharge.  He was given perioperative antibiotics:  Anti-infectives (From admission, onward)    Start     Dose/Rate Route Frequency Ordered Stop   12/04/21 1400  ceFAZolin (ANCEF) IVPB 2g/100 mL premix        2 g 200 mL/hr over 30 Minutes Intravenous Every 6 hours 12/04/21 1246 12/05/21 0827   12/04/21 0641  ceFAZolin (ANCEF) 2-4 GM/100ML-% IVPB       Note to Pharmacy: Gwynneth Aliment J: cabinet override      12/04/21 0641 12/04/21 0801   12/04/21 0600  ceFAZolin (ANCEF) IVPB 2g/100 mL premix        2 g 200 mL/hr over 30 Minutes Intravenous On call to O.R. 12/04/21 5638 12/04/21 0805     .  Recent vital signs:  Vitals:   12/05/21 0432 12/05/21 0824  BP: (!) 123/59 126/68  Pulse: 86 81  Resp: 17 18  Temp: 98.2 F (36.8 C) 97.7 F (36.5 C)  SpO2: 99% 99%    Recent laboratory studies:  No results for input(s): "WBC", "HGB", "HCT", "PLT", "K", "CL", "CO2", "BUN", "CREATININE", "GLUCOSE", "CALCIUM", "LABPT", "INR" in the last 72 hours.  Diagnostic Studies: DG Hip Port Unilat With Pelvis 1V Left  Result Date: 12/04/2021 CLINICAL DATA:  Status post total left hip arthroplasty. Postoperative. EXAM: DG HIP (WITH OR WITHOUT PELVIS) 1V PORT LEFT COMPARISON:  None available FINDINGS: Status post recent total left hip arthroplasty. Lucency is seen to indicate hardware failure or loosening. Posterolateral surgical skin  staples. Left hip surgical drain. Expected postoperative subcutaneous air. Mild superomedial right femoroacetabular joint space narrowing. The pubic symphysis and visualized sacroiliac joints are unremarkable. No acute fracture or dislocation. IMPRESSION: Status post recent total left hip arthroplasty without evidence of hardware failure. Electronically Signed   By: Neita Garnet M.D.   On: 12/04/2021 12:06    Discharge Medications:   Allergies as of 12/05/2021   No Known Allergies      Medication List     STOP taking these medications    meloxicam 7.5 MG tablet Commonly known as: MOBIC       TAKE these medications    acetaminophen 500 MG tablet Commonly known as: TYLENOL Take 1,000 mg by mouth every 6 (six) hours as needed for moderate pain.   brimonidine 0.1 % Soln Commonly known as: ALPHAGAN P Place 1 drop into both eyes in the morning and at bedtime.   CALCIUM PO Take 600 mg by mouth daily.   carboxymethylcellulose 0.5 % Soln Commonly known as: REFRESH PLUS Place 1 drop into both eyes daily as needed (dry eyes).   celecoxib 200 MG capsule Commonly known as: CELEBREX Take 1 capsule (200 mg total) by mouth 2 (two) times daily.   diclofenac Sodium 1 % Gel Commonly known as: VOLTAREN Apply 1 application topically 4 (four) times daily as needed (pain).  enoxaparin 40 MG/0.4ML injection Commonly known as: LOVENOX Inject 0.4 mLs (40 mg total) into the skin daily for 14 days.   hydrochlorothiazide 25 MG tablet Commonly known as: HYDRODIURIL Take 25 mg by mouth daily. Takes 1/2 tab am   losartan 25 MG tablet Commonly known as: COZAAR Take 25 mg by mouth at bedtime. Takes 2 tablets = 50 mg   Melatonin 10 MG Caps Take 10 mg by mouth at bedtime as needed (sleep).   oxyCODONE 5 MG immediate release tablet Commonly known as: Oxy IR/ROXICODONE Take 1 tablet (5 mg total) by mouth every 4 (four) hours as needed for severe pain.   PreserVision AREDS 2 Caps Take 1  capsule by mouth daily.   PREVAGEN PO Take 1 capsule by mouth daily.   RECLAST IV Inject into the vein. Once per year   RIBOFLAVIN PO Take 1 tablet by mouth daily.   Salonpas 3.01-07-08 % Ptch Generic drug: Camphor-Menthol-Methyl Sal Place 1 patch onto the skin daily as needed (pain).   tamsulosin 0.4 MG Caps capsule Commonly known as: FLOMAX Take 0.8 mg by mouth at bedtime.   traMADol 50 MG tablet Commonly known as: ULTRAM Take 1 tablet (50 mg total) by mouth every 4 (four) hours as needed for moderate pain. What changed: Another medication with the same name was added. Make sure you understand how and when to take each.   traMADol 50 MG tablet Commonly known as: ULTRAM Take 1 tablet (50 mg total) by mouth every 4 (four) hours as needed for moderate pain. What changed: You were already taking a medication with the same name, and this prescription was added. Make sure you understand how and when to take each.   traZODone 50 MG tablet Commonly known as: DESYREL Take 50 mg by mouth at bedtime as needed for sleep.   vitamin B-12 100 MCG tablet Commonly known as: CYANOCOBALAMIN Take 200 mcg by mouth daily.   VITAMIN D3 PO Take 1 tablet by mouth daily.               Durable Medical Equipment  (From admission, onward)           Start     Ordered   12/04/21 1247  DME Walker rolling  Once       Question:  Patient needs a walker to treat with the following condition  Answer:  S/P total hip arthroplasty   12/04/21 1246   12/04/21 1247  DME Bedside commode  Once       Comments: Patient is not able to walk the distance required to go the bathroom, or he/she is unable to safely negotiate stairs required to access the bathroom.  A 3in1 BSC will alleviate this problem  Question:  Patient needs a bedside commode to treat with the following condition  Answer:  S/P total hip arthroplasty   12/04/21 1246            Disposition: Home with home health PT      Follow-up Information     Hooten, Illene Labrador, MD Follow up on 01/16/2022.   Specialty: Orthopedic Surgery Why: at 10:00am Contact information: [address] [facility] [place] [address] [phone]                  Lasandra Beech, PA-C 12/05/2021, 1:07 PM

## 2021-12-05 NOTE — Plan of Care (Signed)
  Problem: Education: Goal: Knowledge of the prescribed therapeutic regimen will improve Outcome: Progressing Goal: Understanding of discharge needs will improve Outcome: Progressing Goal: Individualized Educational Video(s) Outcome: Progressing   Problem: Activity: Goal: Ability to avoid complications of mobility impairment will improve Outcome: Progressing Goal: Ability to tolerate increased activity will improve Outcome: Progressing   Problem: Clinical Measurements: Goal: Postoperative complications will be avoided or minimized Outcome: Progressing   Problem: Pain Management: Goal: Pain level will decrease with appropriate interventions Outcome: Progressing   Problem: Skin Integrity: Goal: Will show signs of wound healing Outcome: Progressing   Problem: Education: Goal: Knowledge of General Education information will improve Description: Including pain rating scale, medication(s)/side effects and non-pharmacologic comfort measures Outcome: Progressing   Problem: Health Behavior/Discharge Planning: Goal: Ability to manage health-related needs will improve Outcome: Progressing   Problem: Clinical Measurements: Goal: Ability to maintain clinical measurements within normal limits will improve Outcome: Progressing Goal: Will remain free from infection Outcome: Progressing Goal: Diagnostic test results will improve Outcome: Progressing Goal: Respiratory complications will improve Outcome: Progressing Goal: Cardiovascular complication will be avoided Outcome: Progressing   Problem: Activity: Goal: Risk for activity intolerance will decrease Outcome: Progressing   Problem: Nutrition: Goal: Adequate nutrition will be maintained Outcome: Progressing   Problem: Coping: Goal: Level of anxiety will decrease Outcome: Progressing   Problem: Elimination: Goal: Will not experience complications related to bowel motility Outcome: Progressing Goal: Will not experience  complications related to urinary retention Outcome: Progressing   Problem: Pain Managment: Goal: General experience of comfort will improve Outcome: Progressing   Problem: Safety: Goal: Ability to remain free from injury will improve Outcome: Progressing   Problem: Skin Integrity: Goal: Risk for impaired skin integrity will decrease Outcome: Progressing   Problem: Health Behavior/Discharge Planning: Goal: Ability to manage health-related needs will improve Outcome: Progressing   Problem: Clinical Measurements: Goal: Ability to maintain clinical measurements within normal limits will improve Outcome: Progressing Goal: Will remain free from infection Outcome: Progressing Goal: Respiratory complications will improve Outcome: Progressing Goal: Cardiovascular complication will be avoided Outcome: Progressing   Problem: Activity: Goal: Risk for activity intolerance will decrease Outcome: Progressing   Problem: Nutrition: Goal: Adequate nutrition will be maintained Outcome: Progressing   Problem: Coping: Goal: Level of anxiety will decrease Outcome: Progressing   Problem: Elimination: Goal: Will not experience complications related to bowel motility Outcome: Progressing Goal: Will not experience complications related to urinary retention Outcome: Progressing   Problem: Pain Managment: Goal: General experience of comfort will improve Outcome: Progressing   Problem: Safety: Goal: Ability to remain free from injury will improve Outcome: Progressing   Problem: Skin Integrity: Goal: Risk for impaired skin integrity will decrease Outcome: Progressing   

## 2021-12-05 NOTE — Progress Notes (Signed)
Physical Therapy Treatment Patient Details Name: Carlos Neal. MRN: [id number] DOB: November 07, 1940 Today's Date: 12/05/2021   History of Present Illness 81 y/o male s/p L THA on 12/04/21. PMH: HTN, osteopenia, hx of back surgery and R TKA    PT Comments    Patient in bathroom with NT on arrival and agreeable to PT tx session. Able to recall posterior hip precautions and maintain throughout session. Ambulated 450' with supervision and RW, but continues to require cues for heel toe sequencing as patient tends to toe walk due to discomfort with slight hip extension/hip in neutral position in upright standing. Able to negotiate stairs with supervision and use of cane on R and HHA on L to safely access home environment. Performed therex focused on LLE strengthening while seated in chair. Answered patient and wife's questions to best of my ability and deferred ADL questions for OT evaluation. D/c plan remains appropriate.     Recommendations for follow up therapy are one component of a multi-disciplinary discharge planning process, led by the attending physician.  Recommendations may be updated based on patient status, additional functional criteria and insurance authorization.  Follow Up Recommendations  Follow physician's recommendations for discharge plan and follow up therapies     Assistance Recommended at Discharge Intermittent Supervision/Assistance  Patient can return home with the following A little help with walking and/or transfers;A little help with bathing/dressing/bathroom;Assistance with cooking/housework;Help with stairs or ramp for entrance;Assist for transportation   Equipment Recommendations  BSC/3in1    Recommendations for Other Services       Precautions / Restrictions Precautions Precautions: Fall;Posterior Hip Precaution Booklet Issued: Yes (comment) Restrictions Weight Bearing Restrictions: Yes LLE Weight Bearing: Weight bearing as tolerated     Mobility  Bed  Mobility               General bed mobility comments: in bathroom with NT on arrival    Transfers Overall transfer level: Needs assistance Equipment used: Rolling Judyann Casasola (2 wheels) Transfers: Sit to/from Stand Sit to Stand: Supervision           General transfer comment: supervision for safety    Ambulation/Gait Ambulation/Gait assistance: Supervision Gait Distance (Feet): 450 Feet Assistive device: Rolling Griffyn Kucinski (2 wheels) Gait Pattern/deviations: Step-through pattern, Decreased stride length, Decreased stance time - left Gait velocity: decreased     General Gait Details: supervision for safety. Cues for heel toe sequencing   Stairs Stairs: Yes Stairs assistance: Supervision Stair Management: Step to pattern, Forwards, With cane (+ HHAx1) Number of Stairs: 4 General stair comments: supervision for safety. Use of cane on R side and HHA on L due to no rail at home but will have wife to assist. Mild balance deficits noted on stairs but no overt LOB   Wheelchair Mobility    Modified Rankin (Stroke Patients Only)       Balance Overall balance assessment: Needs assistance Sitting-balance support: No upper extremity supported, Feet supported Sitting balance-Leahy Scale: Good     Standing balance support: Bilateral upper extremity supported, Reliant on assistive device for balance Standing balance-Leahy Scale: Fair                              Cognition Arousal/Alertness: Awake/alert Behavior During Therapy: WFL for tasks assessed/performed Overall Cognitive Status: Within Functional Limits for tasks assessed  Exercises Total Joint Exercises Ankle Circles/Pumps: Both, 10 reps (long sitting) Quad Sets: Left, 10 reps (long sitting) Hip ABduction/ADduction: Left, 10 reps (long sitting) Long Arc Quad: Left, 10 reps, Seated    General Comments        Pertinent Vitals/Pain Pain  Assessment Pain Assessment: 0-10 Pain Score: 7  Pain Location: L hip/thigh Pain Descriptors / Indicators: Discomfort, Aching Pain Intervention(s): Monitored during session, Repositioned    Home Living                          Prior Function            PT Goals (current goals can now be found in the care plan section) Acute Rehab PT Goals Patient Stated Goal: to go home PT Goal Formulation: With patient/family Time For Goal Achievement: 12/18/21 Potential to Achieve Goals: Good Progress towards PT goals: Progressing toward goals    Frequency    BID      PT Plan Current plan remains appropriate    Co-evaluation              AM-PAC PT "6 Clicks" Mobility   Outcome Measure  Help needed turning from your back to your side while in a flat bed without using bedrails?: A Little Help needed moving from lying on your back to sitting on the side of a flat bed without using bedrails?: A Little Help needed moving to and from a bed to a chair (including a wheelchair)?: A Little Help needed standing up from a chair using your arms (e.g., wheelchair or bedside chair)?: A Little Help needed to walk in hospital room?: A Little Help needed climbing 3-5 steps with a railing? : A Little 6 Click Score: 18    End of Session   Activity Tolerance: Patient tolerated treatment well Patient left: in chair;with call bell/phone within reach;with family/visitor present Nurse Communication: Mobility status PT Visit Diagnosis: Unsteadiness on feet (R26.81);Muscle weakness (generalized) (M62.81);Difficulty in walking, not elsewhere classified (R26.2)     Time: 8333-8329 PT Time Calculation (min) (ACUTE ONLY): 54 min  Charges:  $Gait Training: 23-37 mins $Therapeutic Exercise: 23-37 mins                     Ardyth Kelso A. Dan Humphreys PT, DPT [facility] Acute [facility]    Demara Lover A Tredarius Cobern 12/05/2021, 10:04 AM

## 2021-12-05 NOTE — Evaluation (Signed)
Occupational Therapy Evaluation Patient Details Name: Carlos Neal. MRN: [id number] DOB: 1940-02-05 Today's Date: 12/05/2021   History of Present Illness 81 y/o male s/p L THA on 12/04/21. PMH: HTN, osteopenia, hx of back surgery and R TKA   Clinical Impression   Pt seated in recliner chair with wife present and all agreeable to OT evaluation. Pt lives with spouse and using AD prior to surgery for mobility and mod I for self are needed. OT reviewed hip precautions and giving multiple cues throughout session as pt continuously crossing ankles throughout session. Paper handout provided as well. OT educated and demonstrated how pt would utilize AE to increase Ind with self care tasks. Pt needing min A to thread clothing onto L foot and stands with min guard to pull pants over B hips. Pt dons pull over shirt with set up A. OT answering several self care questions that wife had as well. No further questions or concerns at this time. Pt does not need skilled acute OT intervention at this time. OT to complete order.     Recommendations for follow up therapy are one component of a multi-disciplinary discharge planning process, led by the attending physician.  Recommendations may be updated based on patient status, additional functional criteria and insurance authorization.   Follow Up Recommendations  No OT follow up     Assistance Recommended at Discharge Intermittent Supervision/Assistance  Patient can return home with the following A little help with bathing/dressing/bathroom;Help with stairs or ramp for entrance;Assist for transportation;Assistance with cooking/housework    Functional Status Assessment  Patient has had a recent decline in their functional status and demonstrates the ability to make significant improvements in function in a reasonable and predictable amount of time.  Equipment Recommendations  Tub/shower seat;Other (comment) (LH reacher ( talked to pt and caregiver about how  to purchase))       Precautions / Restrictions Precautions Precautions: Fall;Posterior Hip Precaution Booklet Issued: Yes (comment) Restrictions Weight Bearing Restrictions: Yes LLE Weight Bearing: Weight bearing as tolerated      Mobility Bed Mobility Overal bed mobility: Needs Assistance Bed Mobility: Supine to Sit     Supine to sit: Supervision, HOB elevated     General bed mobility comments: in bathroom with NT on arrival    Transfers Overall transfer level: Needs assistance Equipment used: Rolling walker (2 wheels) Transfers: Sit to/from Stand Sit to Stand: Supervision           General transfer comment: supervision for safety      Balance Overall balance assessment: Needs assistance Sitting-balance support: No upper extremity supported, Feet supported Sitting balance-Leahy Scale: Good     Standing balance support: Bilateral upper extremity supported, Reliant on assistive device for balance Standing balance-Leahy Scale: Fair Standing balance comment: reliant on RW for support                           ADL either performed or assessed with clinical judgement   ADL Overall ADL's : Needs assistance/impaired                 Upper Body Dressing : Set up;Supervision/safety;Sitting   Lower Body Dressing: Minimal assistance;Sit to/from stand                       Vision Patient Visual Report: No change from baseline              Pertinent Vitals/Pain Pain  Assessment Pain Assessment: Faces Faces Pain Scale: Hurts little more Pain Location: L hip/thigh Pain Descriptors / Indicators: Discomfort, Aching Pain Intervention(s): Repositioned, Monitored during session     Hand Dominance Right   Extremity/Trunk Assessment Upper Extremity Assessment Upper Extremity Assessment: Overall WFL for tasks assessed   Lower Extremity Assessment Lower Extremity Assessment: Defer to PT evaluation       Communication  Communication Communication: No difficulties   Cognition Arousal/Alertness: Awake/alert Behavior During Therapy: WFL for tasks assessed/performed Overall Cognitive Status: Within Functional Limits for tasks assessed                                                  Home Living Family/patient expects to be discharged to:: Private residence Living Arrangements: Spouse/significant other Available Help at Discharge: Family;Available 24 hours/day Type of Home: House Home Access: Stairs to enter [employer] of Steps: 1-2 Entrance Stairs-Rails: None Home Layout: One level     Bathroom Shower/Tub: Occupational psychologist: Standard Bathroom Accessibility: Yes   Home Equipment: Conservation officer, nature (2 wheels);Cane - single point;Grab bars - tub/shower          Prior Functioning/Environment Prior Level of Function : Independent/Modified Independent             Mobility Comments: recently has been using RW due to L LE pain and reporting "shorter L LE" ADLs Comments: Pt reports sink bathing prior to surgery but being Ind in self care.                 OT Goals(Current goals can be found in the care plan section) Acute Rehab OT Goals Patient Stated Goal: to go home OT Goal Formulation: With patient/family Time For Goal Achievement: 12/05/21 Potential to Achieve Goals: Good  OT Frequency:         AM-PAC OT "6 Clicks" Daily Activity     Outcome Measure Help from another person eating meals?: None Help from another person taking care of personal grooming?: None Help from another person toileting, which includes using toliet, bedpan, or urinal?: A Little Help from another person bathing (including washing, rinsing, drying)?: A Little Help from another person to put on and taking off regular upper body clothing?: None Help from another person to put on and taking off regular lower body clothing?: A Little 6 Click Score: 21   End of Session  Equipment Utilized During Treatment: Rolling walker (2 wheels) Nurse Communication: Mobility status  Activity Tolerance: Patient tolerated treatment well Patient left: in chair;with call bell/phone within reach;with family/visitor present                   Time: [id number] OT Time Calculation (min): 25 min Charges:  OT General Charges $OT Visit: 1 Visit OT Evaluation $OT Eval Low Complexity: 1 Low OT Treatments $Self Care/Home Management : 8-22 mins  Darleen Crocker, MS, OTR/L , CBIS ascom [phone]  12/05/21, 12:00 PM

## 2021-12-05 NOTE — Progress Notes (Signed)
  Subjective: 1 Day Post-Op Procedure(s) (LRB): TOTAL HIP ARTHROPLASTY (Left) Patient reports pain as mild in the hip but has been experiencing significant left thigh pain that began last evening.   Patient is otherwise doing well. Plan is to go Home after hospital stay. Negative for chest pain and shortness of breath Fever: no Gastrointestinal: negative for nausea and vomiting.   Patient has had a bowel movement.  Objective: Vital signs in last 24 hours: Temp:  [97.5 F (36.4 C)-98.8 F (37.1 C)] 98.2 F (36.8 C) (12/05 0432) Pulse Rate:  [62-86] 86 (12/05 0432) Resp:  [11-18] 17 (12/05 0432) BP: (123-163)/(59-88) 123/59 (12/05 0432) SpO2:  [98 %-100 %] 99 % (12/05 0432)  Intake/Output from previous day:  Intake/Output Summary (Last 24 hours) at 12/05/2021 0809 Last data filed at 12/05/2021 0534 Gross per 24 hour  Intake 2140 ml  Output 1110 ml  Net 1030 ml    Intake/Output this shift: No intake/output data recorded.  Labs: No results for input(s): "HGB" in the last 72 hours. No results for input(s): "WBC", "RBC", "HCT", "PLT" in the last 72 hours. No results for input(s): "NA", "K", "CL", "CO2", "BUN", "CREATININE", "GLUCOSE", "CALCIUM" in the last 72 hours. No results for input(s): "LABPT", "INR" in the last 72 hours.   EXAM General - Patient is Alert, Appropriate, and Oriented Extremity - Neurovascular intact Dorsiflexion/Plantar flexion intact Compartment soft Pain relieved with hip and knee flexion, nontender to PP over the left thigh Dressing/Incision -clean, dry, no drainage, Hemovac in place.  Motor Function - intact, moving foot and toes well on exam.  Cardiovascular- Regular rate and rhythm, no murmurs/rubs/gallops Respiratory- Lungs clear to auscultation bilaterally Gastrointestinal- soft, nontender, and active bowel sounds   Assessment/Plan: 1 Day Post-Op Procedure(s) (LRB): TOTAL HIP ARTHROPLASTY (Left) Principal Problem:   Status post total hip  replacement, left  Estimated body mass index is 23 kg/m as calculated from the following:   Height as of this encounter: 5\' 4"  (1.626 m).   Weight as of this encounter: 60.8 kg. Advance diet Up with therapy  Dr spoke with PT regarding patient's thigh pain. Suspected to be due to lumbar radiculopathy  Hemovac removed. and Mini compression dressing applied.   DVT Prophylaxis - Lovenox, Ted hose, and SCDs Weight-Bearing as tolerated to left leg  Ernest Pine, PA-C [facility] 12/05/2021, 8:09 AM

## 2021-12-05 NOTE — Plan of Care (Signed)
  Problem: Education: Goal: Knowledge of the prescribed therapeutic regimen will improve Outcome: Adequate for Discharge Goal: Understanding of discharge needs will improve Outcome: Adequate for Discharge Goal: Individualized Educational Video(s) Outcome: Adequate for Discharge   Problem: Activity: Goal: Ability to avoid complications of mobility impairment will improve Outcome: Adequate for Discharge Goal: Ability to tolerate increased activity will improve Outcome: Adequate for Discharge   Problem: Clinical Measurements: Goal: Postoperative complications will be avoided or minimized Outcome: Adequate for Discharge   Problem: Pain Management: Goal: Pain level will decrease with appropriate interventions Outcome: Adequate for Discharge   Problem: Skin Integrity: Goal: Will show signs of wound healing Outcome: Adequate for Discharge   Problem: Education: Goal: Knowledge of General Education information will improve Description: Including pain rating scale, medication(s)/side effects and non-pharmacologic comfort measures Outcome: Adequate for Discharge   Problem: Health Behavior/Discharge Planning: Goal: Ability to manage health-related needs will improve Outcome: Adequate for Discharge   Problem: Clinical Measurements: Goal: Ability to maintain clinical measurements within normal limits will improve Outcome: Adequate for Discharge Goal: Will remain free from infection Outcome: Adequate for Discharge Goal: Diagnostic test results will improve Outcome: Adequate for Discharge Goal: Respiratory complications will improve Outcome: Adequate for Discharge Goal: Cardiovascular complication will be avoided Outcome: Adequate for Discharge   Problem: Activity: Goal: Risk for activity intolerance will decrease Outcome: Adequate for Discharge   Problem: Nutrition: Goal: Adequate nutrition will be maintained Outcome: Adequate for Discharge   Problem: Coping: Goal: Level of  anxiety will decrease Outcome: Adequate for Discharge   Problem: Elimination: Goal: Will not experience complications related to bowel motility Outcome: Adequate for Discharge Goal: Will not experience complications related to urinary retention Outcome: Adequate for Discharge   Problem: Pain Managment: Goal: General experience of comfort will improve Outcome: Adequate for Discharge   Problem: Safety: Goal: Ability to remain free from injury will improve Outcome: Adequate for Discharge   Problem: Skin Integrity: Goal: Risk for impaired skin integrity will decrease Outcome: Adequate for Discharge   Problem: Health Behavior/Discharge Planning: Goal: Ability to manage health-related needs will improve Outcome: Adequate for Discharge   Problem: Clinical Measurements: Goal: Ability to maintain clinical measurements within normal limits will improve Outcome: Adequate for Discharge Goal: Will remain free from infection Outcome: Adequate for Discharge Goal: Respiratory complications will improve Outcome: Adequate for Discharge Goal: Cardiovascular complication will be avoided Outcome: Adequate for Discharge   Problem: Activity: Goal: Risk for activity intolerance will decrease Outcome: Adequate for Discharge   Problem: Nutrition: Goal: Adequate nutrition will be maintained Outcome: Adequate for Discharge   Problem: Coping: Goal: Level of anxiety will decrease Outcome: Adequate for Discharge   Problem: Elimination: Goal: Will not experience complications related to bowel motility Outcome: Adequate for Discharge Goal: Will not experience complications related to urinary retention Outcome: Adequate for Discharge   Problem: Pain Managment: Goal: General experience of comfort will improve Outcome: Adequate for Discharge   Problem: Safety: Goal: Ability to remain free from injury will improve Outcome: Adequate for Discharge   Problem: Skin Integrity: Goal: Risk for  impaired skin integrity will decrease Outcome: Adequate for Discharge

## 2021-12-05 NOTE — Plan of Care (Signed)
  Problem: Education: Goal: Knowledge of General Education information will improve Description Including pain rating scale, medication(s)/side effects and non-pharmacologic comfort measures Outcome: Progressing   Problem: Health Behavior/Discharge Planning: Goal: Ability to manage health-related needs will improve Outcome: Progressing   Problem: Clinical Measurements: Goal: Ability to maintain clinical measurements within normal limits will improve Outcome: Progressing Goal: Will remain free from infection Outcome: Progressing Goal: Diagnostic test results will improve Outcome: Progressing Goal: Respiratory complications will improve Outcome: Progressing Goal: Cardiovascular complication will be avoided Outcome: Progressing   Problem: Activity: Goal: Risk for activity intolerance will decrease Outcome: Progressing   Problem: Nutrition: Goal: Adequate nutrition will be maintained Outcome: Progressing   Problem: Elimination: Goal: Will not experience complications related to bowel motility Outcome: Progressing Goal: Will not experience complications related to urinary retention Outcome: Progressing   Problem: Pain Managment: Goal: General experience of comfort will improve Outcome: Progressing   Problem: Safety: Goal: Ability to remain free from injury will improve Outcome: Progressing   

## 2021-12-06 ENCOUNTER — Encounter: Payer: Self-pay | Admitting: Orthopedic Surgery

## 2021-12-06 LAB — SURGICAL PATHOLOGY

## 2022-01-03 ENCOUNTER — Emergency Department: Payer: Medicare PPO

## 2022-01-03 ENCOUNTER — Observation Stay: Payer: Medicare PPO

## 2022-01-03 ENCOUNTER — Other Ambulatory Visit
Admission: RE | Admit: 2022-01-03 | Discharge: 2022-01-03 | Disposition: A | Discharge status: Home / Self Care | Payer: Medicare PPO | Source: Ambulatory Visit | Attending: Family Medicine | Admitting: Family Medicine

## 2022-01-03 ENCOUNTER — Other Ambulatory Visit: Payer: Self-pay

## 2022-01-03 ENCOUNTER — Observation Stay
Admission: EM | Admit: 2022-01-03 | Discharge: 2022-01-06 | Disposition: A | Discharge status: Home / Self Care | Payer: Medicare PPO | Attending: Internal Medicine | Admitting: Internal Medicine

## 2022-01-03 DIAGNOSIS — K2970 Gastritis, unspecified, without bleeding: Secondary | ICD-10-CM | POA: Diagnosis not present

## 2022-01-03 DIAGNOSIS — R7989 Other specified abnormal findings of blood chemistry: Secondary | ICD-10-CM | POA: Insufficient documentation

## 2022-01-03 DIAGNOSIS — Z96642 Presence of left artificial hip joint: Secondary | ICD-10-CM | POA: Diagnosis not present

## 2022-01-03 DIAGNOSIS — Z96651 Presence of right artificial knee joint: Secondary | ICD-10-CM | POA: Insufficient documentation

## 2022-01-03 DIAGNOSIS — M5137 Other intervertebral disc degeneration, lumbosacral region: Secondary | ICD-10-CM | POA: Diagnosis present

## 2022-01-03 DIAGNOSIS — D649 Anemia, unspecified: Secondary | ICD-10-CM | POA: Diagnosis not present

## 2022-01-03 DIAGNOSIS — K5730 Diverticulosis of large intestine without perforation or abscess without bleeding: Secondary | ICD-10-CM | POA: Diagnosis not present

## 2022-01-03 DIAGNOSIS — J45909 Unspecified asthma, uncomplicated: Secondary | ICD-10-CM | POA: Insufficient documentation

## 2022-01-03 DIAGNOSIS — I10 Essential (primary) hypertension: Secondary | ICD-10-CM | POA: Insufficient documentation

## 2022-01-03 DIAGNOSIS — M5030 Other cervical disc degeneration, unspecified cervical region: Secondary | ICD-10-CM | POA: Diagnosis present

## 2022-01-03 DIAGNOSIS — Z79899 Other long term (current) drug therapy: Secondary | ICD-10-CM | POA: Insufficient documentation

## 2022-01-03 DIAGNOSIS — D509 Iron deficiency anemia, unspecified: Secondary | ICD-10-CM | POA: Insufficient documentation

## 2022-01-03 DIAGNOSIS — R0609 Other forms of dyspnea: Secondary | ICD-10-CM | POA: Insufficient documentation

## 2022-01-03 DIAGNOSIS — K640 First degree hemorrhoids: Secondary | ICD-10-CM | POA: Insufficient documentation

## 2022-01-03 DIAGNOSIS — R799 Abnormal finding of blood chemistry, unspecified: Secondary | ICD-10-CM | POA: Diagnosis present

## 2022-01-03 DIAGNOSIS — G894 Chronic pain syndrome: Secondary | ICD-10-CM | POA: Diagnosis present

## 2022-01-03 DIAGNOSIS — K449 Diaphragmatic hernia without obstruction or gangrene: Secondary | ICD-10-CM | POA: Insufficient documentation

## 2022-01-03 LAB — COMPREHENSIVE METABOLIC PANEL
ALT: 6 U/L (ref 0–44)
AST: 16 U/L (ref 15–41)
Albumin: 3.8 g/dL (ref 3.5–5.0)
Alkaline Phosphatase: 50 U/L (ref 38–126)
Anion gap: 9 (ref 5–15)
BUN: 31 mg/dL — ABNORMAL HIGH (ref 8–23)
CO2: 23 mmol/L (ref 22–32)
Calcium: 8.7 mg/dL — ABNORMAL LOW (ref 8.9–10.3)
Chloride: 104 mmol/L (ref 98–111)
Creatinine, Ser: 0.89 mg/dL (ref 0.61–1.24)
GFR, Estimated: >60 mL/min (ref >60)
Glucose, Bld: 114 mg/dL — ABNORMAL HIGH (ref 70–99)
Potassium: 3.9 mmol/L (ref 3.5–5.1)
Sodium: 136 mmol/L (ref 135–145)
Total Bilirubin: 0.8 mg/dL (ref 0.3–1.2)
Total Protein: 6.3 g/dL — ABNORMAL LOW (ref 6.5–8.1)

## 2022-01-03 LAB — CBC
HCT: 17.7 % — ABNORMAL LOW (ref 39.0–52.0)
Hemoglobin: 5.2 g/dL — ABNORMAL LOW (ref 13.0–17.0)
MCH: 23.3 pg — ABNORMAL LOW (ref 26.0–34.0)
MCHC: 29.4 g/dL — ABNORMAL LOW (ref 30.0–36.0)
MCV: 79.4 fL — ABNORMAL LOW (ref 80.0–100.0)
Platelets: 370 10*3/uL (ref 150–400)
RBC: 2.23 MIL/uL — ABNORMAL LOW (ref 4.22–5.81)
RDW: 18 % — ABNORMAL HIGH (ref 11.5–15.5)
WBC: 5.9 10*3/uL (ref 4.0–10.5)
nRBC: 0 % (ref 0.0–0.2)

## 2022-01-03 LAB — IRON AND TIBC
Iron: 20 ug/dL — ABNORMAL LOW (ref 45–182)
Saturation Ratios: 6 % — ABNORMAL LOW (ref 17.9–39.5)
TIBC: 328 ug/dL (ref 250–450)
UIBC: 308 ug/dL

## 2022-01-03 LAB — RETICULOCYTES
Immature Retic Fract: 30 % — ABNORMAL HIGH (ref 2.3–15.9)
RBC.: 2.12 MIL/uL — ABNORMAL LOW (ref 4.22–5.81)
Retic Count, Absolute: 90.5 10*3/uL (ref 19.0–186.0)
Retic Ct Pct: 4.3 % — ABNORMAL HIGH (ref 0.4–3.1)

## 2022-01-03 LAB — D-DIMER, QUANTITATIVE: D-Dimer, Quant: 2.11 ug/mL-FEU — ABNORMAL HIGH (ref 0.00–0.50)

## 2022-01-03 LAB — FERRITIN: Ferritin: 6 ng/mL — ABNORMAL LOW (ref 24–336)

## 2022-01-03 MED ORDER — OXYCODONE HCL 5 MG PO TABS: 5.0000 mg | ORAL_TABLET | ORAL | Status: DC | PRN

## 2022-01-03 MED ORDER — TRAZODONE HCL 50 MG PO TABS: 50.0000 mg | ORAL_TABLET | Freq: Every evening | ORAL | Status: DC | PRN

## 2022-01-03 MED ORDER — HYDROCHLOROTHIAZIDE 12.5 MG PO TABS
12.5000 mg | ORAL_TABLET | Freq: Every day | ORAL | Status: DC
  Administered 2022-01-06: 12.5 mg via ORAL
  Filled 2022-01-03 (×2): qty 1

## 2022-01-03 MED ORDER — ONDANSETRON HCL 4 MG/2ML IJ SOLN: 4.0000 mg | Freq: Four times a day (QID) | INTRAMUSCULAR | Status: DC | PRN

## 2022-01-03 MED ORDER — TAMSULOSIN HCL 0.4 MG PO CAPS
0.8000 mg | ORAL_CAPSULE | Freq: Every day | ORAL | Status: DC
  Administered 2022-01-03 – 2022-01-05 (×3): 0.8 mg via ORAL
  Filled 2022-01-03 (×3): qty 2

## 2022-01-03 MED ORDER — ONDANSETRON HCL 4 MG PO TABS: 4.0000 mg | ORAL_TABLET | Freq: Four times a day (QID) | ORAL | Status: DC | PRN

## 2022-01-03 MED ORDER — ACETAMINOPHEN 325 MG PO TABS
650.0000 mg | ORAL_TABLET | Freq: Four times a day (QID) | ORAL | Status: DC | PRN
  Administered 2022-01-05: 650 mg via ORAL
  Filled 2022-01-03: qty 2

## 2022-01-03 MED ORDER — MORPHINE SULFATE (PF) 2 MG/ML IV SOLN: 2.0000 mg | INTRAVENOUS | Status: DC | PRN

## 2022-01-03 MED ORDER — LOSARTAN POTASSIUM 50 MG PO TABS
50.0000 mg | ORAL_TABLET | Freq: Every day | ORAL | Status: DC
  Administered 2022-01-03 – 2022-01-05 (×3): 50 mg via ORAL
  Filled 2022-01-03 (×3): qty 1

## 2022-01-03 MED ORDER — ACETAMINOPHEN 650 MG RE SUPP: 650.0000 mg | Freq: Four times a day (QID) | RECTAL | Status: DC | PRN

## 2022-01-03 MED ORDER — SODIUM CHLORIDE 0.9 % IV SOLN
10.0000 mL/h | Freq: Once | INTRAVENOUS | Status: AC
  Administered 2022-01-03: 10 mL/h via INTRAVENOUS

## 2022-01-03 MED ORDER — PANTOPRAZOLE SODIUM 40 MG IV SOLR
40.0000 mg | Freq: Every day | INTRAVENOUS | Status: DC
  Administered 2022-01-03 – 2022-01-05 (×3): 40 mg via INTRAVENOUS
  Filled 2022-01-03 (×3): qty 10

## 2022-01-03 NOTE — H&P (Incomplete)
History and Physical    Patient: Carlos Neal. LOV:[id number] DOB: 1940/01/10 DOA: 01/03/2022 DOS: the patient was seen and examined on 01/03/2022 PCP: Dion Body, MD  Patient coming from: Home  Chief Complaint:  Chief Complaint  Patient presents with   abnormal labs    HPI: Carlos Neal. is a 82 y.o. 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  Patient was recovering uneventfully from his surgery, walking a mile a day for rehab until he awoke on 1/1 with shortness of breath on minimal exertion  walking from the bedroom to the bathroom.Marland Kitchen  He denies chest pain or palpitation and denies pain or swelling in the lower legs.  He completed his course of prophylactic Lovenox.  He endorses 2 episodes of black stools over the past 5 days.  Denies nausea or vomiting.  He denies chest pain.  Denies lower extremity pain or swelling.  Last colonoscopy 2015 that was positive for diverticulosis only with no repeat planned due to age. ED course and data reviewed: Vitals within normal limits.  Hemoglobin 5.2, down from 9.7 a month ago.  MCV 17.7.  CMP unremarkable.  Stool guaiac in the ED was negative.  Chest x-ray shows no signs of pulmonary edema or focal pulmonary consolidation. EKG pending  Venous Dopplers ordered currently pending (ordered due to elevated D-dimer in urgent care).  Blood transfusion was ordered.  Hospitalist consulted for admission.   Review of Systems: As mentioned in the history of present illness. All other systems reviewed and are  negative.  Past Medical History:  Diagnosis Date   Anemia    Asthma    as a child   GERD (gastroesophageal reflux disease)    Hypertension    Osteoarthritis    back and knee   Osteopenia    Osteoporosis    Vitamin D deficiency    Past Surgical History:  Procedure Laterality Date   BROW LIFT Bilateral 09/23/2020   Procedure: BLEPHAROPTOSIS REPAIR; RESECT EX  BROW PTOSIS REPAIR ECTROPION REPAIR, EXTENSIVE BILATERAL;  Surgeon: Karle Starch, MD;  Location: [place];  Service: Ophthalmology;  Laterality: Bilateral;  wants to be later in the schedule   COLONOSCOPY  01/12/2013   HYDROCELE EXCISION     age 4   INGUINAL HERNIA REPAIR Bilateral    as a child   KNEE ARTHROPLASTY Right 01/30/2021   Procedure: COMPUTER ASSISTED TOTAL KNEE ARTHROPLASTY;  Surgeon: Dereck Leep, MD;  Location: [facility];  Service: Orthopedics;  Laterality: Right;   LUMBAR LAMINECTOMY/ DECOMPRESSION WITH MET-RX Right 01/15/2020   Procedure: L2-3 DECOMPRESSION, RIGHT L2-3 FAR LATERAL DISCECTOMY;  Surgeon: Meade Maw, MD;  Location: [facility];  Service: Neurosurgery;  Laterality: Right;  2nd case   TOTAL HIP ARTHROPLASTY Left 12/04/2021   Procedure: TOTAL HIP ARTHROPLASTY;  Surgeon: Dereck Leep, MD;  Location: [facility];  Service: Orthopedics;  Laterality: Left;   Social History:  reports that he has never smoked. He has never used smokeless tobacco. He reports current alcohol use of about 3.0 standard drinks of alcohol per  week. He reports that he does not use drugs.  No Known Allergies  No family history on file.  Prior to Admission medications   Medication Sig Start Date End Date Taking? Authorizing Provider  acetaminophen (TYLENOL) 500 MG tablet Take 1,000 mg by mouth every 6 (six) hours as needed for moderate pain.    [provider]  Apoaequorin (PREVAGEN PO) Take 1 capsule by mouth daily.    [provider]  brimonidine (ALPHAGAN P) 0.1 % SOLN Place 1 drop into  both eyes in the morning and at bedtime.    [provider]  CALCIUM PO Take 600 mg by mouth daily.    [provider]  Camphor-Menthol-Methyl Sal (SALONPAS) 3.01-07-08 % PTCH Place 1 patch onto the skin daily as needed (pain).    [provider]  carboxymethylcellulose (REFRESH PLUS) 0.5 % SOLN Place 1 drop into both eyes daily as needed (dry eyes).    [provider]  celecoxib (CELEBREX) 200 MG capsule Take 1 capsule (200 mg total) by mouth 2 (two) times daily. 12/05/21   Fausto Skillern, PA-C  Cholecalciferol (VITAMIN D3 PO) Take 1 tablet by mouth daily.    [provider]  diclofenac Sodium (VOLTAREN) 1 % GEL Apply 1 application topically 4 (four) times daily as needed (pain).    [provider]  enoxaparin (LOVENOX) 40 MG/0.4ML injection Inject 0.4 mLs (40 mg total) into the skin daily for 14 days. 12/05/21 12/19/21  Fausto Skillern, PA-C  hydrochlorothiazide (HYDRODIURIL) 25 MG tablet Take 25 mg by mouth daily. Takes 1/2 tab am    [provider]  losartan (COZAAR) 25 MG tablet Take 25 mg by mouth at bedtime. Takes 2 tablets = 50 mg    [provider]  Melatonin 10 MG CAPS Take 10 mg by mouth at bedtime as needed (sleep).    [provider]  Multiple Vitamins-Minerals (PRESERVISION AREDS 2) CAPS Take 1 capsule by mouth daily.    [provider]  oxyCODONE (OXY IR/ROXICODONE) 5 MG immediate release tablet Take 1 tablet (5 mg total) by mouth every 4 (four) hours as needed for severe pain. 12/05/21   Fausto Skillern, PA-C  RIBOFLAVIN PO Take 1 tablet by mouth daily.    [provider]  tamsulosin (FLOMAX) 0.4 MG CAPS capsule Take 0.8 mg by mouth at bedtime.    [provider]  traMADol (ULTRAM) 50 MG tablet Take 1 tablet (50 mg total) by mouth every 4 (four) hours as needed for moderate pain. 01/31/21   Fausto Skillern, PA-C  traMADol (ULTRAM) 50 MG tablet Take 1 tablet (50 mg total) by  mouth every 4 (four) hours as needed for moderate pain. 12/05/21   Fausto Skillern, PA-C  traZODone (DESYREL) 50 MG tablet Take 50 mg by mouth at bedtime as needed for sleep.    [provider]  vitamin B-12 (CYANOCOBALAMIN) 100 MCG tablet Take 200 mcg by mouth daily.    [provider]  Zoledronic Acid (RECLAST IV) Inject into the vein. Once per year    [provider]    Physical Exam: Vitals:   01/03/22 1729  BP: 135/60  Pulse: 83  Resp: 16  Temp: 97.6 F (36.4 C)  TempSrc: Oral  SpO2: 100%  Weight: 60.8 kg  Height: _0  (1.626 m)   Physical Exam Vitals and nursing note reviewed.  Constitutional:      General: He is not in acute distress. HENT:  Head: Normocephalic and atraumatic.  Cardiovascular:     Rate and Rhythm: Normal rate and regular rhythm.     Heart sounds: Normal heart sounds.  Pulmonary:     Effort: Pulmonary effort is normal.     Breath sounds: Normal breath sounds.  Abdominal:     Palpations: Abdomen is soft.     Tenderness: There is no abdominal tenderness.  Neurological:     Mental Status: Mental status is at baseline.     Labs on Admission: I have personally reviewed following labs and imaging studies  CBC: Recent Labs  Lab 01/03/22 1732  WBC 5.9  HGB 5.2*  HCT 17.7*  MCV 79.4*  PLT 664   Basic Metabolic Panel: Recent Labs  Lab 01/03/22 1732  NA 136  K 3.9  CL 104  CO2 23  GLUCOSE 114*  BUN 31*  CREATININE 0.89  CALCIUM 8.7*   GFR: Estimated Creatinine Clearance: 56.4 mL/min (by C-G formula based on SCr of 0.89 mg/dL). Liver Function Tests: Recent Labs  Lab 01/03/22 1732  AST 16  ALT 6  ALKPHOS 50  BILITOT 0.8  PROT 6.3*  ALBUMIN 3.8   No results for input(s): "LIPASE", "AMYLASE" in the last 168 hours. No results for input(s): "AMMONIA" in the last 168 hours. Coagulation Profile: No results for input(s): "INR", "PROTIME" in the last 168 hours. Cardiac Enzymes: No results for input(s):  "CKTOTAL", "CKMB", "CKMBINDEX", "TROPONINI" in the last 168 hours. BNP (last 3 results) No results for input(s): "PROBNP" in the last 8760 hours. HbA1C: No results for input(s): "HGBA1C" in the last 72 hours. CBG: No results for input(s): "GLUCAP" in the last 168 hours. Lipid Profile: No results for input(s): "CHOL", "HDL", "LDLCALC", "TRIG", "CHOLHDL", "LDLDIRECT" in the last 72 hours. Thyroid Function Tests: No results for input(s): "TSH", "T4TOTAL", "FREET4", "T3FREE", "THYROIDAB" in the last 72 hours. Anemia Panel: No results for input(s): "VITAMINB12", "FOLATE", "FERRITIN", "TIBC", "IRON", "RETICCTPCT" in the last 72 hours. Urine analysis:    Component Value Date/Time   COLORURINE AMBER (A) 11/21/2021 1012   APPEARANCEUR HAZY (A) 11/21/2021 1012   LABSPEC 1.041 (H) 11/21/2021 1012   PHURINE 5.0 11/21/2021 1012   GLUCOSEU NEGATIVE 11/21/2021 1012   HGBUR NEGATIVE 11/21/2021 1012   BILIRUBINUR NEGATIVE 11/21/2021 1012   KETONESUR 5 (A) 11/21/2021 1012   PROTEINUR 30 (A) 11/21/2021 1012   NITRITE NEGATIVE 11/21/2021 1012   LEUKOCYTESUR NEGATIVE 11/21/2021 1012    Radiological Exams on Admission: DG Chest Port 1 View  Result Date: 01/03/2022 CLINICAL DATA:  Shortness of breath EXAM: PORTABLE CHEST 1 VIEW COMPARISON:  None Available. FINDINGS: Transverse diameter of heart is increased. There is moderate sized fixed hiatal hernia. Lung fields are clear of any infiltrates or pulmonary edema. There is no pleural effusion or pneumothorax. Degenerative changes are noted in cervical spine. Degenerative changes are noted in both shoulders, more so in the left shoulder. IMPRESSION: There are no signs of pulmonary edema or focal pulmonary consolidation. Moderate sized fixed hiatal hernia. Electronically Signed   By: Elmer Picker M.D.   On: 01/03/2022 20:30     Data Reviewed: Relevant notes from primary care and specialist visits, past discharge summaries as available in EHR,  including Care Everywhere. Prior diagnostic testing as pertinent to current admission diagnoses Updated medications and problem lists for reconciliation ED course, including vitals, labs, imaging, treatment and response to treatment Triage notes, nursing and pharmacy notes and ED provider's notes Notable results as noted in HPI   Assessment  and Plan: * Symptomatic anemia Suspecting acute on chronic GI blood loss anemia S/p total left hip arthroplasty 12/5 NSAID and recent anticoagulant use History of hematochromatosis treated with phlebotomy Hemoglobin 5.2, down from 9.7 a month ago. Hemoglobin has been downtrending from 2021-2023 from baseline of 15-->11.7-->9.7  a month ago .   No recent phlebotomy treatments for hematochromatosis Patient reports dark stool but stool guaiac negative.Was on prophylactic Lovenox from 12/5 to 12/20.  Currently on Cox 2 inhibitors DC Celebrex IV Protonix GI consult to evaluate for possibility of GI blood loss Follow anemia panel Continue with transfusion of 2 units PRBCs started in the ED Serial H&H N.p.o. for possible GI procedure in the a.m.   Positive D dimer Follow lower extremity Doppler--negative  Chronic pain syndrome Degenerative disc disease Avoid NSAIDs and Cox 2's Oxycodone as needed  Hereditary hemochromatosis (HCC) No recent phlebotomy treatments   Essential hypertension BP stable.  Continue home HCTZ and losartan        DVT prophylaxis: SCDs  Consults: GI, Dr. Alice Reichert  Advance Care Planning:   Code Status: DNR   Family Communication: Wife at bedside  Disposition Plan: Back to previous home environment  Severity of Illness: The appropriate patient status for this patient is OBSERVATION. Observation status is judged to be reasonable and necessary in order to provide the required intensity of service to ensure the patient's safety. The patient's presenting symptoms, physical exam findings, and initial radiographic and  laboratory data in the context of their medical condition is felt to place them at decreased risk for further clinical deterioration. Furthermore, it is anticipated that the patient will be medically stable for discharge from the hospital within 2 midnights of admission.   Author: Athena Masse, MD 01/03/2022 9:28 PM  For on call review [email or web address].

## 2022-01-03 NOTE — Assessment & Plan Note (Addendum)
Suspecting chronic blood loss anemia S/p total left hip arthroplasty 12/5 NSAID and recent anticoagulant use History of hematochromatosis treated with phlebotomy Hemoglobin 5.2, down from 9.7 a month ago. Hemoglobin has been downtrending from 2021-2023 from baseline of 15-->11.7-->9.7  a month ago .   No recent phlebotomy treatments for hematochromatosis Patient reports dark stool but stool guaiac negative.Was on prophylactic Lovenox from 12/5 to 12/20.  Currently on Cox 2 inhibitors DC Celebrex IV Protonix GI consult to evaluate for possibility of GI blood loss Follow anemia panel Continue with transfusion of 2 units PRBCs started in the ED Serial H&H N.p.o. for possible GI procedure in the a.m.

## 2022-01-03 NOTE — Assessment & Plan Note (Signed)
BP stable.  Continue home HCTZ and losartan

## 2022-01-03 NOTE — ED Notes (Signed)
First Nurse Note: Pt to ED via POV. Pt states that he was sent by his PCP for

## 2022-01-03 NOTE — Assessment & Plan Note (Signed)
Degenerative disc disease Avoid NSAIDs and Cox 2's Oxycodone as needed

## 2022-01-03 NOTE — Assessment & Plan Note (Addendum)
No recent phlebotomy treatments

## 2022-01-03 NOTE — ED Triage Notes (Signed)
C?O fatigue and DOE since New Years Day. Seen at [facility] today and had blood work drawn. Per family, low H/H. -- unable to see results in Epic.  Surgery done on 12/4, hip replacement.  AAOx3.  Skin warm and dry.  Pale.

## 2022-01-03 NOTE — Assessment & Plan Note (Signed)
Follow lower extremity Doppler--negative

## 2022-01-03 NOTE — ED Provider Notes (Incomplete)
[facility] Provider Note    Event Date/Time   First MD Initiated Contact with Patient 01/03/22 1940     (approximate)   History   abnormal labs   HPI  Carlos Neal. is a 82 y.o. male past medical history of hemochromatosis, osteoarthritis status post left total hip arthroplasty on 12/4 presents with exertional dyspnea.  Patient had hip replacement on 12/4.  Was recovering well and overall doing good until Monday when he woke up with exertional dyspnea.  Now feels very short of breath even with walking short distances.  He denies chest pain.  Denies cough lower extremity swelling.  Has had dark but he says black stool over the last 3 to 4 days.  Denies history of GI bleeding.  Tells me he has a history of hemochromatosis and has required phlebotomy in the past has never had a blood transfusion.  Has been told by his doctor that the ferritin levels have been low recently so he has not had to get phlebotomized in quite some time.   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/30/2021   B12 deficiency 03/16/2020   BPH associated with nocturia 03/16/2020   Chronic pain syndrome 12/23/2019   Pharmacologic therapy 12/23/2019   Disorder of skeletal system 12/23/2019   Problems influencing health status 95/09/3265   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[place]) 12/23/2019   Chronic lower extremity pain (1ry area of Pain) (Right) 12/23/2019   Chronic low back pain (2ry area of Pain)  (Bilateral) (R>L) w/ sciatica (Right) 12/23/2019   Abnormal MRI, lumbar spine (12/04/2019) 12/23/2019   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) 12/23/2019   Retrolisthesis (T12-L1, L1-2) 12/23/2019   Lumbosacral foraminal stenosis 12/23/2019   Lumbar facet arthropathy 12/23/2019   Osteoarthritis of facet joint of lumbar spine 12/23/2019   Lumbar facet syndrome (Bilateral) 12/23/2019   Wedge compression fracture of T12 vertebra, sequela 12/23/2019   DDD (degenerative disc disease), lumbosacral 12/23/2019   DDD (degenerative disc disease), cervical 12/23/2019   Cervical facet syndrome (Right) 12/23/2019   Cervicalgia 12/23/2019   Chronic neck pain (Right) 12/23/2019   Essential hypertension 12/20/2019   GERD without esophagitis 12/20/2019   Hereditary hemochromatosis ([place]) 12/20/2019   Impotence 12/20/2019   Insomnia 12/20/2019   Osteopenia 12/20/2019   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      Excl. in [place]?     Most recent vital signs: Vitals:   01/03/22 2315 01/03/22 2330  BP: 117/60 (!) 123/57  Pulse:    Resp: 15 14  Temp:    SpO2:       General: Awake, no distress.  CV:  Good peripheral perfusion.  Resp:  Normal effort.  No increased work of breathing Abd:  No distention.  Neuro:             Awake, Alert, Oriented x 3  Other:  No peripheral  edema Brown stool on rectal exam that is guaiac negative   ED Results / Procedures / Treatments  Labs (all labs ordered are listed, but only abnormal results are displayed) Labs Reviewed  COMPREHENSIVE METABOLIC PANEL - Abnormal; Notable for the following components:      Result Value   Glucose, Bld 114 (*)    BUN 31 (*)    Calcium 8.7 (*)    Total Protein 6.3 (*)    All other components within normal limits  CBC - Abnormal; Notable for the following components:   RBC 2.23 (*)    Hemoglobin 5.2 (*)    HCT 17.7 (*)    MCV 79.4 (*)    MCH 23.3 (*)    MCHC 29.4 (*)    RDW 18.0 (*)    All other components within normal limits  RETICULOCYTES - Abnormal; Notable for the following components:   Retic Ct Pct 4.3 (*)    RBC. 2.12 (*)    Immature Retic Fract 30.0 (*)    All other components within normal limits  FERRITIN - Abnormal; Notable for the following components:   Ferritin 6 (*)    All other components within normal limits  IRON AND TIBC - Abnormal; Notable for the following components:   Iron 20 (*)    Saturation Ratios 6 (*)    All other components within normal limits  POC OCCULT BLOOD, ED  TYPE AND SCREEN  PREPARE RBC (CROSSMATCH)     EKG     RADIOLOGY I reviewed and interpreted the CXR which does not show any acute cardiopulmonary process    PROCEDURES:  Critical Care performed: Yes, see critical care procedure note(s)  .Critical Care  Performed by: Rada Hay, MD Authorized by: Rada Hay, MD   Critical care provider statement:    Critical care time (minutes):  30   Critical care was time spent personally by me on the following activities:  Development of treatment plan with patient or surrogate, discussions with consultants, evaluation of patient's response to treatment, examination of patient, ordering and review of laboratory studies, ordering and review of radiographic studies, ordering and performing treatments and interventions,  pulse oximetry, re-evaluation of patient's condition and review of old charts   The patient is on the cardiac monitor to evaluate for evidence of arrhythmia and/or significant heart rate changes.   MEDICATIONS ORDERED IN ED: Medications  pantoprazole (PROTONIX) injection 40 mg (40 mg Intravenous Given 01/03/22 2244)  oxyCODONE (Oxy IR/ROXICODONE) immediate release tablet 5 mg (has no administration in time range)  losartan (COZAAR) tablet 50 mg (50 mg Oral Given 01/03/22 2244)  hydrochlorothiazide (HYDRODIURIL) tablet 12.5 mg (has no administration in time range)  traZODone (DESYREL) tablet 50 mg (has no administration in time range)  tamsulosin (FLOMAX) capsule 0.8 mg (0.8 mg Oral Given 01/03/22 2244)  acetaminophen (TYLENOL) tablet 650 mg (has no administration in time range)    Or  acetaminophen (TYLENOL) suppository 650 mg (has no administration in time range)  ondansetron (ZOFRAN) tablet 4 mg (has no administration in time range)    Or  ondansetron (ZOFRAN) injection 4 mg (has no administration in time range)  morphine (PF) 2 MG/ML injection 2 mg (has no administration in time range)  0.9 %  sodium chloride infusion (10 mL/hr Intravenous New Bag/Given 01/03/22 2245)     IMPRESSION / MDM / ASSESSMENT AND PLAN / ED COURSE  I reviewed the triage vital signs and the nursing notes.                              Patient's presentation is most consistent with acute presentation with potential threat to life or bodily function.  Differential diagnosis includes, but is not limited to, symptomatic anemia secondary to blood loss from surgery, GI bleed, malignancy, bone marrow failure, hemolytic anemia  Patient is 82 year old male with recent hip replacement on 12/4 presents because of anemia on outpatient labs.  He has had exertional dyspnea since Monday, 3 days and was found to have hemoglobin in the fives as an outpatient.  He says he had black stool is denying chest pain.  Satting 100% on room  air here.  On exam he looks well but pale.  He has brown stool on rectal exam is Hemoccult negative.  Does have a history of hemochromatosis interestingly and is required phlebotomy in the past but not recently.  Last hemoglobin in our system was 9.7.  Unclear cause of his anemia, MCV just slightly low at 79.  No historical features to suggest significant blood loss currently.  Will send reticulocytes iron and ferritin and transfuse 2 units.  Of note a D-dimer was sent from outpatient setting which is elevated 2.11.  I have a low suspicion for PE right now we have another explanation for his exertional dyspnea.  Can send bilateral lower extremity Dopplers but I would defer CTA at this point.   Discussed with Dr. Damita Dunnings who will admit the patient.       FINAL CLINICAL IMPRESSION(S) / ED DIAGNOSES   Final diagnoses:  Anemia, unspecified type     Rx / DC Orders   ED Discharge Orders     None        Note:  This document was prepared using Dragon voice recognition software and may include unintentional dictation errors.   Rada Hay, MD 01/04/22 6440    Rada Hay, MD 01/04/22 [phone]

## 2022-01-04 ENCOUNTER — Encounter: Payer: Self-pay | Admitting: Internal Medicine

## 2022-01-04 DIAGNOSIS — D649 Anemia, unspecified: Secondary | ICD-10-CM | POA: Diagnosis not present

## 2022-01-04 LAB — HEMOGLOBIN AND HEMATOCRIT, BLOOD
HCT: 21.6 % — ABNORMAL LOW (ref 39.0–52.0)
HCT: 22.8 % — ABNORMAL LOW (ref 39.0–52.0)
HCT: 24.9 % — ABNORMAL LOW (ref 39.0–52.0)
Hemoglobin: 7 g/dL — ABNORMAL LOW (ref 13.0–17.0)
Hemoglobin: 7.5 g/dL — ABNORMAL LOW (ref 13.0–17.0)
Hemoglobin: 7.9 g/dL — ABNORMAL LOW (ref 13.0–17.0)

## 2022-01-04 LAB — PREPARE RBC (CROSSMATCH)

## 2022-01-04 MED ORDER — SODIUM CHLORIDE 0.9 % IV SOLN
INTRAVENOUS | Status: DC
  Administered 2022-01-04: 999 mL via INTRAVENOUS

## 2022-01-04 MED ORDER — POLYETHYLENE GLYCOL 3350 17 GM/SCOOP PO POWD
1.0000 | Freq: Once | ORAL | Status: AC
  Administered 2022-01-04: 255 g via ORAL
  Filled 2022-01-04: qty 255

## 2022-01-04 NOTE — ED Notes (Signed)
Pt ambulated to bathroom with cane.  Ambulated back to stretcher with NAD.

## 2022-01-04 NOTE — ED Notes (Signed)
Notified NP of H&H results after 2 units of blood.

## 2022-01-04 NOTE — IPAL (Addendum)
  Interdisciplinary Goals of Care Family Meeting   Date carried out: 01/04/2022  Location of the meeting: Bedside  Member's involved: Physician, Bedside Registered Nurse, and Family Member or next of kin, wife  Durable Power of Attorney or Loss adjuster, chartered: patient    Discussion: We discussed goals of care for [employer]. .  I have reviewed medical records including EPIC notes, labs and imaging, assessed the patient and then met with patient and wife to discuss major active diagnoses, plan of care, natural trajectory, prognosis, GOC, EOL wishes, disposition and options. He is in agreement to continue current plan of care .  Questions and concerns were addressed.  He elects to be DNR.    Code status:   Code Status: DNR   Disposition: Continue current acute care  Time spent for the meeting: [place], MD  01/04/2022, 12:32 AM

## 2022-01-04 NOTE — Progress Notes (Signed)
PROGRESS NOTE    Carlos Neal.  ZOX:[id number] DOB: 1940-03-11 DOA: 01/03/2022 PCP: Dion Body, MD   Brief Narrative:  Carlos Neal. is a 82 y.o. 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 Hemoglobin has been downtrending from 2021-2023 from baseline of 15-->11.7-->9.7  a month ago .   No recent phlebotomy treatments for hematochromatosis Patient reports dark stool but stool guaiac negative.Was on prophylactic Lovenox from 12/5 to 12/20.  Currently on Cox 2 inhibitors DC Celebrex IV Protonix GI consult to evaluate for possibility of GI blood loss Follow anemia panel Now s/p transfusion of 2 units PRBCs started in the ED Serial H&H N.p.o. for possible GI procedure later today.  Positive D dimer Follow lower extremity Doppler--negative  Chronic pain syndrome Degenerative disc disease Avoid NSAIDs and Cox 2's Oxycodone as needed  Hereditary  hemochromatosis (HCC) No recent phlebotomy treatments  Essential hypertension BP stable.  Continue home HCTZ and losartan  DVT prophylaxis: SCDs Code Status: FULL Family Communication:  Discussed with patient and wife in ER this AM.  Status is: Observation   Body mass index is 23.01 kg/m.   Subjective: Seen resting in ER, no complaints. Feels a little better after blood overnight.  Examination:  General exam: Appears calm and comfortable, slightly pale. Respiratory system: Clear to auscultation. Respiratory effort normal. Cardiovascular system: S1 & S2 heard, RRR. No JVD, murmurs, rubs, gallops or clicks. No pedal edema. Gastrointestinal system: Abdomen is nondistended, soft and nontender. No organomegaly or masses felt. Normal bowel sounds heard. Central nervous system: Alert and oriented. No focal neurological deficits. Extremities: Symmetric 5 x 5 power. Skin: No rashes, lesions or ulcers Psychiatry: Judgement and insight appear normal. Mood & affect appropriate.     Objective: Vitals:   01/04/22 0430 01/04/22 0530 01/04/22 0545 01/04/22 0600  BP: 132/65 126/71 123/64 117/65  Pulse: 80 79 78 76  Resp:    17  Temp:      TempSrc:      SpO2: 99% 98% 99% 99%  Weight:      Height:        Intake/Output Summary (Last 24 hours) at 01/04/2022 0750 Last data filed at 01/04/2022 0622 Gross per 24 hour  Intake 1690 ml  Output --  Net 1690 ml   Filed Weights   01/03/22 1729  Weight: 60.8 kg     Data Reviewed:   CBC: Recent Labs  Lab 01/03/22 1732 01/04/22 0410  WBC 5.9  --   HGB 5.2* 7.0*  HCT  17.7* 21.6*  MCV 79.4*  --   PLT 370  --    Basic Metabolic Panel: Recent Labs  Lab 01/03/22 1732  NA 136  K 3.9  CL 104  CO2 23  GLUCOSE 114*  BUN 31*  CREATININE 0.89  CALCIUM 8.7*   GFR: Estimated Creatinine Clearance: 56.4 mL/min (by C-G formula based on SCr of 0.89 mg/dL). Liver Function Tests: Recent Labs  Lab 01/03/22 1732  AST 16  ALT 6   ALKPHOS 50  BILITOT 0.8  PROT 6.3*  ALBUMIN 3.8   No results for input(s): "LIPASE", "AMYLASE" in the last 168 hours. No results for input(s): "AMMONIA" in the last 168 hours. Coagulation Profile: No results for input(s): "INR", "PROTIME" in the last 168 hours. Cardiac Enzymes: No results for input(s): "CKTOTAL", "CKMB", "CKMBINDEX", "TROPONINI" in the last 168 hours. BNP (last 3 results) No results for input(s): "PROBNP" in the last 8760 hours. HbA1C: No results for input(s): "HGBA1C" in the last 72 hours. CBG: No results for input(s): "GLUCAP" in the last 168 hours. Lipid Profile: No results for input(s): "CHOL", "HDL", "LDLCALC", "TRIG", "CHOLHDL", "LDLDIRECT" in the last 72 hours. Thyroid Function Tests: No results for input(s): "TSH", "T4TOTAL", "FREET4", "T3FREE", "THYROIDAB" in the last 72 hours. Anemia Panel: Recent Labs    01/03/22 2115  FERRITIN 6*  TIBC 328  IRON 20*  RETICCTPCT 4.3*   Sepsis Labs: No results for input(s): "PROCALCITON", "LATICACIDVEN" in the last 168 hours.  No results found for this or any previous visit (from the past 240 hour(s)).   Radiology Studies: US Venous Img Lower Bilateral (DVT)  Result Date: 01/03/2022 CLINICAL DATA:  Elevated D-dimer. EXAM: BILATERAL LOWER EXTREMITY VENOUS DOPPLER ULTRASOUND TECHNIQUE: Gray-scale sonography with graded compression, as well as color Doppler and duplex ultrasound were performed to evaluate the lower extremity deep venous systems from the level of the common femoral vein and including the common femoral, femoral, profunda femoral, popliteal and calf veins including the posterior tibial, peroneal and gastrocnemius veins when visible. The superficial great saphenous vein was also interrogated. Spectral Doppler was utilized to evaluate flow at rest and with distal augmentation maneuvers in the common femoral, femoral and popliteal veins. COMPARISON:  None Available. FINDINGS: RIGHT LOWER EXTREMITY Common  Femoral Vein: No evidence of thrombus. Normal compressibility, respiratory phasicity and response to augmentation. Saphenofemoral Junction: No evidence of thrombus. Normal compressibility and flow on color Doppler imaging. Profunda Femoral Vein: No evidence of thrombus. Normal compressibility and flow on color Doppler imaging. Femoral Vein: No evidence of thrombus. Normal compressibility, respiratory phasicity and response to augmentation. Popliteal Vein: No evidence of thrombus. Normal compressibility, respiratory phasicity and response to augmentation. Calf Veins: No evidence of thrombus. Normal compressibility and flow on color Doppler imaging. Superficial Great Saphenous Vein: No evidence of thrombus. Normal compressibility. Venous Reflux:  None. Other Findings:  None. LEFT LOWER EXTREMITY Common Femoral Vein: No evidence of thrombus. Normal compressibility, respiratory phasicity and response to augmentation. Saphenofemoral Junction: No evidence of thrombus. Normal compressibility and flow on color Doppler imaging. Profunda Femoral Vein: No evidence of thrombus. Normal compressibility and flow on color Doppler imaging. Femoral Vein: No evidence of thrombus. Normal compressibility, respiratory phasicity and response to augmentation. Popliteal Vein: No evidence of thrombus. Normal compressibility, respiratory phasicity and response to augmentation. Calf Veins: No evidence of thrombus. Normal compressibility and flow on color Doppler imaging. Superficial Great Saphenous Vein: No evidence of thrombus. Normal compressibility. Venous Reflux:  None. Other Findings:  None. IMPRESSION: No evidence of  deep venous thrombosis in either lower extremity. Electronically Signed   By: Aram Candela M.D.   On: 01/03/2022 22:46   DG Chest Port 1 View  Result Date: 01/03/2022 CLINICAL DATA:  Shortness of breath EXAM: PORTABLE CHEST 1 VIEW COMPARISON:  None Available. FINDINGS: Transverse diameter of heart is increased. There  is moderate sized fixed hiatal hernia. Lung fields are clear of any infiltrates or pulmonary edema. There is no pleural effusion or pneumothorax. Degenerative changes are noted in cervical spine. Degenerative changes are noted in both shoulders, more so in the left shoulder. IMPRESSION: There are no signs of pulmonary edema or focal pulmonary consolidation. Moderate sized fixed hiatal hernia. Electronically Signed   By: Ernie Avena M.D.   On: 01/03/2022 20:30        Scheduled Meds:  hydrochlorothiazide  12.5 mg Oral Daily   losartan  50 mg Oral QHS   pantoprazole (PROTONIX) IV  40 mg Intravenous QHS   tamsulosin  0.8 mg Oral QHS   Continuous Infusions:   LOS: 0 days   Time spent= 25 mins    Brendaly Townsel Vergie Living, MD [facility]  If 7PM-7AM, please contact night-coverage  01/04/2022, 7:50 AM

## 2022-01-04 NOTE — H&P (View-Only) (Signed)
  GI Inpatient Consult Note  Reason for Consult: Symptomatic anemia    Attending Requesting Consult: Dr. Hazel Duncan   History of Present Illness: Carlos R Allbaugh Jr. is a 82 y.o. male seen for evaluation of symptomatic anemia at the request of admitting hospitalist - Dr. Hazel Duncan. Patient has a PMH of HTN, osteoporosis, chronic pain syndrome, DDD, hereditary hemochromatosis, and s/p left total hip arthroplasty 12/4 last month. He presented to the [facility] ED yesterday afternoon for concerns of low hemoglobin (5.2) and symptomatic anemia. Upon presentation to the ED, all vital signs were stable. Labs were significant for hemoglobin 5.2, MCV 79, BUN 31, serum creatinine 0.89, retic 4.3%, ferritin 6, serum iron 20, and iron sat 6%. Per ED physician, he had brown stool on rectal exam which was guaiac negative. Hemoglobin 1 month ago prior to surgery was 9.7. chest x-ray showed no signs of acute cardiopulmonary disease. He received 2 units pRBCs overnight and GI consulted for further evaluation.   Patient seen and examined this morning resting comfortably. No acute events overnight. Hemoglobin 7.0 after transfusion of 2 units pRBCs. He denies any significant changes in his bowel habits. He denies any overt gastrointestinal bleeding. He reports his total hip replacement last month was uneventful with no post-procedure complications. He has been taking Celebrex for pain since the procedure and no other frequent NSAIDs. He took 2-weeks of Lovenox injections post procedure which was completed on 12/20. He reports symptoms started on Christmas Eve when he started to experience fatigue, weakness, and dyspnea on exertion. His symptoms did not get any better over the next week and he was scheduled to see his PCP yesterday for evaluation and told to come to the ED after getting labs. He reports he is feeling better after transfusion. He denies any nausea, vomiting, dysphagia, GERD, early satiety, hoarseness,  abdominal pain, diarrhea, constipation, hematochezia, or melena. He does endorse a 10-lb weight loss over the past 6-months. Appetite has been stable. He has not had any phlebotomies over the past few years for his HH. Last colonoscopy 01/2013 showed sigmoid diverticulosis and otherwise normal examined colon. No history of GI bleeding. He denies any bruising on his left hip or known hematomas.   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   B12 deficiency 03/16/2020   BPH associated with nocturia 03/16/2020   Chronic pain syndrome 12/23/2019   Pharmacologic therapy 12/23/2019   Disorder of skeletal system 12/23/2019   Problems influencing health status 12/23/2019   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HCC) 12/23/2019   Chronic lower extremity pain (1ry area of Pain) (Right) 12/23/2019   Chronic low back pain (2ry area of Pain) (Bilateral) (R>L) w/ sciatica (Right) 12/23/2019   Abnormal MRI, lumbar spine (12/04/2019) 12/23/2019   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) 12/23/2019   Retrolisthesis (T12-L1, L1-2) 12/23/2019   Lumbosacral foraminal stenosis 12/23/2019   Lumbar   facet arthropathy 12/23/2019   Osteoarthritis of facet joint of lumbar spine 12/23/2019   Lumbar facet syndrome (Bilateral) 12/23/2019   Wedge compression fracture of T12 vertebra, sequela 12/23/2019   DDD (degenerative disc disease), lumbosacral 12/23/2019   DDD (degenerative disc disease),  cervical 12/23/2019   Cervical facet syndrome (Right) 12/23/2019   Cervicalgia 12/23/2019   Chronic neck pain (Right) 12/23/2019   Essential hypertension 12/20/2019   GERD without esophagitis 12/20/2019   Hereditary hemochromatosis (HCC) 12/20/2019   Impotence 12/20/2019   Insomnia 12/20/2019   Osteopenia 12/20/2019   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/21/2019   Spondylosis of cervical region without myelopathy or radiculopathy 02/20/2019   Osteopenia of multiple sites 09/11/2017   Encounter for general adult medical examination without abnormal findings 12/01/2015   Vaccine counseling 12/01/2015    Past Surgical History: Past Surgical History:  Procedure Laterality Date   BROW LIFT Bilateral 09/23/2020   Procedure: BLEPHAROPTOSIS REPAIR; RESECT EX  BROW PTOSIS REPAIR ECTROPION REPAIR, EXTENSIVE BILATERAL;  Surgeon: Fowler, Amy M, MD;  Location: [facility];  Service: Ophthalmology;  Laterality: Bilateral;  wants to be later in the schedule   COLONOSCOPY  01/12/2013   HYDROCELE EXCISION     age 17   INGUINAL HERNIA REPAIR Bilateral    as a child   KNEE ARTHROPLASTY Right 01/30/2021   Procedure: COMPUTER ASSISTED TOTAL KNEE ARTHROPLASTY;  Surgeon: Hooten, James P, MD;  Location: [facility];  Service: Orthopedics;  Laterality: Right;   LUMBAR LAMINECTOMY/ DECOMPRESSION WITH MET-RX Right 01/15/2020   Procedure: L2-3 DECOMPRESSION, RIGHT L2-3 FAR LATERAL DISCECTOMY;  Surgeon: Yarbrough, Chester, MD;  Location: [facility];  Service: Neurosurgery;  Laterality: Right;  2nd case   TOTAL HIP ARTHROPLASTY Left 12/04/2021   Procedure: TOTAL HIP ARTHROPLASTY;  Surgeon: Hooten, James P, MD;  Location: [facility];  Service: Orthopedics;  Laterality: Left;    Allergies: No Known Allergies  Home Medications: (Not in a hospital admission)  Home medication reconciliation was completed with the patient.   Scheduled Inpatient Medications:    hydrochlorothiazide   12.5 mg Oral Daily   losartan  50 mg Oral QHS   pantoprazole (PROTONIX) IV  40 mg Intravenous QHS   tamsulosin  0.8 mg Oral QHS    Continuous Inpatient Infusions:    PRN Inpatient Medications:  acetaminophen **OR** acetaminophen, morphine injection, ondansetron **OR** ondansetron (ZOFRAN) IV, oxyCODONE, traZODone  Family History: family history is not on file.  The patient's family history is negative for inflammatory bowel disorders, GI malignancy, or solid organ transplantation.  Social History:   reports that he has never smoked. He has never used smokeless tobacco. He reports current alcohol use of about 3.0 standard drinks of alcohol per week. He reports that he does not use drugs. The patient denies ETOH, tobacco, or drug use.   Review of Systems: Constitutional: Weight is stable.  Eyes: No changes in vision. ENT: No oral lesions, sore throat.  GI: see HPI.  Heme/Lymph: No easy bruising.  CV: No chest pain.  GU: No hematuria.  Integumentary: No rashes.  Neuro: No headaches.  Psych: No depression/anxiety.  Endocrine: No heat/cold intolerance.  Allergic/Immunologic: No urticaria.  Resp: No cough, SOB.  Musculoskeletal: No joint swelling.    Physical Examination: BP (!) 143/118   Pulse 77   Temp 98.8 F (37.1 C) (Oral)   Resp 17   Ht 5' 4" (1.626 m)   Wt 60.8 kg   SpO2 100%   BMI 23.01 kg/m    Gen: NAD, alert and oriented x 4 HEENT: PEERLA, EOMI, Neck: supple, no JVD or thyromegaly Chest: CTA bilaterally, no wheezes, crackles, or other adventitious sounds CV: RRR, no m/g/c/r Abd: soft, NT, ND, +BS in all four quadrants; no HSM, guarding, ridigity, or rebound tenderness Ext: no edema, well perfused with 2+ pulses, Skin: no rash or lesions noted Lymph: no LAD  Data: Lab Results  Component Value Date   WBC 5.9 01/03/2022   HGB 7.0 (L) 01/04/2022   HCT 21.6 (L) 01/04/2022   MCV 79.4 (L) 01/03/2022   PLT 370 01/03/2022   Recent Labs  Lab 01/03/22 1732  01/04/22 0410  HGB 5.2* 7.0*   Lab Results  Component Value Date   NA 136 01/03/2022   K 3.9 01/03/2022   CL 104 01/03/2022   CO2 23 01/03/2022   BUN 31 (H) 01/03/2022   CREATININE 0.89 01/03/2022   Lab Results  Component Value Date   ALT 6 01/03/2022   AST 16 01/03/2022   ALKPHOS 50 01/03/2022   BILITOT 0.8 01/03/2022   No results for input(s): "APTT", "INR", "PTT" in the last 168 hours.  Assessment/Plan:  81 y/o Caucasian male with a PMH of HTN, DDD, hereditary hemochromatosis, and history of recent left total hip arthroplasty 12/4 last month presented to the [facility] ED yesterday afternoon from urgent care for concerns of low hemoglobin (5.2) and symptomatic anemia  Symptomatic anemia - drop in hemoglobin from 9.7 one month ago to 5.2 yesterday concerning for potential chronic GI blood loss. Hemoglobin improved to 7.0 s/p 2 units pRBCs transfusion. DDx includes peptic ulcer disease, gastritis +/- H pylori, AVMs, duodenitis, erosive esophagitis, polyps, neoplasm, right colon source, etc.   Iron-deficiency anemia - potential chronic GI blood loss/occult GI bleed  S/p recent left total hip arthroplasty 12/04/21  NSAID use + anticoagulation s/p recent surgery - Celebrex and Lovenox   Hereditary hemochromatosis   Sigmoid diverticulosis   Chronic pain syndrome   DNR status   Recommendations:  - Continue to monitor serial H&H. Transfuse for Hgb <7.0.  - No signs of overt gastrointestinal bleeding. Guaiac negative on DRE.  - Continue Protonix for gastric protection - Discussed potential clinical observation versus diagnostic work-up with EGD + colonoscopy to evaluate for chronic GI blood loss/sources of IDA. Patient and wife would like to proceed with definitive evaluation.  - EGD + colonoscopy tomorrow with Dr. Toledo - Avoid NSAIDs and all anticoagulation - Transfuse 1 additional unit pRBCs today +/- iron infusion. Defer to primary team.  - Clear liquid diet today. NPO  after midnight.  - See procedure note for findings and further recommendations  I reviewed the risks (including bleeding, perforation, infection, anesthesia complications, cardiac/respiratory complications), benefits and alternatives of EGD and colonoscopy. Patient consents to proceed.   Thank you for the consult. Please call with questions or concerns.  Mylie Mccurley M Taneah Masri, PA-C [facility] [phone]  

## 2022-01-04 NOTE — Consult Note (Signed)
GI Inpatient Consult Note  Reason for Consult: Symptomatic anemia    Attending Requesting Consult: Dr. Judd Gaudier   History of Present Illness: Carlos Neal. is a 82 y.o. male seen for evaluation of symptomatic anemia at the request of admitting hospitalist - Dr. Judd Gaudier. Patient has a PMH of HTN, osteoporosis, chronic pain syndrome, DDD, hereditary hemochromatosis, and s/p left total hip arthroplasty 12/4 last month. He presented to the [facility] ED yesterday afternoon for concerns of low hemoglobin (5.2) and symptomatic anemia. Upon presentation to the ED, all vital signs were stable. Labs were significant for hemoglobin 5.2, MCV 79, BUN 31, serum creatinine 0.89, retic 4.3%, ferritin 6, serum iron 20, and iron sat 6%. Per ED physician, he had brown stool on rectal exam which was guaiac negative. Hemoglobin 1 month ago prior to surgery was 9.7. chest x-ray showed no signs of acute cardiopulmonary disease. He received 2 units pRBCs overnight and GI consulted for further evaluation.   Patient seen and examined this morning resting comfortably. No acute events overnight. Hemoglobin 7.0 after transfusion of 2 units pRBCs. He denies any significant changes in his bowel habits. He denies any overt gastrointestinal bleeding. He reports his total hip replacement last month was uneventful with no post-procedure complications. He has been taking Celebrex for pain since the procedure and no other frequent NSAIDs. He took 2-weeks of Lovenox injections post procedure which was completed on 12/20. He reports symptoms started on Christmas Eve when he started to experience fatigue, weakness, and dyspnea on exertion. His symptoms did not get any better over the next week and he was scheduled to see his PCP yesterday for evaluation and told to come to the ED after getting labs. He reports he is feeling better after transfusion. He denies any nausea, vomiting, dysphagia, GERD, early satiety, hoarseness,  abdominal pain, diarrhea, constipation, hematochezia, or melena. He does endorse a 10-lb weight loss over the past 32-month. Appetite has been stable. He has not had any phlebotomies over the past few years for his HH. Last colonoscopy 01/2013 showed sigmoid diverticulosis and otherwise normal examined colon. No history of GI bleeding. He denies any bruising on his left hip or known hematomas.   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   B12 deficiency 03/16/2020   BPH associated with nocturia 03/16/2020   Chronic pain syndrome 12/23/2019   Pharmacologic therapy 12/23/2019   Disorder of skeletal system 12/23/2019   Problems influencing health status 100/76/2263  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[place] 12/23/2019   Chronic lower extremity pain (1ry area of Pain) (Right) 12/23/2019   Chronic low back pain (2ry area of Pain) (Bilateral) (R>L) w/ sciatica (Right) 12/23/2019   Abnormal MRI, lumbar spine (12/04/2019) 12/23/2019   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) 12/23/2019   Retrolisthesis (T12-L1, L1-2) 12/23/2019   Lumbosacral foraminal stenosis 12/23/2019   Lumbar  facet arthropathy 12/23/2019   Osteoarthritis of facet joint of lumbar spine 12/23/2019   Lumbar facet syndrome (Bilateral) 12/23/2019   Wedge compression fracture of T12 vertebra, sequela 12/23/2019   DDD (degenerative disc disease), lumbosacral 12/23/2019   DDD (degenerative disc disease),  cervical 12/23/2019   Cervical facet syndrome (Right) 12/23/2019   Cervicalgia 12/23/2019   Chronic neck pain (Right) 12/23/2019   Essential hypertension 12/20/2019   GERD without esophagitis 12/20/2019   Hereditary hemochromatosis ([place]) 12/20/2019   Impotence 12/20/2019   Insomnia 12/20/2019   Osteopenia 12/20/2019   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/21/2019   Spondylosis of cervical region without myelopathy or radiculopathy 02/20/2019   Osteopenia of multiple sites 09/11/2017   Encounter for general adult medical examination without abnormal findings 12/01/2015   Vaccine counseling 12/01/2015    Past Surgical History: Past Surgical History:  Procedure Laterality Date   BROW LIFT Bilateral 09/23/2020   Procedure: BLEPHAROPTOSIS REPAIR; RESECT EX  BROW PTOSIS REPAIR ECTROPION REPAIR, EXTENSIVE BILATERAL;  Surgeon: Karle Starch, MD;  Location: [place];  Service: Ophthalmology;  Laterality: Bilateral;  wants to be later in the schedule   COLONOSCOPY  01/12/2013   HYDROCELE EXCISION     age 68   INGUINAL HERNIA REPAIR Bilateral    as a child   KNEE ARTHROPLASTY Right 01/30/2021   Procedure: COMPUTER ASSISTED TOTAL KNEE ARTHROPLASTY;  Surgeon: Dereck Leep, MD;  Location: [facility];  Service: Orthopedics;  Laterality: Right;   LUMBAR LAMINECTOMY/ DECOMPRESSION WITH MET-RX Right 01/15/2020   Procedure: L2-3 DECOMPRESSION, RIGHT L2-3 FAR LATERAL DISCECTOMY;  Surgeon: Meade Maw, MD;  Location: [facility];  Service: Neurosurgery;  Laterality: Right;  2nd case   TOTAL HIP ARTHROPLASTY Left 12/04/2021   Procedure: TOTAL HIP ARTHROPLASTY;  Surgeon: Dereck Leep, MD;  Location: [facility];  Service: Orthopedics;  Laterality: Left;    Allergies: No Known Allergies  Home Medications: (Not in a hospital admission)  Home medication reconciliation was completed with the patient.   Scheduled Inpatient Medications:    hydrochlorothiazide   12.5 mg Oral Daily   losartan  50 mg Oral QHS   pantoprazole (PROTONIX) IV  40 mg Intravenous QHS   tamsulosin  0.8 mg Oral QHS    Continuous Inpatient Infusions:    PRN Inpatient Medications:  acetaminophen **OR** acetaminophen, morphine injection, ondansetron **OR** ondansetron (ZOFRAN) IV, oxyCODONE, traZODone  Family History: family history is not on file.  The patient's family history is negative for inflammatory bowel disorders, GI malignancy, or solid organ transplantation.  Social History:   reports that he has never smoked. He has never used smokeless tobacco. He reports current alcohol use of about 3.0 standard drinks of alcohol per week. He reports that he does not use drugs. The patient denies ETOH, tobacco, or drug use.   Review of Systems: Constitutional: Weight is stable.  Eyes: No changes in vision. ENT: No oral lesions, sore throat.  GI: see HPI.  Heme/Lymph: No easy bruising.  CV: No chest pain.  GU: No hematuria.  Integumentary: No rashes.  Neuro: No headaches.  Psych: No depression/anxiety.  Endocrine: No heat/cold intolerance.  Allergic/Immunologic: No urticaria.  Resp: No cough, SOB.  Musculoskeletal: No joint swelling.    Physical Examination: BP (!) 143/118   Pulse 77   Temp 98.8 F (37.1 C) (Oral)   Resp 17   Ht 5' 4" (1.626 m)   Wt 60.8 kg   SpO2 100%   BMI 23.01 kg/m  Gen: NAD, alert and oriented x 4 HEENT: PEERLA, EOMI, Neck: supple, no JVD or thyromegaly Chest: CTA bilaterally, no wheezes, crackles, or other adventitious sounds CV: RRR, no m/g/c/r Abd: soft, NT, ND, +BS in all four quadrants; no HSM, guarding, ridigity, or rebound tenderness Ext: no edema, well perfused with 2+ pulses, Skin: no rash or lesions noted Lymph: no LAD  Data: Lab Results  Component Value Date   WBC 5.9 01/03/2022   HGB 7.0 (L) 01/04/2022   HCT 21.6 (L) 01/04/2022   MCV 79.4 (L) 01/03/2022   PLT 370 01/03/2022   Recent Labs  Lab 01/03/22 1732  01/04/22 0410  HGB 5.2* 7.0*   Lab Results  Component Value Date   NA 136 01/03/2022   K 3.9 01/03/2022   CL 104 01/03/2022   CO2 23 01/03/2022   BUN 31 (H) 01/03/2022   CREATININE 0.89 01/03/2022   Lab Results  Component Value Date   ALT 6 01/03/2022   AST 16 01/03/2022   ALKPHOS 50 01/03/2022   BILITOT 0.8 01/03/2022   No results for input(s): "APTT", "INR", "PTT" in the last 168 hours.  Assessment/Plan:  82 y/o Caucasian male with a PMH of HTN, DDD, hereditary hemochromatosis, and history of recent left total hip arthroplasty 12/4 last month presented to the [facility] ED yesterday afternoon from urgent care for concerns of low hemoglobin (5.2) and symptomatic anemia  Symptomatic anemia - drop in hemoglobin from 9.7 one month ago to 5.2 yesterday concerning for potential chronic GI blood loss. Hemoglobin improved to 7.0 s/p 2 units pRBCs transfusion. DDx includes peptic ulcer disease, gastritis +/- H pylori, AVMs, duodenitis, erosive esophagitis, polyps, neoplasm, right colon source, etc.   Iron-deficiency anemia - potential chronic GI blood loss/occult GI bleed  S/p recent left total hip arthroplasty 12/04/21  NSAID use + anticoagulation s/p recent surgery - Celebrex and Lovenox   Hereditary hemochromatosis   Sigmoid diverticulosis   Chronic pain syndrome   DNR status   Recommendations:  - Continue to monitor serial H&H. Transfuse for Hgb <7.0.  - No signs of overt gastrointestinal bleeding. Guaiac negative on DRE.  - Continue Protonix for gastric protection - Discussed potential clinical observation versus diagnostic work-up with EGD + colonoscopy to evaluate for chronic GI blood loss/sources of IDA. Patient and wife would like to proceed with definitive evaluation.  - EGD + colonoscopy tomorrow with Dr. Alice Reichert - Avoid NSAIDs and all anticoagulation - Transfuse 1 additional unit pRBCs today +/- iron infusion. Defer to primary team.  - Clear liquid diet today. NPO  after midnight.  - See procedure note for findings and further recommendations  I reviewed the risks (including bleeding, perforation, infection, anesthesia complications, cardiac/respiratory complications), benefits and alternatives of EGD and colonoscopy. Patient consents to proceed.   Thank you for the consult. Please call with questions or concerns.  Geanie Kenning, PA-C [facility] [phone]

## 2022-01-05 ENCOUNTER — Encounter: Payer: Self-pay | Admitting: Internal Medicine

## 2022-01-05 ENCOUNTER — Encounter
Admission: EM | Disposition: A | Discharge status: Home / Self Care | Payer: Self-pay | Source: Home / Self Care | Attending: Emergency Medicine

## 2022-01-05 ENCOUNTER — Observation Stay: Payer: Medicare PPO | Admitting: Anesthesiology

## 2022-01-05 DIAGNOSIS — D649 Anemia, unspecified: Secondary | ICD-10-CM | POA: Diagnosis not present

## 2022-01-05 HISTORY — PX: ESOPHAGOGASTRODUODENOSCOPY (EGD) WITH PROPOFOL: SHX5813

## 2022-01-05 HISTORY — PX: GIVENS CAPSULE STUDY: SHX5432

## 2022-01-05 HISTORY — PX: COLONOSCOPY: SHX5424

## 2022-01-05 LAB — TYPE AND SCREEN
ABO/RH(D): B POS
Antibody Screen: NEGATIVE
Unit division: 0
Unit division: 0

## 2022-01-05 LAB — BASIC METABOLIC PANEL
Anion gap: 5 (ref 5–15)
BUN: 12 mg/dL (ref 8–23)
CO2: 21 mmol/L — ABNORMAL LOW (ref 22–32)
Calcium: 8 mg/dL — ABNORMAL LOW (ref 8.9–10.3)
Chloride: 107 mmol/L (ref 98–111)
Creatinine, Ser: 0.8 mg/dL (ref 0.61–1.24)
GFR, Estimated: >60 mL/min (ref >60)
Glucose, Bld: 99 mg/dL (ref 70–99)
Potassium: 3.6 mmol/L (ref 3.5–5.1)
Sodium: 133 mmol/L — ABNORMAL LOW (ref 135–145)

## 2022-01-05 LAB — BPAM RBC
Blood Product Expiration Date: 202401052359
Blood Product Expiration Date: 202401172359
ISSUE DATE / TIME: 202401032146
ISSUE DATE / TIME: 202401040043
Unit Type and Rh: 1700
Unit Type and Rh: 7300

## 2022-01-05 LAB — CBC
HCT: 22.8 % — ABNORMAL LOW (ref 39.0–52.0)
Hemoglobin: 7.3 g/dL — ABNORMAL LOW (ref 13.0–17.0)
MCH: 25.4 pg — ABNORMAL LOW (ref 26.0–34.0)
MCHC: 32 g/dL (ref 30.0–36.0)
MCV: 79.4 fL — ABNORMAL LOW (ref 80.0–100.0)
Platelets: 292 10*3/uL (ref 150–400)
RBC: 2.87 MIL/uL — ABNORMAL LOW (ref 4.22–5.81)
RDW: 16.4 % — ABNORMAL HIGH (ref 11.5–15.5)
WBC: 6.2 10*3/uL (ref 4.0–10.5)
nRBC: 0 % (ref 0.0–0.2)

## 2022-01-05 LAB — HEMOGLOBIN AND HEMATOCRIT, BLOOD
HCT: 23.3 % — ABNORMAL LOW (ref 39.0–52.0)
HCT: 24.9 % — ABNORMAL LOW (ref 39.0–52.0)
Hemoglobin: 7.3 g/dL — ABNORMAL LOW (ref 13.0–17.0)
Hemoglobin: 7.8 g/dL — ABNORMAL LOW (ref 13.0–17.0)

## 2022-01-05 SURGERY — IMAGING PROCEDURE, GI TRACT, INTRALUMINAL, VIA CAPSULE

## 2022-01-05 SURGERY — ESOPHAGOGASTRODUODENOSCOPY (EGD) WITH PROPOFOL
Anesthesia: General

## 2022-01-05 MED ORDER — PROPOFOL 10 MG/ML IV BOLUS
INTRAVENOUS | Status: DC | PRN
  Administered 2022-01-05: 80 mg via INTRAVENOUS

## 2022-01-05 MED ORDER — PROPOFOL 500 MG/50ML IV EMUL
INTRAVENOUS | Status: DC | PRN
  Administered 2022-01-05: 150 ug/kg/min via INTRAVENOUS

## 2022-01-05 MED ORDER — SODIUM CHLORIDE 0.9 % IV SOLN: INTRAVENOUS | Status: DC

## 2022-01-05 MED ORDER — LIDOCAINE HCL (CARDIAC) PF 100 MG/5ML IV SOSY
PREFILLED_SYRINGE | INTRAVENOUS | Status: DC | PRN
  Administered 2022-01-05: 50 mg via INTRAVENOUS

## 2022-01-05 NOTE — Progress Notes (Signed)
PROGRESS NOTE    Carlos Neal.  VZC:[id number] DOB: Jun 01, 1940 DOA: 01/03/2022 PCP: Dion Body, MD   Brief Narrative:  [name]. is a 82 y.o. 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 Hemoglobin has been downtrending from 2021-2023 from baseline of 15-->11.7-->9.7  a month ago .   No recent phlebotomy treatments for hematochromatosis Patient reports dark stool but stool guaiac negative.Was on prophylactic Lovenox from 12/5 to 12/20.  Currently on Cox 2 inhibitors at home. DC Celebrex IV Protonix GI consult to evaluate for possibility of GI blood loss, they plan scopes today. Follow anemia panel Now s/p transfusion of 2 units PRBCs in the ED with no s/s of bleeding Serial H&H He is NPO this AM and has been prepped  Positive D dimer lower extremity Doppler--negative  Chronic pain syndrome Degenerative disc disease Avoid NSAIDs and Cox  2's Oxycodone as needed  Hereditary hemochromatosis ([place]) No recent phlebotomy treatments  Essential hypertension BP stable.  Continue home HCTZ and losartan  DVT prophylaxis: SCDs Code Status: FULL Family Communication:  Discussed with patient in ER this AM.  Status is: Observation   Body mass index is 23.01 kg/m.   Subjective: Seen resting in ER, no complaints, stools are clear after prep and wondering when the GI procedures will be.  Examination:  General exam: Appears calm and comfortable, less pale. Respiratory system: Clear to auscultation. Respiratory effort normal. Cardiovascular system: S1 & S2 heard, RRR. No JVD, murmurs, rubs, gallops or clicks. No pedal edema. Gastrointestinal system: Abdomen is nondistended, soft and nontender. No organomegaly or masses felt. Normal bowel sounds heard. Central nervous system: Alert and oriented. No focal neurological deficits. Extremities: Symmetric 5 x 5 power. Skin: No rashes, lesions or ulcers Psychiatry: Judgement and insight appear normal. Mood & affect appropriate.     Objective: Vitals:   01/05/22 0130 01/05/22 0200 01/05/22 0400 01/05/22 0600  BP: (!) 120/57 (!) 91/55 (!) 104/41 (!) 122/59  Pulse: 79 67 77 76  Resp: 18 18 18 18   Temp:   98.9 F (37.2 C) 99.1 F (37.3 C)  TempSrc:      SpO2: 99% 97% 98% 94%  Weight:      Height:       No intake or output data in the 24 hours ending 01/05/22 0751  Filed Weights   01/03/22 1729  Weight: 60.8 kg     Data Reviewed:   CBC: Recent Labs  Lab 01/03/22 1732 01/04/22 0410 01/04/22 1335 01/04/22 2119 01/05/22 0426  WBC 5.9  --   --   --  6.2  HGB 5.2* 7.0* 7.5* 7.9* 7.3*  7.3*  HCT 17.7* 21.6* 22.8* 24.9* 23.3*  22.8*  MCV 79.4*  --   --   --  79.4*  PLT 370  --   --   --  161    Basic Metabolic Panel: Recent Labs  Lab 01/03/22 1732 01/05/22 0426  NA 136 133*  K 3.9 3.6  CL 104 107  CO2 23 21*  GLUCOSE 114* 99  BUN 31* 12  CREATININE 0.89  0.80  CALCIUM 8.7* 8.0*    GFR: Estimated Creatinine Clearance: 62.7 mL/min (by C-G formula based on SCr of 0.8 mg/dL). Liver Function Tests: Recent Labs  Lab 01/03/22 1732  AST 16  ALT 6  ALKPHOS 50  BILITOT 0.8  PROT 6.3*  ALBUMIN 3.8    No results for input(s): "LIPASE", "AMYLASE" in the last 168 hours. No results for input(s): "AMMONIA" in the last 168 hours. Coagulation Profile: No results for input(s): "INR", "PROTIME" in the last 168 hours. Cardiac Enzymes: No results for input(s): "CKTOTAL", "CKMB", "CKMBINDEX", "TROPONINI" in the last 168 hours. BNP (last 3 results) No results for input(s): "PROBNP" in the last 8760 hours. HbA1C: No results for input(s): "HGBA1C" in the last 72 hours. CBG: No results for input(s): "GLUCAP" in the last 168 hours. Lipid Profile: No results for input(s): "CHOL", "HDL", "LDLCALC", "TRIG", "CHOLHDL", "LDLDIRECT" in the last 72 hours. Thyroid Function Tests: No results for input(s): "TSH", "T4TOTAL", "FREET4", "T3FREE", "THYROIDAB" in the last 72 hours. Anemia Panel: Recent Labs    01/03/22 2115  FERRITIN 6*  TIBC 328  IRON 20*  RETICCTPCT 4.3*    Sepsis Labs: No results for input(s): "PROCALCITON", "LATICACIDVEN" in the last 168 hours.  No results found for this or any previous visit (from the past 240 hour(s)).   Radiology Studies: US Venous Img Lower Bilateral (DVT)  Result Date: 01/03/2022 CLINICAL DATA:  Elevated D-dimer. EXAM: BILATERAL LOWER EXTREMITY VENOUS DOPPLER ULTRASOUND TECHNIQUE: Gray-scale sonography with graded compression, as well as color Doppler and duplex ultrasound were performed to evaluate the lower extremity deep venous systems from the level of the common femoral vein and including the common femoral, femoral, profunda femoral, popliteal and calf veins including the posterior tibial, peroneal and gastrocnemius veins when visible. The superficial great saphenous vein was also interrogated. Spectral  Doppler was utilized to evaluate flow at rest and with distal augmentation maneuvers in the common femoral, femoral and popliteal veins. COMPARISON:  None Available. FINDINGS: RIGHT LOWER EXTREMITY Common Femoral Vein: No evidence of thrombus. Normal compressibility, respiratory phasicity and response to augmentation. Saphenofemoral Junction: No evidence of thrombus. Normal compressibility and flow on color Doppler imaging. Profunda Femoral Vein: No evidence of thrombus. Normal compressibility and flow on color Doppler imaging. Femoral Vein: No evidence of thrombus. Normal compressibility, respiratory phasicity and response to augmentation. Popliteal Vein: No evidence of thrombus. Normal compressibility, respiratory phasicity and response to augmentation. Calf Veins: No evidence of thrombus. Normal compressibility and flow on color Doppler imaging. Superficial Great Saphenous Vein: No evidence of thrombus. Normal compressibility. Venous Reflux:  None. Other Findings:  None. LEFT LOWER EXTREMITY Common Femoral Vein: No evidence of thrombus. Normal compressibility, respiratory phasicity and response to augmentation. Saphenofemoral Junction: No evidence of thrombus. Normal compressibility and flow on color Doppler imaging. Profunda Femoral Vein: No evidence of thrombus. Normal compressibility and flow on color Doppler imaging. Femoral Vein: No evidence of thrombus. Normal compressibility,  respiratory phasicity and response to augmentation. Popliteal Vein: No evidence of thrombus. Normal compressibility, respiratory phasicity and response to augmentation. Calf Veins: No evidence of thrombus. Normal compressibility and flow on color Doppler imaging. Superficial Great Saphenous Vein: No evidence of thrombus. Normal compressibility. Venous Reflux:  None. Other Findings:  None. IMPRESSION: No evidence of deep venous thrombosis in either lower extremity. Electronically Signed   By: Virgina Norfolk M.D.   On: 01/03/2022  22:46   DG Chest Port 1 View  Result Date: 01/03/2022 CLINICAL DATA:  Shortness of breath EXAM: PORTABLE CHEST 1 VIEW COMPARISON:  None Available. FINDINGS: Transverse diameter of heart is increased. There is moderate sized fixed hiatal hernia. Lung fields are clear of any infiltrates or pulmonary edema. There is no pleural effusion or pneumothorax. Degenerative changes are noted in cervical spine. Degenerative changes are noted in both shoulders, more so in the left shoulder. IMPRESSION: There are no signs of pulmonary edema or focal pulmonary consolidation. Moderate sized fixed hiatal hernia. Electronically Signed   By: Elmer Picker M.D.   On: 01/03/2022 20:30    Scheduled Meds:  hydrochlorothiazide  12.5 mg Oral Daily   losartan  50 mg Oral QHS   pantoprazole (PROTONIX) IV  40 mg Intravenous QHS   tamsulosin  0.8 mg Oral QHS   Continuous Infusions:  sodium chloride 20 mL/hr at 01/05/22 0116     LOS: 0 days   Time spent= 25 mins    Bentli Llorente Marry Guan, MD [facility]  If 7PM-7AM, please contact night-coverage  01/05/2022, 7:51 AM

## 2022-01-05 NOTE — Transfer of Care (Signed)
Immediate Anesthesia Transfer of Care Note  Patient: Carlos Neal.  Procedure(s) Performed: ESOPHAGOGASTRODUODENOSCOPY (EGD) WITH PROPOFOL COLONOSCOPY  Patient Location: Endoscopy Unit  Anesthesia Type:General  Level of Consciousness: sedated  Airway & Oxygen Therapy: Patient Spontanous Breathing  Post-op Assessment: Report given to RN and Post -op Vital signs reviewed and stable  Post vital signs: Reviewed and stable  Last Vitals:  Vitals Value Taken Time  BP 90/57 01/05/22 1413  Temp    Pulse 67 01/05/22 1413  Resp 15 01/05/22 1413  SpO2 98 % 01/05/22 1413    Last Pain:  Vitals:   01/05/22 1315  TempSrc: Temporal  PainSc: 0-No pain         Complications: No notable events documented.

## 2022-01-05 NOTE — TOC Initial Note (Signed)
Transition of Care (TOC) - Initial/Assessment Note    Patient Details  Name: Carlos Neal. MRN: [id number] Date of Birth: 1940-01-17  Transition of Care [facility]) CM/SW Contact:    Shelbie Hutching, RN Phone Number: 01/05/2022, 11:24 AM  Clinical Narrative:                 Patient placed under observation for symptomatic anemia going for and EGD with GI today.  RNCM met with patient and his wife at the bedside.  Patient is from home with wife, independent at home, recently had hip replacement and sent home with home health PT through [facility].  Patient reports that he has one more [place] PT visit.   Wife will transport patient home, he may be able to DC after GI procedure today.  Expected Discharge Plan: Home/Self Care Barriers to Discharge: Continued Medical Work up   Patient Goals and CMS Choice Patient states their goals for this hospitalization and ongoing recovery are:: wants to get the GI procedure overwith and hopefully go home          Expected Discharge Plan and Services     Post Acute Care Choice: Resumption of Svcs/PTA Provider Living arrangements for the past 2 months: Single Family Home                 DME Arranged: N/A DME Agency: NA       HH Arranged: PT [place] Agency: [place]        Prior Living Arrangements/Services Living arrangements for the past 2 months: Single Family Home Lives with:: Spouse Patient language and need for interpreter reviewed:: Yes Do you feel safe going back to the place where you live?: Yes      Need for Family Participation in Patient Care: Yes (Comment) Care giver support system in place?: Yes (comment) Current home services: DME (rolling walker) Criminal Activity/Legal Involvement Pertinent to Current Situation/Hospitalization: No - Comment as needed  Activities of Daily Living Home Assistive Devices/Equipment: Walker (specify type), Cane (specify quad or straight) ADL Screening (condition at time of  admission) Patient's cognitive ability adequate to safely complete daily activities?: Yes Is the patient deaf or have difficulty hearing?: No Does the patient have difficulty seeing, even when wearing glasses/contacts?: No Does the patient have difficulty concentrating, remembering, or making decisions?: No Patient able to express need for assistance with ADLs?: Yes Does the patient have difficulty dressing or bathing?: No Independently performs ADLs?: No Communication: Independent Dressing (OT): Independent Grooming: Independent Feeding: Independent Bathing: Independent Toileting: Needs assistance Is this a change from baseline?: Change from baseline, expected to last <3 days In/Out Bed: Needs assistance Is this a change from baseline?: Change from baseline, expected to last <3 days Walks in Home: Needs assistance Is this a change from baseline?: Change from baseline, expected to last <3 days Does the patient have difficulty walking or climbing stairs?: Yes Weakness of Legs: Both Weakness of Arms/Hands: Both  Permission Sought/Granted Permission sought to share information with : Family Supports    Share Information with NAME: [place] granted to share info w Relationship: spouse  Permission granted to share info w Contact Information: [phone]  Emotional Assessment Appearance:: Appears stated age Attitude/Demeanor/Rapport: Engaged Affect (typically observed): Accepting Orientation: : Oriented to Self, Oriented to Place, Oriented to  Time, Oriented to Situation Alcohol / Substance Use: Not Applicable Psych Involvement: No (comment)  Admission diagnosis:  Symptomatic anemia [D64.9] Patient Active Problem List  Diagnosis Date Noted   Symptomatic anemia 01/03/2022   Positive D dimer 01/03/2022   H/O total hip arthroplasty, left 12/04/21 12/04/2021   Chronic left shoulder pain 11/09/2021   Primary osteoarthritis of left hip 08/22/2021   Status post  total right knee replacement 01/30/2021   B12 deficiency 03/16/2020   BPH associated with nocturia 03/16/2020   Chronic pain syndrome 12/23/2019   Pharmacologic therapy 12/23/2019   Disorder of skeletal system 12/23/2019   Problems influencing health status 42/87/6811   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[place]) 12/23/2019   Chronic lower extremity pain (1ry area of Pain) (Right) 12/23/2019   Chronic low back pain (2ry area of Pain) (Bilateral) (R>L) w/ sciatica (Right) 12/23/2019   Abnormal MRI, lumbar spine (12/04/2019) 12/23/2019   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) 12/23/2019   Retrolisthesis (T12-L1, L1-2) 12/23/2019   Lumbosacral foraminal stenosis 12/23/2019   Lumbar facet arthropathy 12/23/2019   Osteoarthritis of facet joint of lumbar spine 12/23/2019   Lumbar facet syndrome (Bilateral) 12/23/2019   Wedge compression fracture of T12 vertebra, sequela 12/23/2019   DDD (degenerative disc disease), lumbosacral 12/23/2019   DDD (degenerative disc disease), cervical 12/23/2019   Cervical facet syndrome (Right) 12/23/2019   Cervicalgia 12/23/2019   Chronic neck pain (Right) 12/23/2019   Essential hypertension 12/20/2019   GERD without esophagitis 12/20/2019   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   Impotence 12/20/2019   Insomnia 12/20/2019   Osteopenia 12/20/2019   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/21/2019   Spondylosis of cervical region without myelopathy or radiculopathy 02/20/2019   Osteopenia of multiple sites 09/11/2017   Encounter for general adult medical examination without abnormal findings 12/01/2015   Vaccine counseling 12/01/2015   PCP:  Dion Body, MD Pharmacy:   [place] [place] [facility] [address] [place] [address] Phone: [phone] Fax: [phone]     Social Determinants of Health (SDOH) Social History: SDOH Screenings   Food Insecurity: No Food Insecurity  (01/04/2022)  Recent Concern: Food Insecurity - Food Insecurity Present (12/04/2021)  Housing: Low Risk  (01/04/2022)  Transportation Needs: No Transportation Needs (01/04/2022)  Utilities: Not At Risk (01/04/2022)  Depression (PHQ2-9): Low Risk  (12/22/2019)  Tobacco Use: Low Risk  (01/04/2022)   SDOH Interventions:     Readmission Risk Interventions     No data to display

## 2022-01-05 NOTE — ED Notes (Signed)
Pt requesting tylenol for arthritis issues that he takes daily, NPO order in place, Admitting MD asked if pt can have tylenol with sips of water, MD states this is okay at this time.

## 2022-01-05 NOTE — Op Note (Signed)
[facility] Gastroenterology Patient Name: Carlos Neal Procedure Date: 01/05/2022 1:34 PM MRN: [id number] Account #: [id number] Date of Birth: [birth year] Admit Type: Outpatient Age: 82 Room: [facility] ENDO ROOM 3 Gender: Male Note Status: Finalized Instrument Name: Michaelle Birks [id number] Procedure:             Upper GI endoscopy Indications:           Unexplained iron deficiency anemia Providers:             Benay Pike. Alice Reichert MD, MD Referring MD:          Dion Body (Referring MD) Medicines:             Propofol per Anesthesia Complications:         No immediate complications. Procedure:             Pre-Anesthesia Assessment:                        - The risks and benefits of the procedure and the                         sedation options and risks were discussed with the                         patient. All questions were answered and informed                         consent was obtained.                        - Patient identification and proposed procedure were                         verified prior to the procedure by the nurse. The                         procedure was verified in the procedure room.                        - ASA Grade Assessment: III - A patient with severe                         systemic disease.                        - After reviewing the risks and benefits, the patient                         was deemed in satisfactory condition to undergo the                         procedure.                        After obtaining informed consent, the endoscope was                         passed under direct vision. Throughout the procedure,                         the  patient's blood pressure, pulse, and oxygen                         saturations were monitored continuously. The Endoscope                         was introduced through the mouth, and advanced to the                         third part of duodenum. The upper GI endoscopy was                          accomplished without difficulty. The patient tolerated                         the procedure well. Findings:      The esophagus was normal.      A large hiatal hernia was present.      Patchy minimal inflammation characterized by erosions was found in the       gastric antrum.      The examined duodenum was normal.      The exam was otherwise without abnormality. Impression:            - Normal esophagus.                        - Large hiatal hernia.                        - Gastritis.                        - Normal examined duodenum.                        - The examination was otherwise normal.                        - No specimens collected. Recommendation:        - Proceed with colonoscopy Procedure Code(s):     --- Professional ---                        [phone], Esophagogastroduodenoscopy, flexible,                         transoral; diagnostic, including collection of                         specimen(s) by brushing or washing, when performed                         (separate procedure) Diagnosis Code(s):     --- Professional ---                        D50.9, Iron deficiency anemia, unspecified                        K29.70, Gastritis, unspecified, without bleeding                        K44.9, Diaphragmatic hernia without  obstruction or                         gangrene CPT copyright 2022 American Medical Association. All rights reserved. The codes documented in this report are preliminary and upon coder review may  be revised to meet current compliance requirements. Stanton Kidney MD, MD 01/05/2022 1:52:21 PM This report has been signed electronically. Number of Addenda: 0 Note Initiated On: 01/05/2022 1:34 PM Estimated Blood Loss:  Estimated blood loss: none.      [facility]

## 2022-01-05 NOTE — Interval H&P Note (Signed)
History and Physical Interval Note:  01/05/2022 1:26 PM  Carlos Prescott Gum.  has presented today for surgery, with the diagnosis of Symptomatic anemia, IDA.  The various methods of treatment have been discussed with the patient and family. After consideration of risks, benefits and other options for treatment, the patient has consented to  Procedure(s): ESOPHAGOGASTRODUODENOSCOPY (EGD) WITH PROPOFOL (N/A) COLONOSCOPY (N/A) as a surgical intervention.  The patient's history has been reviewed, patient examined, no change in status, stable for surgery.  I have reviewed the patient's chart and labs.  Questions were answered to the patient's satisfaction.     [place], [place]

## 2022-01-05 NOTE — Care Management Obs Status (Signed)
[facility] NOTIFICATION   Patient Details  Name: Carlos Neal. MRN: [id number] Date of Birth: [birth year]   Medicare Observation Status Notification Given:  Yes    Shelbie Hutching, RN 01/05/2022, 11:16 AM

## 2022-01-05 NOTE — Anesthesia Procedure Notes (Signed)
Date/Time: 01/05/2022 1:41 PM  Performed by: Johnna Acosta, CRNAPre-anesthesia Checklist: Patient identified, Emergency Drugs available, Suction available, Patient being monitored and Timeout performed Patient Re-evaluated:Patient Re-evaluated prior to induction Oxygen Delivery Method: Nasal cannula Induction Type: IV induction

## 2022-01-05 NOTE — Anesthesia Preprocedure Evaluation (Signed)
Anesthesia Evaluation  Patient identified by MRN, date of birth, ID band Patient awake    Reviewed: Allergy & Precautions, NPO status , Patient's Chart, lab work & pertinent test results  History of Anesthesia Complications Negative for: history of anesthetic complications  Airway Mallampati: III  TM Distance: <3 FB Neck ROM: full    Dental  (+) Chipped, Missing   Pulmonary neg shortness of breath, asthma    Pulmonary exam normal        Cardiovascular Exercise Tolerance: Good hypertension, (-) angina Normal cardiovascular exam     Neuro/Psych  Neuromuscular disease  negative psych ROS   GI/Hepatic Neg liver ROS,GERD  Controlled,,  Endo/Other  negative endocrine ROS    Renal/GU negative Renal ROS  negative genitourinary   Musculoskeletal   Abdominal   Peds  Hematology negative hematology ROS (+)   Anesthesia Other Findings Past Medical History: No date: Anemia No date: Asthma     Comment:  as a child No date: GERD (gastroesophageal reflux disease) No date: Hypertension No date: Osteoarthritis     Comment:  back and knee No date: Osteopenia No date: Osteoporosis No date: Vitamin D deficiency  Past Surgical History: 09/23/2020: BROW LIFT; Bilateral     Comment:  Procedure: BLEPHAROPTOSIS REPAIR; RESECT EX  BROW PTOSIS              REPAIR ECTROPION REPAIR, EXTENSIVE BILATERAL;  Surgeon:               Karle Starch, MD;  Location: [facility];                Service: Ophthalmology;  Laterality: Bilateral;  wants to              be later in the schedule 01/12/2013: COLONOSCOPY No date: HYDROCELE EXCISION     Comment:  age 8 No date: INGUINAL HERNIA REPAIR; Bilateral     Comment:  as a child 01/30/2021: KNEE ARTHROPLASTY; Right     Comment:  Procedure: COMPUTER ASSISTED TOTAL KNEE ARTHROPLASTY;                Surgeon: Dereck Leep, MD;  Location: [facility];                Service: Orthopedics;   Laterality: Right; 01/15/2020: LUMBAR LAMINECTOMY/ DECOMPRESSION WITH MET-RX; Right     Comment:  Procedure: L2-3 DECOMPRESSION, RIGHT L2-3 FAR LATERAL               DISCECTOMY;  Surgeon: Meade Maw, MD;  Location:               [facility];  Service: Neurosurgery;  Laterality: Right;                2nd case 12/04/2021: TOTAL HIP ARTHROPLASTY; Left     Comment:  Procedure: TOTAL HIP ARTHROPLASTY;  Surgeon: Dereck Leep, MD;  Location: [facility];  Service: Orthopedics;               Laterality: Left;  BMI    Body Mass Index: 23.01 kg/m      Reproductive/Obstetrics negative OB ROS                             Anesthesia Physical Anesthesia Plan  ASA: 3  Anesthesia Plan: General   Post-op Pain Management:  Induction: Intravenous  PONV Risk Score and Plan: Propofol infusion and TIVA  Airway Management Planned: Natural Airway and Nasal Cannula  Additional Equipment:   Intra-op Plan:   Post-operative Plan:   Informed Consent: I have reviewed the patients History and Physical, chart, labs and discussed the procedure including the risks, benefits and alternatives for the proposed anesthesia with the patient or authorized representative who has indicated his/her understanding and acceptance.   Patient has DNR.  Discussed DNR with patient and Suspend DNR.   Dental Advisory Given  Plan Discussed with: Anesthesiologist, CRNA and Surgeon  Anesthesia Plan Comments: (Patient consented for risks of anesthesia including but not limited to:  - adverse reactions to medications - risk of airway placement if required - damage to eyes, teeth, lips or other oral mucosa - nerve damage due to positioning  - sore throat or hoarseness - Damage to heart, brain, nerves, lungs, other parts of body or loss of life  Patient voiced understanding.)       Anesthesia Quick Evaluation

## 2022-01-05 NOTE — Op Note (Signed)
[facility] Gastroenterology Patient Name: Carlos Neal Procedure Date: 01/05/2022 1:32 PM MRN: [id number] Account #: [id number] Date of Birth: 30-Jul-1940 Admit Type: Outpatient Age: 82 Room: [facility] ENDO ROOM 3 Gender: Male Note Status: Finalized Instrument Name: Colonoscope [id number] Procedure:             Colonoscopy Indications:           Unexplained iron deficiency anemia Providers:             Boykin Nearing. Norma Fredrickson MD, MD Referring MD:          Marisue Ivan (Referring MD) Medicines:             Propofol per Anesthesia Complications:         No immediate complications. Procedure:             Pre-Anesthesia Assessment:                        - The risks and benefits of the procedure and the                         sedation options and risks were discussed with the                         patient. All questions were answered and informed                         consent was obtained.                        - Patient identification and proposed procedure were                         verified prior to the procedure by the nurse. The                         procedure was verified in the procedure room.                        - ASA Grade Assessment: III - A patient with severe                         systemic disease.                        - After reviewing the risks and benefits, the patient                         was deemed in satisfactory condition to undergo the                         procedure.                        After obtaining informed consent, the colonoscope was                         passed under direct vision. Throughout the procedure,                         the patient's blood  pressure, pulse, and oxygen                         saturations were monitored continuously. The                         Colonoscope was introduced through the anus and                         advanced to the the cecum, identified by appendiceal                          orifice and ileocecal valve. The colonoscopy was                         performed without difficulty. The patient tolerated                         the procedure well. The quality of the bowel                         preparation was adequate to identify polyps greater                         than 5 mm in size. The ileocecal valve, appendiceal                         orifice, and rectum were photographed. Findings:      The perianal and digital rectal examinations were normal. Pertinent       negatives include normal sphincter tone and no palpable rectal lesions.      Non-bleeding internal hemorrhoids were found during retroflexion. The       hemorrhoids were Grade I (internal hemorrhoids that do not prolapse).      Multiple large-mouthed and small-mouthed diverticula were found in the       entire colon. There was no evidence of diverticular bleeding.      There is no endoscopic evidence of bleeding, inflammation, mass, polyps,       ulcerations or angioectasia in the entire colon.      The exam was otherwise without abnormality. Impression:            - Non-bleeding internal hemorrhoids.                        - Moderate diverticulosis in the entire examined                         colon. There was no evidence of diverticular bleeding.                        - The examination was otherwise normal.                        - No specimens collected. Recommendation:        - Return patient to hospital ward for ongoing care.                        - To visualize the small bowel, perform video capsule  endoscopy tomorrow.                        - Clear liquid diet today.                        - Continue present medications. Procedure Code(s):     --- Professional ---                        [phone], Colonoscopy, flexible; diagnostic, including                         collection of specimen(s) by brushing or washing, when                         performed (separate  procedure) Diagnosis Code(s):     --- Professional ---                        K57.30, Diverticulosis of large intestine without                         perforation or abscess without bleeding                        D50.9, Iron deficiency anemia, unspecified                        K64.0, First degree hemorrhoids CPT copyright 2022 American Medical Association. All rights reserved. The codes documented in this report are preliminary and upon coder review may  be revised to meet current compliance requirements. Efrain Sella MD, MD 01/05/2022 2:15:04 PM This report has been signed electronically. Number of Addenda: 0 Note Initiated On: 01/05/2022 1:32 PM Scope Withdrawal Time: 0 hours 5 minutes 32 seconds  Total Procedure Duration: 0 hours 11 minutes 33 seconds  Estimated Blood Loss:  Estimated blood loss: none. Estimated blood loss: none.      [facility]

## 2022-01-05 NOTE — ED Notes (Signed)
Pt care taken, wife at bedside, no complaints at this time.

## 2022-01-06 DIAGNOSIS — D649 Anemia, unspecified: Secondary | ICD-10-CM | POA: Diagnosis not present

## 2022-01-06 LAB — BASIC METABOLIC PANEL
Anion gap: 7 (ref 5–15)
BUN: 13 mg/dL (ref 8–23)
CO2: 22 mmol/L (ref 22–32)
Calcium: 8.5 mg/dL — ABNORMAL LOW (ref 8.9–10.3)
Chloride: 110 mmol/L (ref 98–111)
Creatinine, Ser: 0.8 mg/dL (ref 0.61–1.24)
GFR, Estimated: >60 mL/min (ref >60)
Glucose, Bld: 93 mg/dL (ref 70–99)
Potassium: 3.7 mmol/L (ref 3.5–5.1)
Sodium: 139 mmol/L (ref 135–145)

## 2022-01-06 LAB — CBC
HCT: 23.6 % — ABNORMAL LOW (ref 39.0–52.0)
Hemoglobin: 7.4 g/dL — ABNORMAL LOW (ref 13.0–17.0)
MCH: 25.1 pg — ABNORMAL LOW (ref 26.0–34.0)
MCHC: 31.4 g/dL (ref 30.0–36.0)
MCV: 80 fL (ref 80.0–100.0)
Platelets: 302 10*3/uL (ref 150–400)
RBC: 2.95 MIL/uL — ABNORMAL LOW (ref 4.22–5.81)
RDW: 16.6 % — ABNORMAL HIGH (ref 11.5–15.5)
WBC: 6.3 10*3/uL (ref 4.0–10.5)
nRBC: 0 % (ref 0.0–0.2)

## 2022-01-06 NOTE — Progress Notes (Signed)
Spoke with Dr. Alice Reichert from GI services and was told that the patients diet could be advanced to a regular diet after midnight. This verbal order was placed.

## 2022-01-06 NOTE — Plan of Care (Signed)

## 2022-01-06 NOTE — Discharge Summary (Signed)
Discharge Summary  Carlos Neal File. KDX:[id number] DOB: 05/06/1940  PCP: Marisue Ivan, MD  Admit date: 01/03/2022 Discharge date: 01/06/2022   Recommendations for Outpatient Follow-up:  Please follow up with your PCP with CBC within 3 days. Follow up with GI within 2 weeks as noted below.  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   Discharge Condition: Stable   Diet recommendation: Diet Orders (From admission, onward)     Start     Ordered   01/06/22 0628  Diet regular Room service appropriate? Yes; Fluid consistency: Thin  Diet effective now       Question Answer Comment  Room service appropriate? Yes   Fluid consistency: Thin      01/06/22 0627           HPI and Brief Hospital Course:  Carlos Neal. is a 82 y.o. 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 He was transfused 2 units of blood and tolerated it well. He was seen by GI and had EGD and colonoscopy on 1/5 with no signs of bleeding. He has been stable overnight and feeling well, repeat Hb  7.4. He completed capsule endoscopy and per Dr. Norma Fredrickson he is ready for DC home today can follow up as outpatient for capsule endoscopy results.  Procedures: EGD and colonoscopy 1/5 Capsule Endoscopy completed 1/6  Consultations: GI   Discharge details, plan of care and follow up instructions were discussed with patient and any available family or care providers. Patient and family are in agreement with discharge from the hospital today and all questions were answered to their satisfaction.  Discharge Exam: BP 124/71 (BP Location: Right Arm)   Pulse 78   Temp 97.9 F (36.6 C)   Resp 16   Ht 5\' 4"  (1.626 m)   Wt 60.8 kg   SpO2 99%   BMI 23.01 kg/m  General:  Alert, oriented, calm, in no acute distress, resting comfortably in bed  Eyes: EOMI, clear sclerea Neck: supple, no masses, trachea mildline  Cardiovascular: RRR, no murmurs or rubs, no peripheral edema  Respiratory: clear to auscultation bilaterally, no wheezes, no crackles  Abdomen: soft, nontender, nondistended, normal bowel tones heard  Skin: dry, no rashes  Musculoskeletal: no joint effusions, normal range of motion  Psychiatric: appropriate affect, normal speech  Neurologic: extraocular muscles intact, clear speech, moving all extremities with intact sensorium   Discharge Instructions You were cared for by a hospitalist during your hospital stay. If you have any questions about your discharge medications or the care you received while you were in the hospital after you are discharged, you  can call the unit and asked to speak with the hospitalist on call if the hospitalist that took care of you is not available. Once you are discharged, your primary care physician will handle any further medical issues. Please note that NO REFILLS for any discharge medications will be authorized once you are discharged, as it is imperative that you return to your primary care physician (or establish a relationship with a primary care  physician if you do not have one) for your aftercare needs so that they can reassess your need for medications and monitor your lab values.   Allergies as of 01/06/2022   No Known Allergies      Medication List     STOP taking these medications    celecoxib 200 MG capsule Commonly known as: CELEBREX   enoxaparin 40 MG/0.4ML injection Commonly known as: LOVENOX   PREVAGEN PO       TAKE these medications    acetaminophen 500 MG tablet Commonly known as: TYLENOL Take 1,000 mg by mouth every 6 (six) hours as needed for moderate pain.   brimonidine 0.1 % Soln Commonly known as: ALPHAGAN P Place 1 drop into both eyes in the morning and at bedtime.   CALCIUM PO Take 600 mg by mouth daily.   carboxymethylcellulose 0.5 % Soln Commonly known as: REFRESH PLUS Place 1 drop into both eyes daily as needed (dry eyes).   diclofenac Sodium 1 % Gel Commonly known as: VOLTAREN Apply 1 application topically 4 (four) times daily as needed (pain).   hydrochlorothiazide 25 MG tablet Commonly known as: HYDRODIURIL Take 25 mg by mouth daily. Takes 1/2 tab am   losartan 25 MG tablet Commonly known as: COZAAR Take 25 mg by mouth at bedtime. Takes 2 tablets = 50 mg   Melatonin 10 MG Caps Take 10 mg by mouth at bedtime as needed (sleep).   oxyCODONE 5 MG immediate release tablet Commonly known as: Oxy IR/ROXICODONE Take 1 tablet (5 mg total) by mouth every 4 (four) hours as needed for severe pain.   PreserVision AREDS 2 Caps Take 1 capsule by mouth daily.   RECLAST IV Inject into the vein. Once per year   RIBOFLAVIN PO Take 1 tablet by mouth daily.   Salonpas 3.01-07-08 % Ptch Generic drug: Camphor-Menthol-Methyl Sal Place 1 patch onto the skin daily as needed (pain).   tamsulosin 0.4 MG Caps capsule Commonly known as: FLOMAX Take 0.8 mg by mouth at bedtime.   traMADol 50 MG tablet Commonly known as: ULTRAM Take 1 tablet (50 mg total) by mouth every 4 (four) hours as  needed for moderate pain.   traZODone 50 MG tablet Commonly known as: DESYREL Take 50 mg by mouth at bedtime as needed for sleep.   vitamin B-12 100 MCG tablet Commonly known as: CYANOCOBALAMIN Take 200 mcg by mouth daily.   VITAMIN D3 PO Take 1 tablet by mouth daily.       No Known Allergies  Follow-up Information     Marisue Ivan, MD Follow up.   Specialty: Family Medicine Why: Please follow up with PCP in the next 2-3 days with Hb check. Contact information: [address] [facility] [place] [address] [phone]         Stanton Kidney, MD Follow up in 2 week(s).   Specialty: Gastroenterology Why: To discuss results of capsule endoscopy. Contact information: [address] [place] [address] [phone]  The results of significant diagnostics from this hospitalization (including imaging, microbiology, ancillary and laboratory) are listed below for reference.    Significant Diagnostic Studies: US Venous Img Lower Bilateral (DVT)  Result Date: 01/03/2022 CLINICAL DATA:  Elevated D-dimer. EXAM: BILATERAL LOWER EXTREMITY VENOUS DOPPLER ULTRASOUND TECHNIQUE: Gray-scale sonography with graded compression, as well as color Doppler and duplex ultrasound were performed to evaluate the lower extremity deep venous systems from the level of the common femoral vein and including the common femoral, femoral, profunda femoral, popliteal and calf veins including the posterior tibial, peroneal and gastrocnemius veins when visible. The superficial great saphenous vein was also interrogated. Spectral Doppler was utilized to evaluate flow at rest and with distal augmentation maneuvers in the common femoral, femoral and popliteal veins. COMPARISON:  None Available. FINDINGS: RIGHT LOWER EXTREMITY Common Femoral Vein: No evidence of thrombus. Normal compressibility, respiratory phasicity and response to augmentation.  Saphenofemoral Junction: No evidence of thrombus. Normal compressibility and flow on color Doppler imaging. Profunda Femoral Vein: No evidence of thrombus. Normal compressibility and flow on color Doppler imaging. Femoral Vein: No evidence of thrombus. Normal compressibility, respiratory phasicity and response to augmentation. Popliteal Vein: No evidence of thrombus. Normal compressibility, respiratory phasicity and response to augmentation. Calf Veins: No evidence of thrombus. Normal compressibility and flow on color Doppler imaging. Superficial Great Saphenous Vein: No evidence of thrombus. Normal compressibility. Venous Reflux:  None. Other Findings:  None. LEFT LOWER EXTREMITY Common Femoral Vein: No evidence of thrombus. Normal compressibility, respiratory phasicity and response to augmentation. Saphenofemoral Junction: No evidence of thrombus. Normal compressibility and flow on color Doppler imaging. Profunda Femoral Vein: No evidence of thrombus. Normal compressibility and flow on color Doppler imaging. Femoral Vein: No evidence of thrombus. Normal compressibility, respiratory phasicity and response to augmentation. Popliteal Vein: No evidence of thrombus. Normal compressibility, respiratory phasicity and response to augmentation. Calf Veins: No evidence of thrombus. Normal compressibility and flow on color Doppler imaging. Superficial Great Saphenous Vein: No evidence of thrombus. Normal compressibility. Venous Reflux:  None. Other Findings:  None. IMPRESSION: No evidence of deep venous thrombosis in either lower extremity. Electronically Signed   By: Virgina Norfolk M.D.   On: 01/03/2022 22:46   DG Chest Port 1 View  Result Date: 01/03/2022 CLINICAL DATA:  Shortness of breath EXAM: PORTABLE CHEST 1 VIEW COMPARISON:  None Available. FINDINGS: Transverse diameter of heart is increased. There is moderate sized fixed hiatal hernia. Lung fields are clear of any infiltrates or pulmonary edema. There is no  pleural effusion or pneumothorax. Degenerative changes are noted in cervical spine. Degenerative changes are noted in both shoulders, more so in the left shoulder. IMPRESSION: There are no signs of pulmonary edema or focal pulmonary consolidation. Moderate sized fixed hiatal hernia. Electronically Signed   By: Elmer Picker M.D.   On: 01/03/2022 20:30    Microbiology: No results found for this or any previous visit (from the past 240 hour(s)).   Labs: Basic Metabolic Panel: Recent Labs  Lab 01/03/22 1732 01/05/22 0426 01/06/22 0445  NA 136 133* 139  K 3.9 3.6 3.7  CL 104 107 110  CO2 23 21* 22  GLUCOSE 114* 99 93  BUN 31* 12 13  CREATININE 0.89 0.80 0.80  CALCIUM 8.7* 8.0* 8.5*   Liver Function Tests: Recent Labs  Lab 01/03/22 1732  AST 16  ALT 6  ALKPHOS 50  BILITOT 0.8  PROT 6.3*  ALBUMIN 3.8   No results for input(s): "LIPASE", "AMYLASE" in the last 168  hours. No results for input(s): "AMMONIA" in the last 168 hours. CBC: Recent Labs  Lab 01/03/22 1732 01/04/22 0410 01/04/22 1335 01/04/22 2119 01/05/22 0426 01/05/22 1203 01/06/22 0445  WBC 5.9  --   --   --  6.2  --  6.3  HGB 5.2*   < > 7.5* 7.9* 7.3*  7.3* 7.8* 7.4*  HCT 17.7*   < > 22.8* 24.9* 23.3*  22.8* 24.9* 23.6*  MCV 79.4*  --   --   --  79.4*  --  80.0  PLT 370  --   --   --  292  --  302   < > = values in this interval not displayed.   Cardiac Enzymes: No results for input(s): "CKTOTAL", "CKMB", "CKMBINDEX", "TROPONINI" in the last 168 hours. BNP: BNP (last 3 results) No results for input(s): "BNP" in the last 8760 hours.  ProBNP (last 3 results) No results for input(s): "PROBNP" in the last 8760 hours.  CBG: No results for input(s): "GLUCAP" in the last 168 hours.  Time spent: > 30 minutes were spent in preparing this discharge including medication reconciliation, counseling, and coordination of care.  Signed:  Dionte Blaustein Vergie Living, MD  [facility] 01/06/2022, 11:00  AM

## 2022-01-06 NOTE — Plan of Care (Signed)
  Problem: Education: Goal: Knowledge of General Education information will improve Description: Including pain rating scale, medication(s)/side effects and non-pharmacologic comfort measures 01/06/2022 1123 by Shauna Hugh, RN Outcome: Adequate for Discharge 01/06/2022 0822 by Shauna Hugh, RN Outcome: Progressing   Problem: Health Behavior/Discharge Planning: Goal: Ability to manage health-related needs will improve 01/06/2022 1123 by Shauna Hugh, RN Outcome: Adequate for Discharge 01/06/2022 0822 by Shauna Hugh, RN Outcome: Progressing   Problem: Clinical Measurements: Goal: Ability to maintain clinical measurements within normal limits will improve 01/06/2022 1123 by Shauna Hugh, RN Outcome: Adequate for Discharge 01/06/2022 0822 by Shauna Hugh, RN Outcome: Progressing Goal: Will remain free from infection 01/06/2022 1123 by Shauna Hugh, RN Outcome: Adequate for Discharge 01/06/2022 0822 by Shauna Hugh, RN Outcome: Progressing Goal: Diagnostic test results will improve 01/06/2022 1123 by Shauna Hugh, RN Outcome: Adequate for Discharge 01/06/2022 2751 by Shauna Hugh, RN Outcome: Progressing Goal: Respiratory complications will improve 01/06/2022 1123 by Shauna Hugh, RN Outcome: Adequate for Discharge 01/06/2022 [phone] by Shauna Hugh, RN Outcome: Progressing Goal: Cardiovascular complication will be avoided 01/06/2022 1123 by Shauna Hugh, RN Outcome: Adequate for Discharge 01/06/2022 0822 by Shauna Hugh, RN Outcome: Progressing   Problem: Activity: Goal: Risk for activity intolerance will decrease 01/06/2022 1123 by Shauna Hugh, RN Outcome: Adequate for Discharge 01/06/2022 [phone] by Shauna Hugh, RN Outcome: Progressing   Problem: Nutrition: Goal: Adequate nutrition will be maintained 01/06/2022 1123 by Shauna Hugh, RN Outcome: Adequate for Discharge 01/06/2022 0822 by Shauna Hugh, RN Outcome: Progressing   Problem: Coping: Goal: Level of anxiety will  decrease 01/06/2022 1123 by Shauna Hugh, RN Outcome: Adequate for Discharge 01/06/2022 [phone] by Shauna Hugh, RN Outcome: Progressing   Problem: Elimination: Goal: Will not experience complications related to bowel motility 01/06/2022 1123 by Shauna Hugh, RN Outcome: Adequate for Discharge 01/06/2022 [phone] by Shauna Hugh, RN Outcome: Progressing Goal: Will not experience complications related to urinary retention 01/06/2022 1123 by Shauna Hugh, RN Outcome: Adequate for Discharge 01/06/2022 0822 by Shauna Hugh, RN Outcome: Progressing   Problem: Pain Managment: Goal: General experience of comfort will improve 01/06/2022 1123 by Shauna Hugh, RN Outcome: Adequate for Discharge 01/06/2022 [phone] by Shauna Hugh, RN Outcome: Progressing   Problem: Safety: Goal: Ability to remain free from injury will improve 01/06/2022 1123 by Shauna Hugh, RN Outcome: Adequate for Discharge 01/06/[phone] by Shauna Hugh, RN Outcome: Progressing   Problem: Skin Integrity: Goal: Risk for impaired skin integrity will decrease 01/06/2022 1123 by Shauna Hugh, RN Outcome: Adequate for Discharge 01/06/2022 0822 by Shauna Hugh, RN Outcome: Progressing

## 2022-01-06 NOTE — TOC Transition Note (Signed)
Transition of Care [facility]) - CM/SW Discharge Note   Patient Details  Name: Carlos Neal. MRN: [id number] Date of Birth: December 09, 1940  [facility] CM/SW Contact:  Izola Price, RN Phone Number: 01/06/2022, 11:44 AM   Clinical Narrative: 1/16: Discharge orders in today. CM requested [place] PT orders as active with [place] for recent hip replacement per ED RN CM note. Orders requested and received for [facility] PT today. [place] notified via secure message of discharge pending today. Simmie Davies RN CM       Final next level of care: [place] Barriers to Discharge: Barriers Resolved   Patient Goals and CMS Choice      Discharge Placement                         Discharge Plan and Services Additional resources added to the After Visit Summary for       Post Acute Care Choice: Resumption of Svcs/PTA Provider          DME Arranged: N/A DME Agency: NA       HH Arranged: PT (Orders placed today 01/06/21 on discharge. CW notified.) HH Agency: [place] Date HH Agency Contacted: 01/06/22 Time HH Agency Contacted: [phone] Representative spoke with at [place]: [place] today.  Social Determinants of Health (SDOH) Interventions SDOH Screenings   Food Insecurity: No Food Insecurity (01/04/2022)  Recent Concern: Food Insecurity - Food Insecurity Present (12/04/2021)  Housing: Low Risk  (01/04/2022)  Transportation Needs: No Transportation Needs (01/04/2022)  Utilities: Not At Risk (01/04/2022)  Depression (PHQ2-9): Low Risk  (12/22/2019)  Tobacco Use: Low Risk  (01/05/2022)     Readmission Risk Interventions     No data to display

## 2022-01-08 ENCOUNTER — Encounter: Payer: Self-pay | Admitting: Internal Medicine

## 2022-01-08 NOTE — Anesthesia Postprocedure Evaluation (Signed)
Anesthesia Post Note  Patient: Carlos Neal.  Procedure(s) Performed: ESOPHAGOGASTRODUODENOSCOPY (EGD) WITH PROPOFOL COLONOSCOPY  Patient location during evaluation: Endoscopy Anesthesia Type: General Level of consciousness: awake and alert Pain management: pain level controlled Vital Signs Assessment: post-procedure vital signs reviewed and stable Respiratory status: spontaneous breathing, nonlabored ventilation, respiratory function stable and patient connected to nasal cannula oxygen Cardiovascular status: blood pressure returned to baseline and stable Postop Assessment: no apparent nausea or vomiting Anesthetic complications: no   No notable events documented.   Last Vitals:  Vitals:   01/06/22 0431 01/06/22 0747  BP: (!) 114/51 124/71  Pulse: 65 78  Resp: 17 16  Temp: 36.7 C 36.6 C  SpO2: 98% 99%    Last Pain:  Vitals:   01/06/22 0745  TempSrc:   PainSc: 0-No pain                 Precious Haws Stevi [place]

## 2022-01-11 ENCOUNTER — Encounter: Payer: Self-pay | Admitting: Oncology

## 2022-01-11 ENCOUNTER — Inpatient Hospital Stay: Payer: Medicare PPO | Attending: Oncology | Admitting: Oncology

## 2022-01-11 ENCOUNTER — Inpatient Hospital Stay: Payer: Medicare PPO

## 2022-01-11 ENCOUNTER — Other Ambulatory Visit: Payer: Self-pay | Admitting: Oncology

## 2022-01-11 VITALS — BP 111/61 | HR 63 | Temp 96.8°F | Resp 18 | Wt 132.3 lb

## 2022-01-11 DIAGNOSIS — D500 Iron deficiency anemia secondary to blood loss (chronic): Secondary | ICD-10-CM

## 2022-01-11 DIAGNOSIS — Q2730 Arteriovenous malformation, site unspecified: Secondary | ICD-10-CM | POA: Insufficient documentation

## 2022-01-11 DIAGNOSIS — D509 Iron deficiency anemia, unspecified: Secondary | ICD-10-CM

## 2022-01-11 LAB — COMPREHENSIVE METABOLIC PANEL
ALT: 6 U/L (ref 0–44)
AST: 16 U/L (ref 15–41)
Albumin: 3.6 g/dL (ref 3.5–5.0)
Alkaline Phosphatase: 48 U/L (ref 38–126)
Anion gap: 6 (ref 5–15)
BUN: 30 mg/dL — ABNORMAL HIGH (ref 8–23)
CO2: 26 mmol/L (ref 22–32)
Calcium: 8.6 mg/dL — ABNORMAL LOW (ref 8.9–10.3)
Chloride: 103 mmol/L (ref 98–111)
Creatinine, Ser: 0.84 mg/dL (ref 0.61–1.24)
GFR, Estimated: >60 mL/min (ref >60)
Glucose, Bld: 72 mg/dL (ref 70–99)
Potassium: 3.5 mmol/L (ref 3.5–5.1)
Sodium: 135 mmol/L (ref 135–145)
Total Bilirubin: 0.5 mg/dL (ref 0.3–1.2)
Total Protein: 6 g/dL — ABNORMAL LOW (ref 6.5–8.1)

## 2022-01-11 LAB — CBC WITH DIFFERENTIAL/PLATELET
Abs Immature Granulocytes: 0.04 10*3/uL (ref 0.00–0.07)
Basophils Absolute: 0 10*3/uL (ref 0.0–0.1)
Basophils Relative: 0 %
Eosinophils Absolute: 0.3 10*3/uL (ref 0.0–0.5)
Eosinophils Relative: 4 %
HCT: 22.1 % — ABNORMAL LOW (ref 39.0–52.0)
Hemoglobin: 6.9 g/dL — ABNORMAL LOW (ref 13.0–17.0)
Immature Granulocytes: 1 %
Lymphocytes Relative: 27 %
Lymphs Abs: 1.9 10*3/uL (ref 0.7–4.0)
MCH: 25.3 pg — ABNORMAL LOW (ref 26.0–34.0)
MCHC: 31.2 g/dL (ref 30.0–36.0)
MCV: 81 fL (ref 80.0–100.0)
Monocytes Absolute: 0.7 10*3/uL (ref 0.1–1.0)
Monocytes Relative: 10 %
Neutro Abs: 4.3 10*3/uL (ref 1.7–7.7)
Neutrophils Relative %: 58 %
Platelets: 365 10*3/uL (ref 150–400)
RBC: 2.73 MIL/uL — ABNORMAL LOW (ref 4.22–5.81)
RDW: 17.7 % — ABNORMAL HIGH (ref 11.5–15.5)
WBC: 7.2 10*3/uL (ref 4.0–10.5)
nRBC: 0 % (ref 0.0–0.2)

## 2022-01-11 LAB — IRON AND TIBC
Iron: 17 ug/dL — ABNORMAL LOW (ref 45–182)
Saturation Ratios: 5 % — ABNORMAL LOW (ref 17.9–39.5)
TIBC: 321 ug/dL (ref 250–450)
UIBC: 304 ug/dL

## 2022-01-11 LAB — SAMPLE TO BLOOD BANK

## 2022-01-11 LAB — FERRITIN: Ferritin: 7 ng/mL — ABNORMAL LOW (ref 24–336)

## 2022-01-11 MED FILL — Iron Sucrose Inj 20 MG/ML (Fe Equiv): INTRAVENOUS | Qty: 10 | Status: AC

## 2022-01-11 NOTE — Assessment & Plan Note (Addendum)
Today's blood work showed hemoglobin further dropped to 6.9, iron panel is consistent with persistent iron deficiency, indicating ongoing GI blood loss. Recommend IV Venofer weekly x 4.  Since hemoglobin is less than 7, will also recommend 1 unit of blood transfusion ASAP.

## 2022-01-11 NOTE — Progress Notes (Signed)
Pt here for follow up. Pt reports that he has been having black stools for the past few days.

## 2022-01-11 NOTE — Assessment & Plan Note (Signed)
Follow-up with GI 

## 2022-01-11 NOTE — Assessment & Plan Note (Signed)
Self-reported history of hemochromatosis needed phlebotomy. I will check hemochromatosis mutation PCR>

## 2022-01-11 NOTE — Progress Notes (Signed)
Hematology/Oncology Consult note [phone] [phone]      Patient Care Team: Marisue Ivan, MD as PCP - General (Family Medicine)   REFERRING PROVIDER: Marisue Ivan, MD  CHIEF COMPLAINTS/REASON FOR VISIT:  Anemia  ASSESSMENT & PLAN:   IDA (iron deficiency anemia) Today's blood work showed hemoglobin further dropped to 6.9, iron panel is consistent with persistent iron deficiency, indicating ongoing GI blood loss. Recommend IV Venofer weekly x 4.  Since hemoglobin is less than 7, will also recommend 1 unit of blood transfusion ASAP.  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 I will check hemochromatosis mutation PCR>  Orders Placed This Encounter  Procedures   Hemochromatosis DNA-PCR(c282y,h63d)    Standing Status:   Future    Number of Occurrences:   1    Standing Expiration Date:   01/12/2023   CBC with Differential/Platelet    Standing Status:   Future    Number of Occurrences:   1    Standing Expiration Date:   01/12/2023   Ferritin    Standing Status:   Future    Number of Occurrences:   1    Standing Expiration Date:   07/12/2022   Iron and TIBC    Standing Status:   Future    Number of Occurrences:   1    Standing Expiration Date:   01/12/2023   Comprehensive metabolic panel    Standing Status:   Future    Number of Occurrences:   1    Standing Expiration Date:   01/12/2023   Hold Tube- Blood Bank    Standing Status:   Future    Number of Occurrences:   1    Standing Expiration Date:   01/12/2023    All questions were answered. The patient knows to call the clinic with any problems, questions or concerns.  Rickard Patience, MD, PhD [facility] Hematology Oncology 01/11/2022     HISTORY OF PRESENTING ILLNESS:  Carlos Neal. is a  82 y.o.  male with PMH listed below who was referred to me for anemia Reviewed patient's recent labs that was done.  He was found to have  abnormal CBC on 01/11/2022 with a hemoglobin of 7. Patient was recently admitted to the hospital due to acute drop of hemoglobin to 5.2, status post multiple units of PRBC transfusion.  01/03/2022, iron panel is consistent with severe iron deficiency.  He recently had hip surgery and was on Lovenox anticoagulation prophylaxis, in combination use of Celebrex.  Patient underwent upper endoscopy, colonoscopy and capsule study.  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  Patient was discharged on 01/06/2022 with a hemoglobin of 7.4. Patient reports feeling tired and fatigued. He had not noticed any recent bleeding such as epistaxis, hematuria or hematochezia.   Patient reports remote history of hereditary hemochromatosis, diagnosed with 30 years ago with a " iron level of 3000".  Patient underwent phlebotomy frequently at that point. Since he establish care with Dr. Burnadette Pop 11 years ago, he has not needed phlebotomy. Reviewed his previous blood work in [employer].  Patient has had decreased ferritin level dated back to at least 2015. Today she was accompanied by daughter.    MEDICAL HISTORY:  Past Medical History:  Diagnosis Date   Anemia    Asthma    as a child   GERD (gastroesophageal reflux disease)    Hypertension    Osteoarthritis    back and knee  Osteopenia    Osteoporosis    Vitamin D deficiency     SURGICAL HISTORY: Past Surgical History:  Procedure Laterality Date   BROW LIFT Bilateral 09/23/2020   Procedure: BLEPHAROPTOSIS REPAIR; RESECT EX  BROW PTOSIS REPAIR ECTROPION REPAIR, EXTENSIVE BILATERAL;  Surgeon: Imagene Riches, MD;  Location: [facility];  Service: Ophthalmology;  Laterality: Bilateral;  wants to be later in the schedule   COLONOSCOPY  01/12/2013   COLONOSCOPY N/A 01/05/2022   Procedure: COLONOSCOPY;  Surgeon: Toledo, Boykin Nearing, MD;  Location: [facility];  Service: Gastroenterology;  Laterality: N/A;   ESOPHAGOGASTRODUODENOSCOPY (EGD) WITH  PROPOFOL N/A 01/05/2022   Procedure: ESOPHAGOGASTRODUODENOSCOPY (EGD) WITH PROPOFOL;  Surgeon: Toledo, Boykin Nearing, MD;  Location: [facility];  Service: Gastroenterology;  Laterality: N/A;   GIVENS CAPSULE STUDY N/A 01/05/2022   Procedure: GIVENS CAPSULE STUDY;  Surgeon: Toledo, Boykin Nearing, MD;  Location: [facility];  Service: Gastroenterology;  Laterality: N/A;   HYDROCELE EXCISION     age 50   INGUINAL HERNIA REPAIR Bilateral    as a child   KNEE ARTHROPLASTY Right 01/30/2021   Procedure: COMPUTER ASSISTED TOTAL KNEE ARTHROPLASTY;  Surgeon: Donato Heinz, MD;  Location: [facility];  Service: Orthopedics;  Laterality: Right;   LUMBAR LAMINECTOMY/ DECOMPRESSION WITH MET-RX Right 01/15/2020   Procedure: L2-3 DECOMPRESSION, RIGHT L2-3 FAR LATERAL DISCECTOMY;  Surgeon: Venetia Night, MD;  Location: [facility];  Service: Neurosurgery;  Laterality: Right;  2nd case   TOTAL HIP ARTHROPLASTY Left 12/04/2021   Procedure: TOTAL HIP ARTHROPLASTY;  Surgeon: Donato Heinz, MD;  Location: [facility];  Service: Orthopedics;  Laterality: Left;    SOCIAL HISTORY: Social History   Socioeconomic History   Marital status: Married    Spouse name: Glenda   Number of children: 1   Years of education: Not on file   Highest education level: Not on file  Occupational History   Not on file  Tobacco Use   Smoking status: Never   Smokeless tobacco: Never  Vaping Use   Vaping Use: Never used  Substance and Sexual Activity   Alcohol use: Yes    Alcohol/week: 3.0 standard drinks of alcohol    Types: 3 Glasses of wine per week    Comment: occasional   Drug use: Never   Sexual activity: Not on file  Other Topics Concern   Not on file  Social History Narrative   Not on file   Social Determinants of Health   Financial Resource Strain: Not on file  Food Insecurity: No Food Insecurity (01/04/2022)   Hunger Vital Sign    Worried About Running Out of Food in the Last Year: Never true    Ran Out of Food in  the Last Year: Never true  Recent Concern: Food Insecurity - Food Insecurity Present (12/04/2021)   Hunger Vital Sign    Worried About Running Out of Food in the Last Year: Sometimes true    Ran Out of Food in the Last Year: Sometimes true  Transportation Needs: No Transportation Needs (01/04/2022)   PRAPARE - Administrator, Civil Service (Medical): No    Lack of Transportation (Non-Medical): No  Physical Activity: Not on file  Stress: Not on file  Social Connections: Not on file  Intimate Partner Violence: Not At Risk (01/04/2022)   Humiliation, Afraid, Rape, and Kick questionnaire    Fear of Current or Ex-Partner: No    Emotionally Abused: No    Physically Abused: No  Sexually Abused: No    FAMILY HISTORY: Family History  Problem Relation Age of Onset   COPD Mother    Blindness Father    Leukemia Maternal Uncle     ALLERGIES:  has No Known Allergies.  MEDICATIONS:  Current Outpatient Medications  Medication Sig Dispense Refill   acetaminophen (TYLENOL) 500 MG tablet Take 1,000 mg by mouth every 6 (six) hours as needed for moderate pain.     aspirin 325 MG tablet Take 325 mg by mouth daily.     brimonidine (ALPHAGAN P) 0.1 % SOLN Place 1 drop into both eyes in the morning and at bedtime.     CALCIUM PO Take 600 mg by mouth daily.     Camphor-Menthol-Methyl Sal (SALONPAS) 3.01-07-08 % PTCH Place 1 patch onto the skin daily as needed (pain).     Cholecalciferol (VITAMIN D3 PO) Take 1 tablet by mouth daily.     diclofenac Sodium (VOLTAREN) 1 % GEL Apply 1 application topically 4 (four) times daily as needed (pain).     hydrochlorothiazide (HYDRODIURIL) 25 MG tablet Take 25 mg by mouth daily. Takes 1/2 tab am     losartan (COZAAR) 25 MG tablet Take 25 mg by mouth at bedtime. Takes 2 tablets = 50 mg     Melatonin 10 MG CAPS Take 10 mg by mouth at bedtime as needed (sleep).     Multiple Vitamins-Minerals (PRESERVISION AREDS 2) CAPS Take 1 capsule by mouth daily.      RIBOFLAVIN PO Take 1 tablet by mouth daily.     tamsulosin (FLOMAX) 0.4 MG CAPS capsule Take 0.8 mg by mouth at bedtime.     traMADol (ULTRAM) 50 MG tablet Take 1 tablet (50 mg total) by mouth every 4 (four) hours as needed for moderate pain. 30 tablet 0   traZODone (DESYREL) 50 MG tablet Take 50 mg by mouth at bedtime as needed for sleep.     vitamin B-12 (CYANOCOBALAMIN) 100 MCG tablet Take 200 mcg by mouth daily.     Zoledronic Acid (RECLAST IV) Inject into the vein. Once per year     carboxymethylcellulose (REFRESH PLUS) 0.5 % SOLN Place 1 drop into both eyes daily as needed (dry eyes). (Patient not taking: Reported on 01/11/2022)     oxyCODONE (OXY IR/ROXICODONE) 5 MG immediate release tablet Take 1 tablet (5 mg total) by mouth every 4 (four) hours as needed for severe pain. (Patient not taking: Reported on 01/03/2022) 30 tablet 0   No current facility-administered medications for this visit.    Review of Systems  Constitutional:  Positive for fatigue. Negative for appetite change, chills, fever and unexpected weight change.  HENT:   Negative for hearing loss and voice change.   Eyes:  Negative for eye problems and icterus.  Respiratory:  Negative for chest tightness, cough and shortness of breath.   Cardiovascular:  Negative for chest pain and leg swelling.  Gastrointestinal:  Negative for abdominal distention and abdominal pain.  Endocrine: Negative for hot flashes.  Genitourinary:  Negative for difficulty urinating, dysuria and frequency.   Musculoskeletal:  Negative for arthralgias.  Skin:  Negative for itching and rash.  Neurological:  Negative for light-headedness and numbness.  Hematological:  Negative for adenopathy. Does not bruise/bleed easily.  Psychiatric/Behavioral:  Negative for confusion.     PHYSICAL EXAMINATION: ECOG PERFORMANCE STATUS: 1 - Symptomatic but completely ambulatory Vitals:   01/11/22 0951  BP: 111/61  Pulse: 63  Resp: 18  Temp: (!) 96.8 F (36 C)  Filed Weights   01/11/22 0951  Weight: 132 lb 4.8 oz (60 kg)    Physical Exam Constitutional:      General: He is not in acute distress. HENT:     Head: Normocephalic and atraumatic.  Eyes:     General: No scleral icterus. Cardiovascular:     Rate and Rhythm: Normal rate.  Pulmonary:     Effort: Pulmonary effort is normal. No respiratory distress.     Breath sounds: No wheezing.  Abdominal:     General: Bowel sounds are normal. There is no distension.     Palpations: Abdomen is soft.  Musculoskeletal:        General: No deformity. Normal range of motion.     Cervical back: Normal range of motion and neck supple.  Skin:    General: Skin is warm and dry.     Coloration: Skin is pale.     Findings: No erythema or rash.  Neurological:     Mental Status: He is alert and oriented to person, place, and time. Mental status is at baseline.     Cranial Nerves: No cranial nerve deficit.  Psychiatric:        Mood and Affect: Mood normal.      LABORATORY DATA:  I have reviewed the data as listed    Latest Ref Rng & Units 01/11/2022   10:25 AM 01/06/2022    4:45 AM 01/05/2022   12:03 PM  CBC  WBC 4.0 - 10.5 K/uL 7.2  6.3    Hemoglobin 13.0 - 17.0 g/dL 6.9  7.4  7.8   Hematocrit 39.0 - 52.0 % 22.1  23.6  24.9   Platelets 150 - 400 K/uL 365  302        Latest Ref Rng & Units 01/11/2022   10:25 AM 01/06/2022    4:45 AM 01/05/2022    4:26 AM  CMP  Glucose 70 - 99 mg/dL 72  93  99   BUN 8 - 23 mg/dL 30  13  12    Creatinine 0.61 - 1.24 mg/dL 0.84  0.80  0.80   Sodium 135 - 145 mmol/L 135  139  133   Potassium 3.5 - 5.1 mmol/L 3.5  3.7  3.6   Chloride 98 - 111 mmol/L 103  110  107   CO2 22 - 32 mmol/L 26  22  21    Calcium 8.9 - 10.3 mg/dL 8.6  8.5  8.0   Total Protein 6.5 - 8.1 g/dL 6.0     Total Bilirubin 0.3 - 1.2 mg/dL 0.5     Alkaline Phos 38 - 126 U/L 48     AST 15 - 41 U/L 16     ALT 0 - 44 U/L 6         Component Value Date/Time   IRON 17 (L) 01/11/2022 1025   TIBC  321 01/11/2022 1025   FERRITIN 7 (L) 01/11/2022 1025   FERRITIN 94 04/16/2011 1406   IRONPCTSAT 5 (L) 01/11/2022 1025     RADIOGRAPHIC STUDIES: I have personally reviewed the radiological images as listed and agreed with the findings in the report. US Venous Img Lower Bilateral (DVT)  Result Date: 01/03/2022 CLINICAL DATA:  Elevated D-dimer. EXAM: BILATERAL LOWER EXTREMITY VENOUS DOPPLER ULTRASOUND TECHNIQUE: Gray-scale sonography with graded compression, as well as color Doppler and duplex ultrasound were performed to evaluate the lower extremity deep venous systems from the level of the common femoral vein and including the common femoral, femoral,  profunda femoral, popliteal and calf veins including the posterior tibial, peroneal and gastrocnemius veins when visible. The superficial great saphenous vein was also interrogated. Spectral Doppler was utilized to evaluate flow at rest and with distal augmentation maneuvers in the common femoral, femoral and popliteal veins. COMPARISON:  None Available. FINDINGS: RIGHT LOWER EXTREMITY Common Femoral Vein: No evidence of thrombus. Normal compressibility, respiratory phasicity and response to augmentation. Saphenofemoral Junction: No evidence of thrombus. Normal compressibility and flow on color Doppler imaging. Profunda Femoral Vein: No evidence of thrombus. Normal compressibility and flow on color Doppler imaging. Femoral Vein: No evidence of thrombus. Normal compressibility, respiratory phasicity and response to augmentation. Popliteal Vein: No evidence of thrombus. Normal compressibility, respiratory phasicity and response to augmentation. Calf Veins: No evidence of thrombus. Normal compressibility and flow on color Doppler imaging. Superficial Great Saphenous Vein: No evidence of thrombus. Normal compressibility. Venous Reflux:  None. Other Findings:  None. LEFT LOWER EXTREMITY Common Femoral Vein: No evidence of thrombus. Normal compressibility,  respiratory phasicity and response to augmentation. Saphenofemoral Junction: No evidence of thrombus. Normal compressibility and flow on color Doppler imaging. Profunda Femoral Vein: No evidence of thrombus. Normal compressibility and flow on color Doppler imaging. Femoral Vein: No evidence of thrombus. Normal compressibility, respiratory phasicity and response to augmentation. Popliteal Vein: No evidence of thrombus. Normal compressibility, respiratory phasicity and response to augmentation. Calf Veins: No evidence of thrombus. Normal compressibility and flow on color Doppler imaging. Superficial Great Saphenous Vein: No evidence of thrombus. Normal compressibility. Venous Reflux:  None. Other Findings:  None. IMPRESSION: No evidence of deep venous thrombosis in either lower extremity. Electronically Signed   By: Virgina Norfolk M.D.   On: 01/03/2022 22:46   DG Chest Port 1 View  Result Date: 01/03/2022 CLINICAL DATA:  Shortness of breath EXAM: PORTABLE CHEST 1 VIEW COMPARISON:  None Available. FINDINGS: Transverse diameter of heart is increased. There is moderate sized fixed hiatal hernia. Lung fields are clear of any infiltrates or pulmonary edema. There is no pleural effusion or pneumothorax. Degenerative changes are noted in cervical spine. Degenerative changes are noted in both shoulders, more so in the left shoulder. IMPRESSION: There are no signs of pulmonary edema or focal pulmonary consolidation. Moderate sized fixed hiatal hernia. Electronically Signed   By: Elmer Picker M.D.   On: 01/03/2022 20:30

## 2022-01-12 ENCOUNTER — Ambulatory Visit
Admission: RE | Admit: 2022-01-12 | Discharge: 2022-01-12 | Disposition: A | Discharge status: Home / Self Care | Payer: Medicare PPO | Source: Ambulatory Visit | Attending: Oncology | Admitting: Oncology

## 2022-01-12 ENCOUNTER — Inpatient Hospital Stay: Payer: Medicare PPO

## 2022-01-12 ENCOUNTER — Telehealth: Payer: Self-pay | Admitting: *Deleted

## 2022-01-12 ENCOUNTER — Other Ambulatory Visit: Payer: Self-pay

## 2022-01-12 ENCOUNTER — Other Ambulatory Visit: Payer: Medicare PPO

## 2022-01-12 ENCOUNTER — Encounter: Payer: Medicare PPO | Admitting: Oncology

## 2022-01-12 VITALS — BP 120/54 | HR 71 | Temp 97.5°F | Resp 16

## 2022-01-12 DIAGNOSIS — D500 Iron deficiency anemia secondary to blood loss (chronic): Secondary | ICD-10-CM

## 2022-01-12 DIAGNOSIS — D509 Iron deficiency anemia, unspecified: Secondary | ICD-10-CM | POA: Diagnosis not present

## 2022-01-12 LAB — PREPARE RBC (CROSSMATCH)

## 2022-01-12 MED ORDER — SODIUM CHLORIDE 0.9% FLUSH: 10.0000 mL | INTRAVENOUS | Status: DC | PRN

## 2022-01-12 MED ORDER — SODIUM CHLORIDE 0.9 % IV SOLN
Freq: Once | INTRAVENOUS | Status: AC
  Filled 2022-01-12: qty 250

## 2022-01-12 MED ORDER — DIPHENHYDRAMINE HCL 25 MG PO CAPS
ORAL_CAPSULE | ORAL | Status: AC
  Administered 2022-01-12: 25 mg via ORAL
  Filled 2022-01-12: qty 1

## 2022-01-12 MED ORDER — ACETAMINOPHEN 325 MG PO TABS: 650.0000 mg | ORAL_TABLET | Freq: Once | ORAL | Status: AC

## 2022-01-12 MED ORDER — ACETAMINOPHEN 325 MG PO TABS
ORAL_TABLET | ORAL | Status: AC
  Administered 2022-01-12: 650 mg via ORAL
  Filled 2022-01-12: qty 2

## 2022-01-12 MED ORDER — SODIUM CHLORIDE 0.9% IV SOLUTION: 250.0000 mL | Freq: Once | INTRAVENOUS | Status: DC

## 2022-01-12 MED ORDER — SODIUM CHLORIDE 0.9% FLUSH: 3.0000 mL | INTRAVENOUS | Status: DC | PRN

## 2022-01-12 MED ORDER — HEPARIN SOD (PORK) LOCK FLUSH 100 UNIT/ML IV SOLN: 250.0000 [IU] | INTRAVENOUS | Status: DC | PRN

## 2022-01-12 MED ORDER — HEPARIN SOD (PORK) LOCK FLUSH 100 UNIT/ML IV SOLN: 500.0000 [IU] | Freq: Every day | INTRAVENOUS | Status: DC | PRN

## 2022-01-12 MED ORDER — DIPHENHYDRAMINE HCL 25 MG PO CAPS: 25.0000 mg | ORAL_CAPSULE | Freq: Once | ORAL | Status: AC

## 2022-01-12 MED ORDER — SODIUM CHLORIDE 0.9 % IV SOLN
200.0000 mg | Freq: Once | INTRAVENOUS | Status: AC
  Administered 2022-01-12: 200 mg via INTRAVENOUS
  Filled 2022-01-12: qty 200

## 2022-01-12 NOTE — Telephone Encounter (Signed)
Result note was sent yesterday afternoon so we have not had a chance to reach out to pt this morning yet due to clinic.  Pt came in to clinic today and received venofer.  Dr. Tasia Catchings would like for pt to get 1 unit of blood today, but per Sonia Baller clapp, [facility] "I believe they both cannot be given on the same day- I believe insurance will not cover them." Pt informed but he stated that he would like the blood tranfusion today and "will deal with insurance later, " his health is more important. Per Dr. Tasia Catchings, if pt is ok with that then ok to proceed with blood tranfusion today as well, since there is no medical interaction between venofer and blood.  Pt scheduled for blood transufsion today at [facility] after venofer infusion.

## 2022-01-12 NOTE — Telephone Encounter (Signed)
I called and spoke with patient, he is aware of the 10 AM appointment today and I told him his hgb is 6.9 and that we are working on setting up a blood transfusion for him and not to leave from his appointment today without stopping by the appointment desk to check on status of further appts. He agreed to stop at [employer]. Thank you [place].

## 2022-01-12 NOTE — Telephone Encounter (Signed)
Patient called wanting to know his hgb level. I see that Dr Tasia Catchings  put a result note in about this and the iron has been scheduled but not the transfusion and the patient apparently is not aware of this   sult Notes   Earlie Server, MD 01/11/2022  8:30 PM EST Back to Top    Hemoglobin is below 7, please arrange him to get 1 unit of PRBC transfusion ASAP.  Recommend Venofer weekly x 4. Follow-up in 8 weeks, labs prior to MD +/- Venofer.  Labs are ordered.  Blood orders in

## 2022-01-12 NOTE — Telephone Encounter (Signed)
Spoke to pt and informed him of MD recommendation and he verbalized understanding.   Please schedule and I will contact pt to inform him of appts:   Venofer weekly x3  lab next week -Day 1 (later in the week-same day as venofer) (cbc, HT) poss blood day 2 labs in 8 weeks  MD/ venofer 1-2 days after labs in 8 weeks

## 2022-01-13 LAB — TYPE AND SCREEN
ABO/RH(D): B POS
Antibody Screen: NEGATIVE
Unit division: 0

## 2022-01-13 LAB — BPAM RBC
Blood Product Expiration Date: 202401292359
ISSUE DATE / TIME: 202401121212
Unit Type and Rh: 7300

## 2022-01-15 ENCOUNTER — Ambulatory Visit: Payer: Medicare PPO

## 2022-01-15 ENCOUNTER — Inpatient Hospital Stay: Payer: Medicare PPO

## 2022-01-15 MED FILL — Iron Sucrose Inj 20 MG/ML (Fe Equiv): INTRAVENOUS | Qty: 10 | Status: AC

## 2022-01-16 ENCOUNTER — Inpatient Hospital Stay: Payer: Medicare PPO

## 2022-01-16 ENCOUNTER — Telehealth: Payer: Self-pay

## 2022-01-16 VITALS — BP 136/64 | HR 75 | Temp 98.9°F | Resp 16

## 2022-01-16 DIAGNOSIS — D509 Iron deficiency anemia, unspecified: Secondary | ICD-10-CM | POA: Diagnosis not present

## 2022-01-16 DIAGNOSIS — D500 Iron deficiency anemia secondary to blood loss (chronic): Secondary | ICD-10-CM

## 2022-01-16 LAB — CBC WITH DIFFERENTIAL/PLATELET
Abs Immature Granulocytes: 0.04 10*3/uL (ref 0.00–0.07)
Basophils Absolute: 0 10*3/uL (ref 0.0–0.1)
Basophils Relative: 0 %
Eosinophils Absolute: 0.4 10*3/uL (ref 0.0–0.5)
Eosinophils Relative: 6 %
HCT: 29.1 % — ABNORMAL LOW (ref 39.0–52.0)
Hemoglobin: 8.9 g/dL — ABNORMAL LOW (ref 13.0–17.0)
Immature Granulocytes: 1 %
Lymphocytes Relative: 20 %
Lymphs Abs: 1.5 10*3/uL (ref 0.7–4.0)
MCH: 26.2 pg (ref 26.0–34.0)
MCHC: 30.6 g/dL (ref 30.0–36.0)
MCV: 85.6 fL (ref 80.0–100.0)
Monocytes Absolute: 0.6 10*3/uL (ref 0.1–1.0)
Monocytes Relative: 8 %
Neutro Abs: 4.7 10*3/uL (ref 1.7–7.7)
Neutrophils Relative %: 65 %
Platelets: 441 10*3/uL — ABNORMAL HIGH (ref 150–400)
RBC: 3.4 MIL/uL — ABNORMAL LOW (ref 4.22–5.81)
RDW: 19.8 % — ABNORMAL HIGH (ref 11.5–15.5)
WBC: 7.2 10*3/uL (ref 4.0–10.5)
nRBC: 0 % (ref 0.0–0.2)

## 2022-01-16 LAB — SAMPLE TO BLOOD BANK

## 2022-01-16 MED ORDER — SODIUM CHLORIDE 0.9 % IV SOLN
Freq: Once | INTRAVENOUS | Status: AC
  Filled 2022-01-16: qty 250

## 2022-01-16 MED ORDER — SODIUM CHLORIDE 0.9 % IV SOLN
200.0000 mg | Freq: Once | INTRAVENOUS | Status: AC
  Administered 2022-01-16: 200 mg via INTRAVENOUS
  Filled 2022-01-16: qty 200

## 2022-01-16 NOTE — Telephone Encounter (Signed)
patient's hgb is 8.9.  Per Dr. Tasia Catchings, no blood needed on 1/17, will keep follow up appts as scheduled.

## 2022-01-17 ENCOUNTER — Inpatient Hospital Stay: Payer: Medicare PPO

## 2022-01-17 IMAGING — MR MR LUMBAR SPINE WO/W CM
5 of 7 series · 33 of 48 positions shown · IV contrast (gadavist)
Comparison: 10/20/2019

CLINICAL DATA: Low back pain with pain and burning in the right
leg.

EXAM:
MRI LUMBAR SPINE WITHOUT AND WITH CONTRAST
TECHNIQUE: Multiplanar and multiecho pulse sequences of the lumbar spine were
obtained without and with intravenous contrast.
CONTRAST:  6mL GADAVIST GADOBUTROL 1 MMOL/ML IV SOLN

[Series 5: T2 · sagittal · 4.0mm · 0.81mm/px · 6 of 18 slices shown (1 of 2)]
[im 1/18]
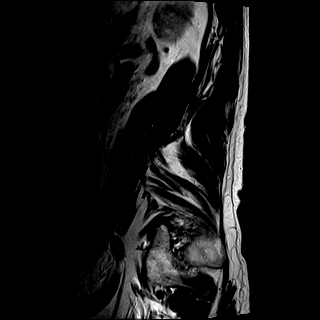
[im 4/18]
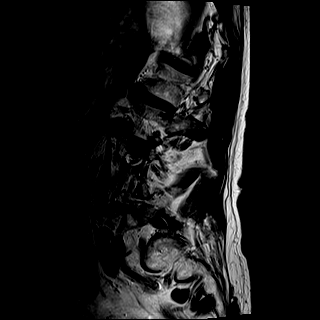
[im 7/18]
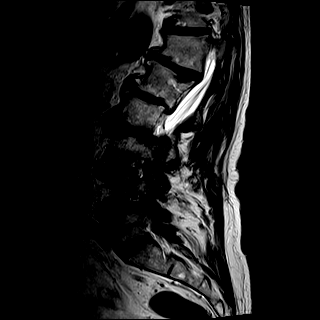
[im 11/18]
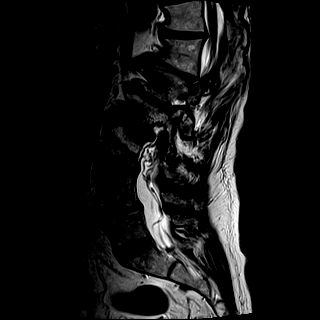
[im 14/18]
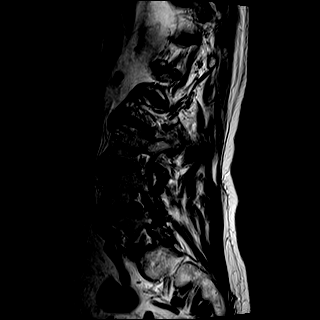
[im 18/18]
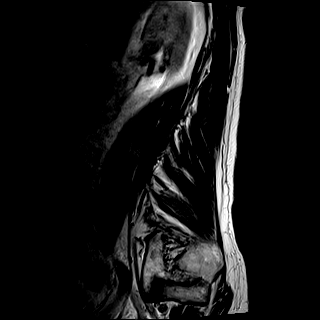

[Series 6: T1 · sagittal · 4.0mm · 0.81mm/px · 5 of 18 slices shown (1 of 2)]
[im 1/18]
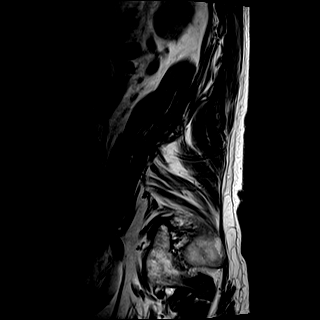
[im 5/18]
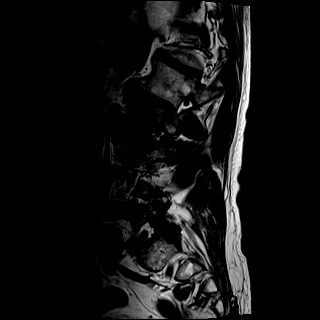
[im 9/18]
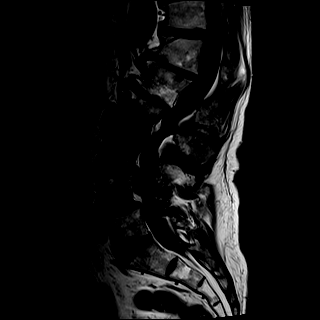
[im 13/18]
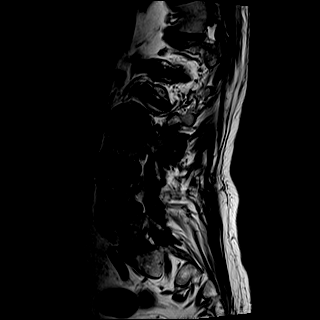
[im 18/18]
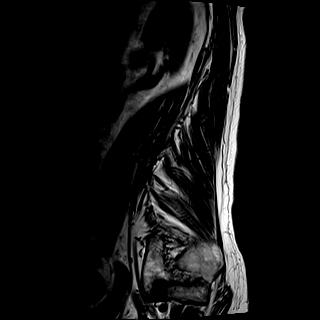

[Series 8: T2 · axial · 4.0mm · 0.78mm/px · z∈[-63,+130]mm · 9 of 33 slices shown (2 of 2)]
[im 1/33]
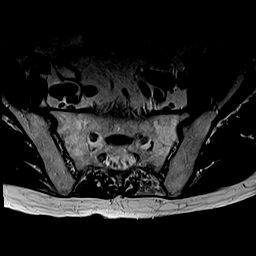
[im 5/33]
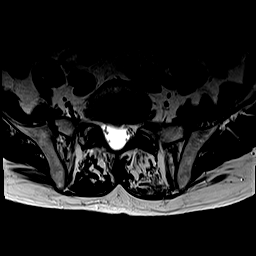
[im 9/33]
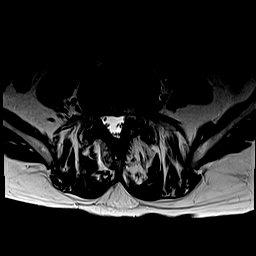
[im 13/33]
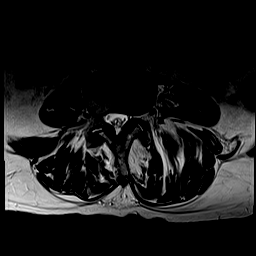
[im 17/33]
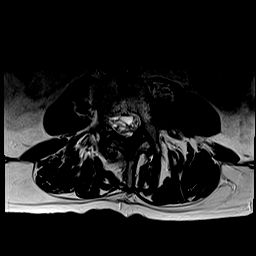
[im 21/33]
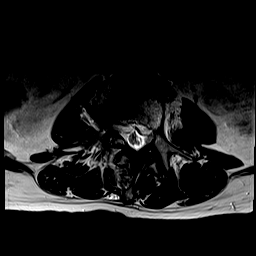
[im 25/33]
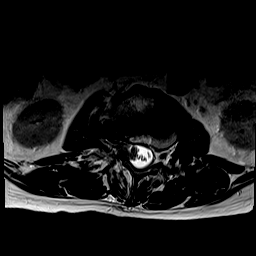
[im 29/33]
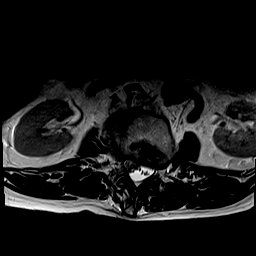
[im 33/33]
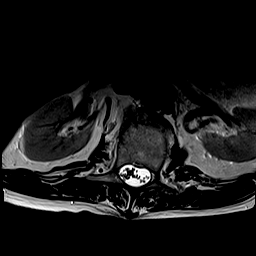

[Series 9: T1 · axial · 4.0mm · 0.39mm/px · z∈[-63,+130]mm · 9 of 33 slices shown (2 of 2)]
[im 1/33]
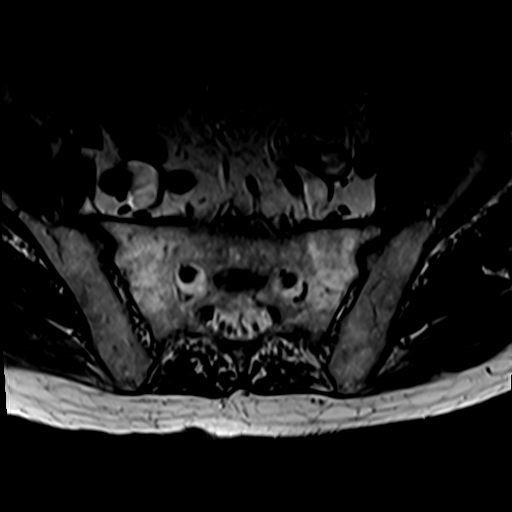
[im 5/33]
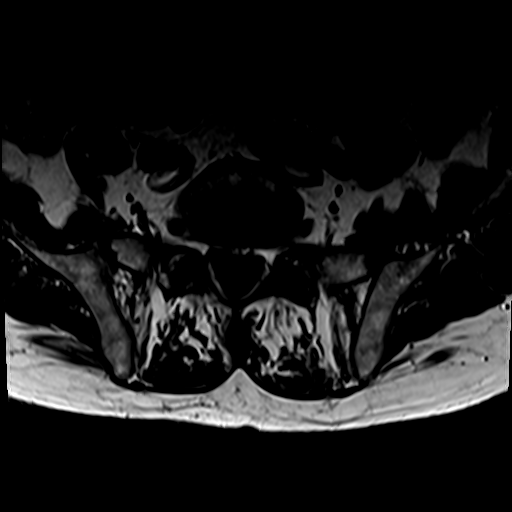
[im 9/33]
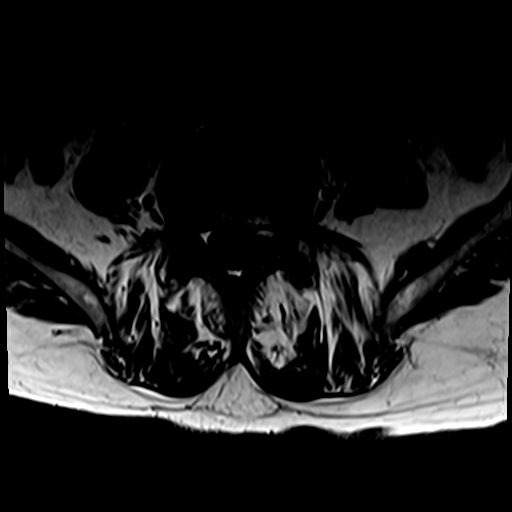
[im 13/33]
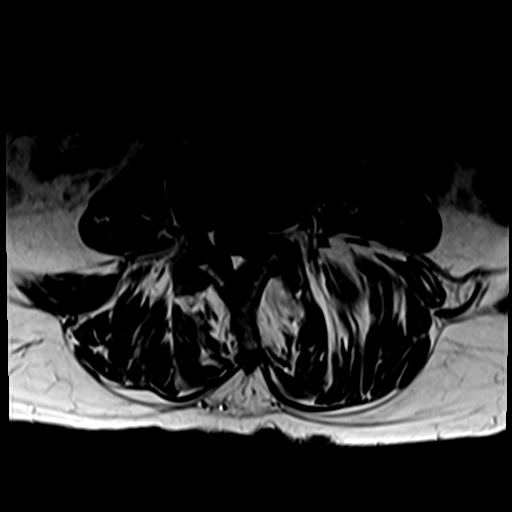
[im 17/33]
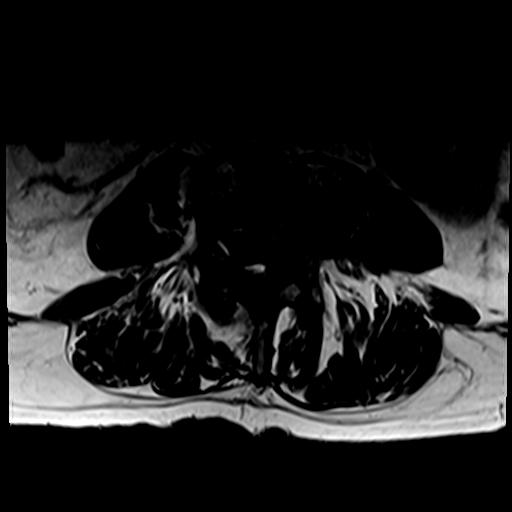
[im 21/33]
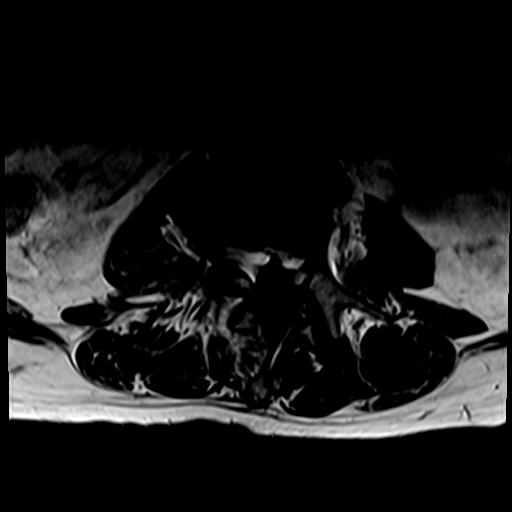
[im 25/33]
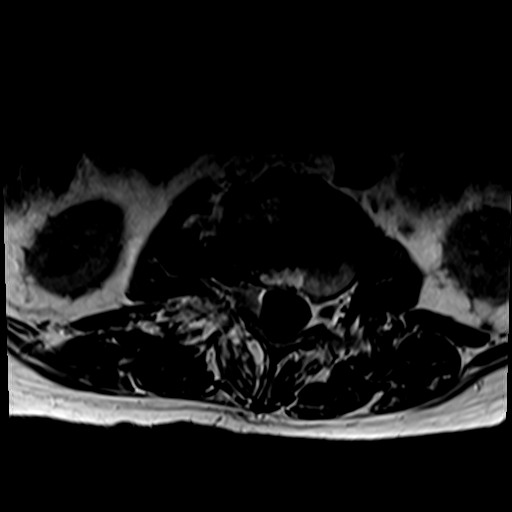
[im 29/33]
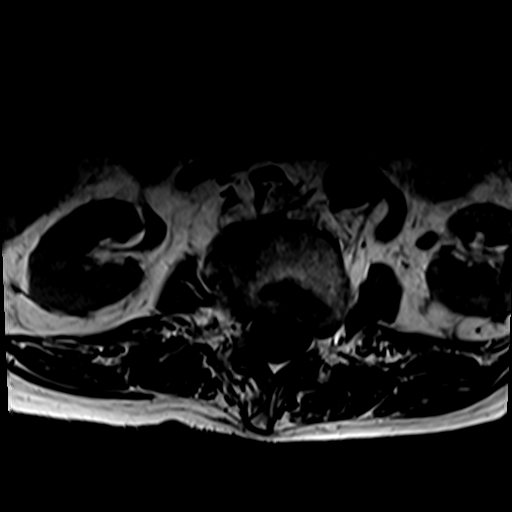
[im 33/33]
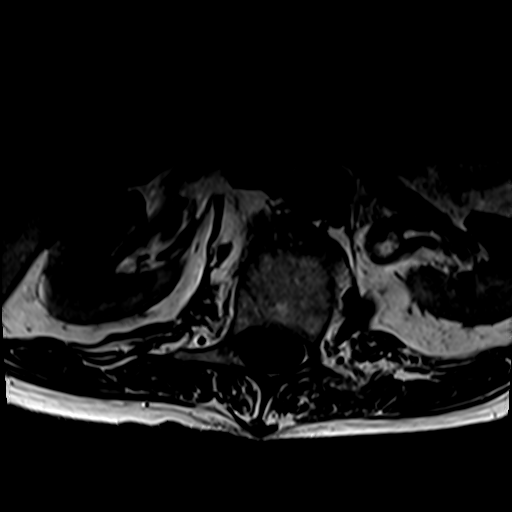

[Series 10: T1 fat-sat post-contrast · sagittal · 4.0mm · 0.81mm/px · 4 of 18 slices shown]
[im 1/18]
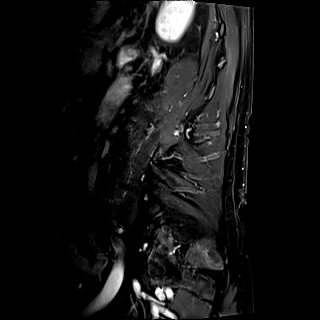
[im 5/18]
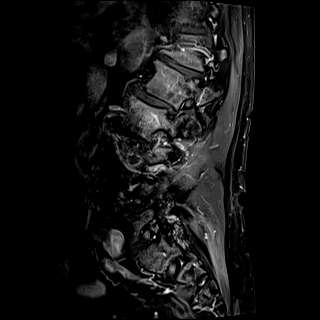
[im 9/18]
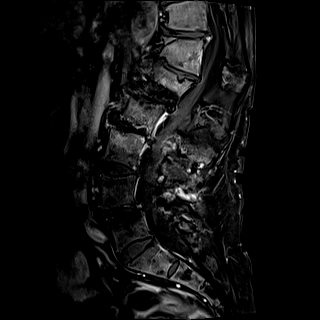
[im 13/18]
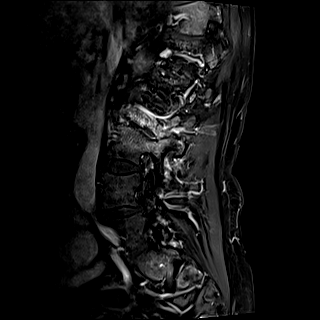

[33 of 48 positions shown; findings below may reference images not displayed]

FINDINGS: Segmentation:  Standard lumbar numbering

Alignment: Exaggerated lumbar lordosis with L4-5 anterolisthesis.
There is retrolisthesis at T12-L1 and L1-2 exaggerated kyphosis at
the thoracolumbar junction.

Vertebrae: Discogenic endplate edema at L2-3. no fracture or
suspected bone lesion.

Conus medullaris and cauda equina: Conus extends to the T12 level.
Conus appears normal. There is nerve root redundancy below L2-3 due
to the severity of spinal stenosis.

Paraspinal and other soft tissues: Left far-lateral to L3 is a 14 mm
cystic structure with internal debris like appearance,
new/progressive from before and contiguous with a left far-lateral
extrude.

Disc levels:

T12- L1: Anterior disc narrowing and spurring. Small central disc
protrusion. No neural compression.

L1-L2: Asymmetric rightward disc collapse with endplate spurring.
Asymmetric right facet spurring. Right foraminal stenosis is mild or
moderate.

L2-L3: Disc collapse and endplate degeneration with discogenic
edema. Facet osteoarthritis with asymmetric left-sided spurring.
Severe spinal stenosis. Biforaminal impingement and marked left
far-lateral impingement due to a extrusion with adjacent cyst that
is also mentioned above.

L3-L4: Disc narrowing and bulging. Facet osteoarthritis and
ligamentum flavum thickening with interspinous ganglion. No neural
compression.

L4-L5: Facet osteoarthritis with spurring and anterolisthesis. Disc
narrowing asymmetric to the left where there is greater bulging and
ridging. Moderate left foraminal stenosis. Patent canal

L5-S1:Facet osteoarthritis with moderate spurring. Negative disc
space. No neural impingement.
IMPRESSION: 1. Advanced disc and facet degeneration with exaggerated lumbar
lordosis and L4-5 anterolisthesis.
2. Dominant findings at L2-3 where there is advanced spinal stenosis
and biforaminal impingement. Also at this level is a left
far-lateral extrusion with adjacent 14 mm cyst that has
developed/progressed from comparison 10/20/2019, presumably disc.
Marrow signal abnormality at this level is attributed to discogenic
inflammation; no evident bone lesion.
3. L4-5 moderate left foraminal stenosis.

## 2022-01-19 ENCOUNTER — Inpatient Hospital Stay: Payer: Medicare PPO

## 2022-01-19 LAB — HEMOCHROMATOSIS DNA-PCR(C282Y,H63D)

## 2022-01-26 ENCOUNTER — Inpatient Hospital Stay: Payer: Medicare PPO

## 2022-01-26 ENCOUNTER — Telehealth: Payer: Self-pay

## 2022-01-26 VITALS — BP 140/67 | HR 78 | Temp 98.9°F | Resp 16

## 2022-01-26 DIAGNOSIS — D500 Iron deficiency anemia secondary to blood loss (chronic): Secondary | ICD-10-CM

## 2022-01-26 DIAGNOSIS — D509 Iron deficiency anemia, unspecified: Secondary | ICD-10-CM | POA: Diagnosis not present

## 2022-01-26 MED ORDER — SODIUM CHLORIDE 0.9 % IV SOLN
200.0000 mg | Freq: Once | INTRAVENOUS | Status: AC
  Administered 2022-01-26: 200 mg via INTRAVENOUS
  Filled 2022-01-26: qty 10

## 2022-01-26 MED ORDER — SODIUM CHLORIDE 0.9 % IV SOLN
Freq: Once | INTRAVENOUS | Status: AC
  Filled 2022-01-26: qty 250

## 2022-01-26 NOTE — Telephone Encounter (Signed)
Pt in clinic for iron infusion today. Pt requesting to have labs done sooner than March. Pt has been scheduled for labs on 2/13.   Please schedule MD/ venofer a few days after labs (2/13) Cancel appts on 3/15 and 3/20.

## 2022-02-02 ENCOUNTER — Inpatient Hospital Stay: Payer: Medicare PPO | Attending: Oncology

## 2022-02-02 VITALS — BP 140/73 | HR 86 | Temp 98.6°F | Resp 17

## 2022-02-02 DIAGNOSIS — K5520 Angiodysplasia of colon without hemorrhage: Secondary | ICD-10-CM | POA: Diagnosis not present

## 2022-02-02 DIAGNOSIS — D500 Iron deficiency anemia secondary to blood loss (chronic): Secondary | ICD-10-CM

## 2022-02-02 DIAGNOSIS — D509 Iron deficiency anemia, unspecified: Secondary | ICD-10-CM | POA: Insufficient documentation

## 2022-02-02 MED ORDER — SODIUM CHLORIDE 0.9 % IV SOLN
Freq: Once | INTRAVENOUS | Status: AC
  Filled 2022-02-02: qty 250

## 2022-02-02 MED ORDER — SODIUM CHLORIDE 0.9 % IV SOLN
200.0000 mg | Freq: Once | INTRAVENOUS | Status: AC
  Administered 2022-02-02: 200 mg via INTRAVENOUS
  Filled 2022-02-02: qty 200

## 2022-02-02 NOTE — Patient Instructions (Signed)
[facility]  Discharge Instructions: Thank you for choosing [facility] to provide your oncology and hematology care.  If you have a lab appointment with the Cancer Center, please go directly to the Cancer Center and check in at the registration area.  Wear comfortable clothing and clothing appropriate for easy access to any Portacath or PICC line.   We strive to give you quality time with your provider. You may need to reschedule your appointment if you arrive late (15 or more minutes).  Arriving late affects you and other patients whose appointments are after yours.  Also, if you miss three or more appointments without notifying the office, you may be dismissed from the clinic at the provider's discretion.      For prescription refill requests, have your pharmacy contact our office and allow 72 hours for refills to be completed.    Today you received the following chemotherapy and/or immunotherapy agents venofer      To help prevent nausea and vomiting after your treatment, we encourage you to take your nausea medication as directed.  BELOW ARE SYMPTOMS THAT SHOULD BE REPORTED IMMEDIATELY: *FEVER GREATER THAN 100.4 F (38 C) OR HIGHER *CHILLS OR SWEATING *NAUSEA AND VOMITING THAT IS NOT CONTROLLED WITH YOUR NAUSEA MEDICATION *UNUSUAL SHORTNESS OF BREATH *UNUSUAL BRUISING OR BLEEDING *URINARY PROBLEMS (pain or burning when urinating, or frequent urination) *BOWEL PROBLEMS (unusual diarrhea, constipation, pain near the anus) TENDERNESS IN MOUTH AND THROAT WITH OR WITHOUT PRESENCE OF ULCERS (sore throat, sores in mouth, or a toothache) UNUSUAL RASH, SWELLING OR PAIN  UNUSUAL VAGINAL DISCHARGE OR ITCHING   Items with * indicate a potential emergency and should be followed up as soon as possible or go to the Emergency Department if any problems should occur.  Please show the CHEMOTHERAPY ALERT CARD or IMMUNOTHERAPY ALERT CARD at check-in to  the Emergency Department and triage nurse.  Should you have questions after your visit or need to cancel or reschedule your appointment, please contact [facility]  [phone] and follow the prompts.  Office hours are 8:00 a.m. to 4:30 p.m. Monday - Friday. Please note that voicemails left after 4:00 p.m. may not be returned until the following business day.  We are closed weekends and major holidays. You have access to a nurse at all times for urgent questions. Please call the main number to the clinic [phone] and follow the prompts.  For any non-urgent questions, you may also contact your provider using MyChart. We now offer e-Visits for anyone 18 and older to request care online for non-urgent symptoms. For details visit [email or web address].   Also download the MyChart app! Go to the app store, search "MyChart", open the app, select [facility], and log in with your MyChart username and password.    

## 2022-02-12 ENCOUNTER — Other Ambulatory Visit: Payer: Self-pay | Admitting: *Deleted

## 2022-02-12 DIAGNOSIS — D500 Iron deficiency anemia secondary to blood loss (chronic): Secondary | ICD-10-CM

## 2022-02-13 ENCOUNTER — Ambulatory Visit: Payer: Medicare PPO

## 2022-02-13 ENCOUNTER — Ambulatory Visit: Payer: Medicare PPO | Admitting: Oncology

## 2022-02-13 ENCOUNTER — Inpatient Hospital Stay: Payer: Medicare PPO

## 2022-02-13 DIAGNOSIS — Q2730 Arteriovenous malformation, site unspecified: Secondary | ICD-10-CM

## 2022-02-13 DIAGNOSIS — D500 Iron deficiency anemia secondary to blood loss (chronic): Secondary | ICD-10-CM

## 2022-02-13 DIAGNOSIS — D509 Iron deficiency anemia, unspecified: Secondary | ICD-10-CM | POA: Diagnosis not present

## 2022-02-13 LAB — CBC WITH DIFFERENTIAL/PLATELET
Abs Immature Granulocytes: 0.02 10*3/uL (ref 0.00–0.07)
Basophils Absolute: 0.1 10*3/uL (ref 0.0–0.1)
Basophils Relative: 1 %
Eosinophils Absolute: 0.6 10*3/uL — ABNORMAL HIGH (ref 0.0–0.5)
Eosinophils Relative: 8 %
HCT: 34.8 % — ABNORMAL LOW (ref 39.0–52.0)
Hemoglobin: 10.9 g/dL — ABNORMAL LOW (ref 13.0–17.0)
Immature Granulocytes: 0 %
Lymphocytes Relative: 21 %
Lymphs Abs: 1.5 10*3/uL (ref 0.7–4.0)
MCH: 27.3 pg (ref 26.0–34.0)
MCHC: 31.3 g/dL (ref 30.0–36.0)
MCV: 87.2 fL (ref 80.0–100.0)
Monocytes Absolute: 0.6 10*3/uL (ref 0.1–1.0)
Monocytes Relative: 8 %
Neutro Abs: 4.4 10*3/uL (ref 1.7–7.7)
Neutrophils Relative %: 62 %
Platelets: 302 10*3/uL (ref 150–400)
RBC: 3.99 MIL/uL — ABNORMAL LOW (ref 4.22–5.81)
RDW: 21.2 % — ABNORMAL HIGH (ref 11.5–15.5)
WBC: 7.2 10*3/uL (ref 4.0–10.5)
nRBC: 0 % (ref 0.0–0.2)

## 2022-02-13 LAB — SAMPLE TO BLOOD BANK

## 2022-02-13 LAB — IRON AND TIBC
Iron: 39 ug/dL — ABNORMAL LOW (ref 45–182)
Saturation Ratios: 14 % — ABNORMAL LOW (ref 17.9–39.5)
TIBC: 280 ug/dL (ref 250–450)
UIBC: 241 ug/dL

## 2022-02-13 LAB — RETIC PANEL
Immature Retic Fract: 12.3 % (ref 2.3–15.9)
RBC.: 4.02 MIL/uL — ABNORMAL LOW (ref 4.22–5.81)
Retic Count, Absolute: 33.8 10*3/uL (ref 19.0–186.0)
Retic Ct Pct: 0.8 % (ref 0.4–3.1)
Reticulocyte Hemoglobin: 29.2 pg (ref >27.9)

## 2022-02-13 LAB — FERRITIN: Ferritin: 55 ng/mL (ref 24–336)

## 2022-02-15 ENCOUNTER — Encounter: Payer: Self-pay | Admitting: Oncology

## 2022-02-15 ENCOUNTER — Inpatient Hospital Stay: Payer: Medicare PPO

## 2022-02-15 ENCOUNTER — Inpatient Hospital Stay: Payer: Medicare PPO | Admitting: Oncology

## 2022-02-15 VITALS — BP 143/73 | HR 88 | Temp 98.1°F | Resp 18 | Wt 140.5 lb

## 2022-02-15 VITALS — BP 138/67 | HR 75 | Resp 18

## 2022-02-15 DIAGNOSIS — D509 Iron deficiency anemia, unspecified: Secondary | ICD-10-CM | POA: Diagnosis not present

## 2022-02-15 DIAGNOSIS — Q2730 Arteriovenous malformation, site unspecified: Secondary | ICD-10-CM | POA: Diagnosis not present

## 2022-02-15 DIAGNOSIS — D500 Iron deficiency anemia secondary to blood loss (chronic): Secondary | ICD-10-CM

## 2022-02-15 MED ORDER — SODIUM CHLORIDE 0.9 % IV SOLN
200.0000 mg | Freq: Once | INTRAVENOUS | Status: AC
  Administered 2022-02-15: 200 mg via INTRAVENOUS
  Filled 2022-02-15: qty 200

## 2022-02-15 MED ORDER — SODIUM CHLORIDE 0.9 % IV SOLN
Freq: Once | INTRAVENOUS | Status: AC
  Filled 2022-02-15: qty 250

## 2022-02-15 NOTE — Progress Notes (Signed)
Hematology/Oncology Progress note [phone] [phone]      CHIEF COMPLAINTS/REASON FOR VISIT:  Anemia  ASSESSMENT & PLAN:   IDA (iron deficiency anemia) Labs reviewed and discussed with patient Hemoglobin has improved to be above 10. Iron saturation 14, mildly decreased.  Ferritin 55 Recommend 1 more dose of Venofer 200 mg x 1 today. Recommend patient to follow-up in 3 months with repeat blood work   AVM (arteriovenous malformation) Follow up with GI.    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  Recommend avoid alcohol, vitamin C supplementation. Recommend first-degree relatives to be screened. Monitor iron panel.  Orders Placed This Encounter  Procedures   CBC with Differential ([place] Only)    Standing Status:   Future    Standing Expiration Date:   02/16/2023   Iron and TIBC    Standing Status:   Future    Standing Expiration Date:   02/16/2023   Ferritin    Standing Status:   Future    Standing Expiration Date:   02/16/2023    All questions were answered. The patient knows to call the clinic with any problems, questions or concerns.  Earlie Server, MD, PhD [facility] Hematology Oncology 02/15/2022     HISTORY OF PRESENTING ILLNESS:  Carlos Neal. is a  82 y.o.  male with PMH listed below who was referred to me for anemia Reviewed patient's recent labs that was done.  He was found to have abnormal CBC on 01/11/2022 with a hemoglobin of 7. Patient was recently admitted to the hospital due to acute drop of hemoglobin to 5.2, status post multiple units of PRBC transfusion.  01/03/2022, iron panel is consistent with severe iron deficiency.  He recently had hip surgery and was on Lovenox anticoagulation prophylaxis, in combination use of Celebrex.  Patient underwent upper endoscopy, colonoscopy and capsule study.  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  Patient was  discharged on 01/06/2022 with a hemoglobin of 7.4. Patient reports feeling tired and fatigued. He had not noticed any recent bleeding such as epistaxis, hematuria or hematochezia.   Patient reports remote history of hereditary hemochromatosis, diagnosed with 30 years ago with a " iron level of 3000".  Patient underwent phlebotomy frequently at that point. Since he establish care with Dr. Netty Starring 11 years ago, he has not needed phlebotomy. Reviewed his previous blood work in [employer].  Patient has had decreased ferritin level dated back to at least 2015.    INTERVAL HISTORY Carlos Neal. is a 82 y.o. male who has above history reviewed by me today presents for follow up visit for iron deficiency anemia. During the interval patient has had IV Venofer treatments.  He tolerates well. Denies any melena, hematuria, hematochezia.  Fatigue level has improved.  MEDICAL HISTORY:  Past Medical History:  Diagnosis Date   Anemia    Asthma    as a child   GERD (gastroesophageal reflux disease)    Hypertension    Osteoarthritis    back and knee   Osteopenia    Osteoporosis    Vitamin D deficiency     SURGICAL HISTORY: Past Surgical History:  Procedure Laterality Date   BROW LIFT Bilateral 09/23/2020   Procedure: BLEPHAROPTOSIS REPAIR; RESECT EX  BROW PTOSIS REPAIR ECTROPION REPAIR, EXTENSIVE BILATERAL;  Surgeon: Karle Starch, MD;  Location: [place];  Service: Ophthalmology;  Laterality: Bilateral;  wants to be later in the  schedule   COLONOSCOPY  01/12/2013   COLONOSCOPY N/A 01/05/2022   Procedure: COLONOSCOPY;  Surgeon: Toledo, Benay Pike, MD;  Location: [facility];  Service: Gastroenterology;  Laterality: N/A;   ESOPHAGOGASTRODUODENOSCOPY (EGD) WITH PROPOFOL N/A 01/05/2022   Procedure: ESOPHAGOGASTRODUODENOSCOPY (EGD) WITH PROPOFOL;  Surgeon: Toledo, Benay Pike, MD;  Location: [facility];  Service: Gastroenterology;  Laterality: N/A;   GIVENS CAPSULE STUDY N/A 01/05/2022    Procedure: GIVENS CAPSULE STUDY;  Surgeon: Toledo, Benay Pike, MD;  Location: [facility];  Service: Gastroenterology;  Laterality: N/A;   HYDROCELE EXCISION     age 27   INGUINAL HERNIA REPAIR Bilateral    as a child   KNEE ARTHROPLASTY Right 01/30/2021   Procedure: COMPUTER ASSISTED TOTAL KNEE ARTHROPLASTY;  Surgeon: Dereck Leep, MD;  Location: [facility];  Service: Orthopedics;  Laterality: Right;   LUMBAR LAMINECTOMY/ DECOMPRESSION WITH MET-RX Right 01/15/2020   Procedure: L2-3 DECOMPRESSION, RIGHT L2-3 FAR LATERAL DISCECTOMY;  Surgeon: Meade Maw, MD;  Location: [facility];  Service: Neurosurgery;  Laterality: Right;  2nd case   TOTAL HIP ARTHROPLASTY Left 12/04/2021   Procedure: TOTAL HIP ARTHROPLASTY;  Surgeon: Dereck Leep, MD;  Location: [facility];  Service: Orthopedics;  Laterality: Left;    SOCIAL HISTORY: Social History   Socioeconomic History   Marital status: Married    Spouse name: Glenda   Number of children: 1   Years of education: Not on file   Highest education level: Not on file  Occupational History   Not on file  Tobacco Use   Smoking status: Never   Smokeless tobacco: Never  Vaping Use   Vaping Use: Never used  Substance and Sexual Activity   Alcohol use: Yes    Alcohol/week: 3.0 standard drinks of alcohol    Types: 3 Glasses of wine per week    Comment: occasional   Drug use: Never   Sexual activity: Not on file  Other Topics Concern   Not on file  Social History Narrative   Not on file   Social Determinants of Health   Financial Resource Strain: Not on file  Food Insecurity: No Food Insecurity (01/04/2022)   Hunger Vital Sign    Worried About Running Out of Food in the Last Year: Never true    Ran Out of Food in the Last Year: Never true  Recent Concern: Food Insecurity - Food Insecurity Present (12/04/2021)   Hunger Vital Sign    Worried About Running Out of Food in the Last Year: Sometimes true    Ran Out of Food in the Last  Year: Sometimes true  Transportation Needs: No Transportation Needs (01/04/2022)   PRAPARE - Hydrologist (Medical): No    Lack of Transportation (Non-Medical): No  Physical Activity: Not on file  Stress: Not on file  Social Connections: Not on file  Intimate Partner Violence: Not At Risk (01/04/2022)   Humiliation, Afraid, Rape, and Kick questionnaire    Fear of Current or Ex-Partner: No    Emotionally Abused: No    Physically Abused: No    Sexually Abused: No    FAMILY HISTORY: Family History  Problem Relation Age of Onset   COPD Mother    Blindness Father    Leukemia Maternal Uncle     ALLERGIES:  has No Known Allergies.  MEDICATIONS:  Current Outpatient Medications  Medication Sig Dispense Refill   acetaminophen (TYLENOL) 500 MG tablet Take 1,000 mg by mouth every 6 (six) hours as  needed for moderate pain.     aspirin 325 MG tablet Take 325 mg by mouth daily.     brimonidine (ALPHAGAN P) 0.1 % SOLN Place 1 drop into both eyes in the morning and at bedtime.     CALCIUM PO Take 600 mg by mouth daily.     Camphor-Menthol-Methyl Sal (SALONPAS) 3.01-07-08 % PTCH Place 1 patch onto the skin daily as needed (pain).     carboxymethylcellulose (REFRESH PLUS) 0.5 % SOLN Place 1 drop into both eyes daily as needed (dry eyes).     Cholecalciferol (VITAMIN D3 PO) Take 1 tablet by mouth daily.     diclofenac Sodium (VOLTAREN) 1 % GEL Apply 1 application topically 4 (four) times daily as needed (pain).     hydrochlorothiazide (HYDRODIURIL) 25 MG tablet Take 25 mg by mouth daily. Takes 1/2 tab am     losartan (COZAAR) 25 MG tablet Take 25 mg by mouth at bedtime. Takes 2 tablets = 50 mg     Melatonin 10 MG CAPS Take 10 mg by mouth at bedtime as needed (sleep).     Multiple Vitamins-Minerals (PRESERVISION AREDS 2) CAPS Take 1 capsule by mouth daily.     RIBOFLAVIN PO Take 1 tablet by mouth daily.     tamsulosin (FLOMAX) 0.4 MG CAPS capsule Take 0.8 mg by mouth at  bedtime.     traZODone (DESYREL) 50 MG tablet Take 50 mg by mouth at bedtime as needed for sleep.     vitamin B-12 (CYANOCOBALAMIN) 100 MCG tablet Take 200 mcg by mouth daily.     Zoledronic Acid (RECLAST IV) Inject into the vein. Once per year     oxyCODONE (OXY IR/ROXICODONE) 5 MG immediate release tablet Take 1 tablet (5 mg total) by mouth every 4 (four) hours as needed for severe pain. (Patient not taking: Reported on 01/03/2022) 30 tablet 0   traMADol (ULTRAM) 50 MG tablet Take 1 tablet (50 mg total) by mouth every 4 (four) hours as needed for moderate pain. (Patient not taking: Reported on 02/15/2022) 30 tablet 0   No current facility-administered medications for this visit.    Review of Systems  Constitutional:  Positive for fatigue. Negative for appetite change, chills, fever and unexpected weight change.  HENT:   Negative for hearing loss and voice change.   Eyes:  Negative for eye problems and icterus.  Respiratory:  Negative for chest tightness, cough and shortness of breath.   Cardiovascular:  Negative for chest pain and leg swelling.  Gastrointestinal:  Negative for abdominal distention and abdominal pain.  Endocrine: Negative for hot flashes.  Genitourinary:  Negative for difficulty urinating, dysuria and frequency.   Musculoskeletal:  Negative for arthralgias.  Skin:  Negative for itching and rash.  Neurological:  Negative for light-headedness and numbness.  Hematological:  Negative for adenopathy. Does not bruise/bleed easily.  Psychiatric/Behavioral:  Negative for confusion.     PHYSICAL EXAMINATION: ECOG PERFORMANCE STATUS: 1 - Symptomatic but completely ambulatory Vitals:   02/15/22 1428  BP: (!) 143/73  Pulse: 88  Resp: 18  Temp: 98.1 F (36.7 C)   Filed Weights   02/15/22 1428  Weight: 140 lb 8 oz (63.7 kg)    Physical Exam Constitutional:      General: He is not in acute distress. HENT:     Head: Normocephalic and atraumatic.  Eyes:     General: No  scleral icterus. Cardiovascular:     Rate and Rhythm: Normal rate.  Pulmonary:  Effort: Pulmonary effort is normal. No respiratory distress.     Breath sounds: No wheezing.  Abdominal:     General: Bowel sounds are normal. There is no distension.     Palpations: Abdomen is soft.  Musculoskeletal:        General: No deformity. Normal range of motion.     Cervical back: Normal range of motion.  Skin:    General: Skin is warm.     Findings: No rash.  Neurological:     Mental Status: He is alert and oriented to person, place, and time. Mental status is at baseline.     Cranial Nerves: No cranial nerve deficit.  Psychiatric:        Mood and Affect: Mood normal.      LABORATORY DATA:  I have reviewed the data as listed    Latest Ref Rng & Units 02/13/2022   10:39 AM 01/16/2022    2:21 PM 01/11/2022   10:25 AM  CBC  WBC 4.0 - 10.5 K/uL 7.2  7.2  7.2   Hemoglobin 13.0 - 17.0 g/dL 10.9  8.9  6.9   Hematocrit 39.0 - 52.0 % 34.8  29.1  22.1   Platelets 150 - 400 K/uL 302  441  365       Latest Ref Rng & Units 01/11/2022   10:25 AM 01/06/2022    4:45 AM 01/05/2022    4:26 AM  CMP  Glucose 70 - 99 mg/dL 72  93  99   BUN 8 - 23 mg/dL 30  13  12   $ Creatinine 0.61 - 1.24 mg/dL 0.84  0.80  0.80   Sodium 135 - 145 mmol/L 135  139  133   Potassium 3.5 - 5.1 mmol/L 3.5  3.7  3.6   Chloride 98 - 111 mmol/L 103  110  107   CO2 22 - 32 mmol/L 26  22  21   $ Calcium 8.9 - 10.3 mg/dL 8.6  8.5  8.0   Total Protein 6.5 - 8.1 g/dL 6.0     Total Bilirubin 0.3 - 1.2 mg/dL 0.5     Alkaline Phos 38 - 126 U/L 48     AST 15 - 41 U/L 16     ALT 0 - 44 U/L 6         Component Value Date/Time   IRON 39 (L) 02/13/2022 1039   TIBC 280 02/13/2022 1039   FERRITIN 55 02/13/2022 1039   FERRITIN 94 04/16/2011 1406   IRONPCTSAT 14 (L) 02/13/2022 1039     RADIOGRAPHIC STUDIES: I have personally reviewed the radiological images as listed and agreed with the findings in the report. No results found.

## 2022-02-15 NOTE — Assessment & Plan Note (Addendum)
Labs reviewed and discussed with patient Hemoglobin has improved to be above 10. Iron saturation 14, mildly decreased.  Ferritin 55 Recommend 1 more dose of Venofer 200 mg x 1 today. Recommend patient to follow-up in 3 months with repeat blood work

## 2022-02-15 NOTE — Assessment & Plan Note (Signed)
Follow-up with GI 

## 2022-02-15 NOTE — Assessment & Plan Note (Signed)
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  Recommend avoid alcohol, vitamin C supplementation. Recommend first-degree relatives to be screened. Monitor iron panel.

## 2022-02-15 NOTE — Progress Notes (Signed)
Pt here for follow up. No new concerns voiced.   

## 2022-03-16 ENCOUNTER — Other Ambulatory Visit: Payer: Medicare PPO

## 2022-03-21 ENCOUNTER — Ambulatory Visit: Payer: Medicare PPO | Admitting: Oncology

## 2022-03-21 ENCOUNTER — Ambulatory Visit: Payer: Medicare PPO

## 2022-05-16 ENCOUNTER — Inpatient Hospital Stay: Payer: Medicare PPO | Attending: Oncology

## 2022-05-16 DIAGNOSIS — Q2730 Arteriovenous malformation, site unspecified: Secondary | ICD-10-CM | POA: Insufficient documentation

## 2022-05-16 DIAGNOSIS — D509 Iron deficiency anemia, unspecified: Secondary | ICD-10-CM | POA: Insufficient documentation

## 2022-05-16 DIAGNOSIS — D500 Iron deficiency anemia secondary to blood loss (chronic): Secondary | ICD-10-CM

## 2022-05-16 LAB — CBC WITH DIFFERENTIAL (CANCER CENTER ONLY)
Abs Immature Granulocytes: 0.01 10*3/uL (ref 0.00–0.07)
Basophils Absolute: 0 10*3/uL (ref 0.0–0.1)
Basophils Relative: 1 %
Eosinophils Absolute: 0.4 10*3/uL (ref 0.0–0.5)
Eosinophils Relative: 6 %
HCT: 38.9 % — ABNORMAL LOW (ref 39.0–52.0)
Hemoglobin: 12.7 g/dL — ABNORMAL LOW (ref 13.0–17.0)
Immature Granulocytes: 0 %
Lymphocytes Relative: 27 %
Lymphs Abs: 1.8 10*3/uL (ref 0.7–4.0)
MCH: 29.5 pg (ref 26.0–34.0)
MCHC: 32.6 g/dL (ref 30.0–36.0)
MCV: 90.5 fL (ref 80.0–100.0)
Monocytes Absolute: 0.6 10*3/uL (ref 0.1–1.0)
Monocytes Relative: 9 %
Neutro Abs: 3.8 10*3/uL (ref 1.7–7.7)
Neutrophils Relative %: 57 %
Platelet Count: 269 10*3/uL (ref 150–400)
RBC: 4.3 MIL/uL (ref 4.22–5.81)
RDW: 16 % — ABNORMAL HIGH (ref 11.5–15.5)
WBC Count: 6.7 10*3/uL (ref 4.0–10.5)
nRBC: 0 % (ref 0.0–0.2)

## 2022-05-16 LAB — FERRITIN: Ferritin: 9 ng/mL — ABNORMAL LOW (ref 24–336)

## 2022-05-16 LAB — IRON AND TIBC
Iron: 41 ug/dL — ABNORMAL LOW (ref 45–182)
Saturation Ratios: 14 % — ABNORMAL LOW (ref 17.9–39.5)
TIBC: 301 ug/dL (ref 250–450)
UIBC: 260 ug/dL

## 2022-05-21 ENCOUNTER — Inpatient Hospital Stay: Payer: Medicare PPO | Admitting: Oncology

## 2022-05-21 ENCOUNTER — Inpatient Hospital Stay: Payer: Medicare PPO

## 2022-05-21 ENCOUNTER — Encounter: Payer: Self-pay | Admitting: Oncology

## 2022-05-21 VITALS — BP 136/65 | HR 68 | Resp 18

## 2022-05-21 VITALS — BP 136/68 | HR 79 | Temp 97.0°F | Resp 18 | Wt 142.0 lb

## 2022-05-21 DIAGNOSIS — D500 Iron deficiency anemia secondary to blood loss (chronic): Secondary | ICD-10-CM | POA: Diagnosis not present

## 2022-05-21 DIAGNOSIS — Q2730 Arteriovenous malformation, site unspecified: Secondary | ICD-10-CM

## 2022-05-21 DIAGNOSIS — D509 Iron deficiency anemia, unspecified: Secondary | ICD-10-CM | POA: Diagnosis not present

## 2022-05-21 MED ORDER — SODIUM CHLORIDE 0.9 % IV SOLN
INTRAVENOUS | Status: DC
  Filled 2022-05-21: qty 250

## 2022-05-21 MED ORDER — SODIUM CHLORIDE 0.9 % IV SOLN
200.0000 mg | Freq: Once | INTRAVENOUS | Status: AC
  Administered 2022-05-21: 200 mg via INTRAVENOUS
  Filled 2022-05-21: qty 200

## 2022-05-21 NOTE — Progress Notes (Signed)
Pt has been educated and understands. Pt declined to stay 30 mins after iron infusion. VSS.  

## 2022-05-21 NOTE — Assessment & Plan Note (Addendum)
Labs reviewed and discussed with patient Lab Results  Component Value Date   HGB 12.7 (L) 05/16/2022   TIBC 301 05/16/2022   IRONPCTSAT 14 (L) 05/16/2022   FERRITIN 9 (L) 05/16/2022    Hemoglobin has improved. Persistent Iron deficient anemia.  Recommend Venofer 200 mg every 2 weeks x 3. 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  Recommend patient to follow-up in 3 months with repeat blood work. With his current hemoglobin, assuming no further bleeding, I think it is reasonable to repeat levels in 3 months.

## 2022-05-21 NOTE — Assessment & Plan Note (Signed)
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  Recommend avoid alcohol, vitamin C supplementation. Recommend first-degree relatives to be screened. Monitor iron panel. 

## 2022-05-21 NOTE — Assessment & Plan Note (Addendum)
I suspect that he may have chronic intermittent GI blood loss. He is persistently iron deficient despite multiple IV Venofer doses.  At least currently hemoglobin has improved with treatments.  GI recommendation was reviewed. Recommend observation.  Follow up with GI if he develops bleeding events or further drop of hemoglobin level despite IV iron treatments. Marland Kitchen

## 2022-05-21 NOTE — Progress Notes (Addendum)
Hematology/Oncology Progress note [phone] [phone]      CHIEF COMPLAINTS/REASON FOR VISIT:  Anemia  ASSESSMENT & PLAN:   IDA (iron deficiency anemia) Labs reviewed and discussed with patient Lab Results  Component Value Date   HGB 12.7 (L) 05/16/2022   TIBC 301 05/16/2022   IRONPCTSAT 14 (L) 05/16/2022   FERRITIN 9 (L) 05/16/2022    Hemoglobin has improved. Persistent Iron deficient anemia.  Recommend Venofer 200 mg every 2 weeks x 3. 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  Recommend patient to follow-up in 3 months with repeat blood work. With his current hemoglobin, assuming no further bleeding, I think it is reasonable to repeat levels in 3 months.    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  Recommend avoid alcohol, vitamin C supplementation. Recommend first-degree relatives to be screened. Monitor iron panel.  AVM (arteriovenous malformation) I suspect that he may have chronic intermittent GI blood loss. He is persistently iron deficient despite multiple IV Venofer doses.  At least currently hemoglobin has improved with treatments.  GI recommendation was reviewed. Recommend observation.  Follow up with GI if he develops bleeding events or further drop of hemoglobin level despite IV iron treatments. .    Orders Placed This Encounter  Procedures   CBC with Differential (Cancer Center Only)    Standing Status:   Future    Standing Expiration Date:   05/21/2023   Ferritin    Standing Status:   Future    Standing Expiration Date:   05/21/2023   Iron and TIBC    Standing Status:   Future    Standing Expiration Date:   05/21/2023    All questions were answered. The patient knows to call the clinic with any problems, questions or concerns.  Rickard Patience, MD, PhD [facility] Hematology Oncology 05/21/2022     HISTORY OF PRESENTING ILLNESS:  Carlos Leachman. is a  82 y.o.   male with PMH listed below who was referred to me for anemia Reviewed patient's recent labs that was done.  He was found to have abnormal CBC on 01/11/2022 with a hemoglobin of 7. Patient was recently admitted to the hospital due to acute drop of hemoglobin to 5.2, status post multiple units of PRBC transfusion.  01/03/2022, iron panel is consistent with severe iron deficiency.  He recently had hip surgery and was on Lovenox anticoagulation prophylaxis, in combination use of Celebrex.  Patient underwent upper endoscopy, colonoscopy and capsule study.  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  Patient was discharged on 01/06/2022 with a hemoglobin of 7.4. Patient reports feeling tired and fatigued. He had not noticed any recent bleeding such as epistaxis, hematuria or hematochezia.   Patient reports remote history of hereditary hemochromatosis, diagnosed with 30 years ago with a " iron level of 3000".  Patient underwent phlebotomy frequently at that point. Since he establish care with Dr. Burnadette Pop 11 years ago, he has not needed phlebotomy. Reviewed his previous blood work in [employer].  Patient has had decreased ferritin level dated back to at least 2015.    INTERVAL HISTORY Carlos Rennels. is a 82 y.o. male who has above history reviewed by me today presents for follow up visit for iron deficiency anemia. He tolerated IV Venofer treatments.  Denies any melena, hematuria, hematochezia.  Fatigue level has improved.  MEDICAL HISTORY:  Past Medical History:  Diagnosis Date   Anemia  Asthma    as a child   GERD (gastroesophageal reflux disease)    Hypertension    Osteoarthritis    back and knee   Osteopenia    Osteoporosis    Vitamin D deficiency     SURGICAL HISTORY: Past Surgical History:  Procedure Laterality Date   BROW LIFT Bilateral 09/23/2020   Procedure: BLEPHAROPTOSIS REPAIR; RESECT EX  BROW PTOSIS REPAIR ECTROPION REPAIR, EXTENSIVE BILATERAL;  Surgeon: Imagene Riches, MD;  Location: [facility];  Service: Ophthalmology;  Laterality: Bilateral;  wants to be later in the schedule   COLONOSCOPY  01/12/2013   COLONOSCOPY N/A 01/05/2022   Procedure: COLONOSCOPY;  Surgeon: Toledo, Boykin Nearing, MD;  Location: [facility];  Service: Gastroenterology;  Laterality: N/A;   ESOPHAGOGASTRODUODENOSCOPY (EGD) WITH PROPOFOL N/A 01/05/2022   Procedure: ESOPHAGOGASTRODUODENOSCOPY (EGD) WITH PROPOFOL;  Surgeon: Toledo, Boykin Nearing, MD;  Location: [facility];  Service: Gastroenterology;  Laterality: N/A;   GIVENS CAPSULE STUDY N/A 01/05/2022   Procedure: GIVENS CAPSULE STUDY;  Surgeon: Toledo, Boykin Nearing, MD;  Location: [facility];  Service: Gastroenterology;  Laterality: N/A;   HYDROCELE EXCISION     age 77   INGUINAL HERNIA REPAIR Bilateral    as a child   KNEE ARTHROPLASTY Right 01/30/2021   Procedure: COMPUTER ASSISTED TOTAL KNEE ARTHROPLASTY;  Surgeon: Donato Heinz, MD;  Location: [facility];  Service: Orthopedics;  Laterality: Right;   LUMBAR LAMINECTOMY/ DECOMPRESSION WITH MET-RX Right 01/15/2020   Procedure: L2-3 DECOMPRESSION, RIGHT L2-3 FAR LATERAL DISCECTOMY;  Surgeon: Venetia Night, MD;  Location: [facility];  Service: Neurosurgery;  Laterality: Right;  2nd case   TOTAL HIP ARTHROPLASTY Left 12/04/2021   Procedure: TOTAL HIP ARTHROPLASTY;  Surgeon: Donato Heinz, MD;  Location: [facility];  Service: Orthopedics;  Laterality: Left;    SOCIAL HISTORY: Social History   Socioeconomic History   Marital status: Married    Spouse name: Glenda   Number of children: 1   Years of education: Not on file   Highest education level: Not on file  Occupational History   Not on file  Tobacco Use   Smoking status: Never   Smokeless tobacco: Never  Vaping Use   Vaping Use: Never used  Substance and Sexual Activity   Alcohol use: Yes    Alcohol/week: 3.0 standard drinks of alcohol    Types: 3 Glasses of wine per week    Comment: occasional   Drug  use: Never   Sexual activity: Not on file  Other Topics Concern   Not on file  Social History Narrative   Not on file   Social Determinants of Health   Financial Resource Strain: Not on file  Food Insecurity: No Food Insecurity (01/04/2022)   Hunger Vital Sign    Worried About Running Out of Food in the Last Year: Never true    Ran Out of Food in the Last Year: Never true  Recent Concern: Food Insecurity - Food Insecurity Present (12/04/2021)   Hunger Vital Sign    Worried About Running Out of Food in the Last Year: Sometimes true    Ran Out of Food in the Last Year: Sometimes true  Transportation Needs: No Transportation Needs (01/04/2022)   PRAPARE - Administrator, Civil Service (Medical): No    Lack of Transportation (Non-Medical): No  Physical Activity: Not on file  Stress: Not on file  Social Connections: Not on file  Intimate Partner Violence: Not At Risk (01/04/2022)   Humiliation,  Afraid, Rape, and Kick questionnaire    Fear of Current or Ex-Partner: No    Emotionally Abused: No    Physically Abused: No    Sexually Abused: No    FAMILY HISTORY: Family History  Problem Relation Age of Onset   COPD Mother    Blindness Father    Leukemia Maternal Uncle     ALLERGIES:  has No Known Allergies.  MEDICATIONS:  Current Outpatient Medications  Medication Sig Dispense Refill   acetaminophen (TYLENOL) 500 MG tablet Take 1,000 mg by mouth every 6 (six) hours as needed for moderate pain.     brimonidine (ALPHAGAN P) 0.1 % SOLN Place 1 drop into both eyes in the morning and at bedtime.     CALCIUM PO Take 600 mg by mouth daily.     Camphor-Menthol-Methyl Sal (SALONPAS) 3.01-07-08 % PTCH Place 1 patch onto the skin daily as needed (pain).     carboxymethylcellulose (REFRESH PLUS) 0.5 % SOLN Place 1 drop into both eyes daily as needed (dry eyes).     Cholecalciferol (VITAMIN D3 PO) Take 1 tablet by mouth daily.     diclofenac Sodium (VOLTAREN) 1 % GEL Apply 1  application topically 4 (four) times daily as needed (pain).     hydrochlorothiazide (HYDRODIURIL) 25 MG tablet Take 25 mg by mouth daily. Takes 1/2 tab am     losartan (COZAAR) 25 MG tablet Take 25 mg by mouth at bedtime. Takes 2 tablets = 50 mg     Melatonin 10 MG CAPS Take 10 mg by mouth at bedtime as needed (sleep).     Multiple Vitamins-Minerals (PRESERVISION AREDS 2) CAPS Take 1 capsule by mouth daily.     tamsulosin (FLOMAX) 0.4 MG CAPS capsule Take 0.8 mg by mouth at bedtime.     traZODone (DESYREL) 50 MG tablet Take 50 mg by mouth at bedtime as needed for sleep.     vitamin B-12 (CYANOCOBALAMIN) 100 MCG tablet Take 200 mcg by mouth daily.     Zoledronic Acid (RECLAST IV) Inject into the vein. Once per year     aspirin 325 MG tablet Take 325 mg by mouth daily. (Patient not taking: Reported on 05/21/2022)     oxyCODONE (OXY IR/ROXICODONE) 5 MG immediate release tablet Take 1 tablet (5 mg total) by mouth every 4 (four) hours as needed for severe pain. (Patient not taking: Reported on 01/03/2022) 30 tablet 0   RIBOFLAVIN PO Take 1 tablet by mouth daily. (Patient not taking: Reported on 05/21/2022)     traMADol (ULTRAM) 50 MG tablet Take 1 tablet (50 mg total) by mouth every 4 (four) hours as needed for moderate pain. (Patient not taking: Reported on 02/15/2022) 30 tablet 0   No current facility-administered medications for this visit.   Facility-Administered Medications Ordered in Other Visits  Medication Dose Route Frequency Provider Last Rate Last Admin   0.9 %  sodium chloride infusion   Intravenous Continuous Rickard Patience, MD   Stopped at 05/21/22 1603    Review of Systems  Constitutional:  Positive for fatigue. Negative for appetite change, chills, fever and unexpected weight change.  HENT:   Negative for hearing loss and voice change.   Eyes:  Negative for eye problems and icterus.  Respiratory:  Negative for chest tightness, cough and shortness of breath.   Cardiovascular:  Negative for  chest pain and leg swelling.  Gastrointestinal:  Negative for abdominal distention and abdominal pain.  Endocrine: Negative for hot flashes.  Genitourinary:  Negative  for difficulty urinating, dysuria and frequency.   Musculoskeletal:  Negative for arthralgias.  Skin:  Negative for itching and rash.  Neurological:  Negative for light-headedness and numbness.  Hematological:  Negative for adenopathy. Does not bruise/bleed easily.  Psychiatric/Behavioral:  Negative for confusion.     PHYSICAL EXAMINATION: ECOG PERFORMANCE STATUS: 1 - Symptomatic but completely ambulatory Vitals:   05/21/22 1424  BP: 136/68  Pulse: 79  Resp: 18  Temp: (!) 97 F (36.1 C)  SpO2: 99%   Filed Weights   05/21/22 1424  Weight: 142 lb (64.4 kg)    Physical Exam Constitutional:      General: He is not in acute distress. HENT:     Head: Normocephalic and atraumatic.  Eyes:     General: No scleral icterus. Cardiovascular:     Rate and Rhythm: Normal rate.  Pulmonary:     Effort: Pulmonary effort is normal. No respiratory distress.     Breath sounds: No wheezing.  Abdominal:     General: Bowel sounds are normal. There is no distension.     Palpations: Abdomen is soft.  Musculoskeletal:        General: No deformity. Normal range of motion.     Cervical back: Normal range of motion.  Skin:    General: Skin is warm.     Findings: No rash.  Neurological:     Mental Status: He is alert and oriented to person, place, and time. Mental status is at baseline.     Cranial Nerves: No cranial nerve deficit.  Psychiatric:        Mood and Affect: Mood normal.      LABORATORY DATA:  I have reviewed the data as listed    Latest Ref Rng & Units 05/16/2022   12:56 PM 02/13/2022   10:39 AM 01/16/2022    2:21 PM  CBC  WBC 4.0 - 10.5 K/uL 6.7  7.2  7.2   Hemoglobin 13.0 - 17.0 g/dL 91.4  78.2  8.9   Hematocrit 39.0 - 52.0 % 38.9  34.8  29.1   Platelets 150 - 400 K/uL 269  302  441       Latest Ref  Rng & Units 01/11/2022   10:25 AM 01/06/2022    4:45 AM 01/05/2022    4:26 AM  CMP  Glucose 70 - 99 mg/dL 72  93  99   BUN 8 - 23 mg/dL 30  13  12    Creatinine 0.61 - 1.24 mg/dL 9.56  2.13  0.86   Sodium 135 - 145 mmol/L 135  139  133   Potassium 3.5 - 5.1 mmol/L 3.5  3.7  3.6   Chloride 98 - 111 mmol/L 103  110  107   CO2 22 - 32 mmol/L 26  22  21    Calcium 8.9 - 10.3 mg/dL 8.6  8.5  8.0   Total Protein 6.5 - 8.1 g/dL 6.0     Total Bilirubin 0.3 - 1.2 mg/dL 0.5     Alkaline Phos 38 - 126 U/L 48     AST 15 - 41 U/L 16     ALT 0 - 44 U/L 6         Component Value Date/Time   IRON 41 (L) 05/16/2022 1256   TIBC 301 05/16/2022 1256   FERRITIN 9 (L) 05/16/2022 1256   FERRITIN 94 04/16/2011 1406   IRONPCTSAT 14 (L) 05/16/2022 1256     RADIOGRAPHIC STUDIES: I have personally reviewed the radiological  images as listed and agreed with the findings in the report. No results found.

## 2022-06-04 ENCOUNTER — Inpatient Hospital Stay: Payer: Medicare PPO | Attending: Oncology

## 2022-06-04 VITALS — BP 146/61 | HR 59 | Temp 98.2°F | Resp 16

## 2022-06-04 DIAGNOSIS — D509 Iron deficiency anemia, unspecified: Secondary | ICD-10-CM | POA: Diagnosis present

## 2022-06-04 DIAGNOSIS — D500 Iron deficiency anemia secondary to blood loss (chronic): Secondary | ICD-10-CM

## 2022-06-04 MED ORDER — SODIUM CHLORIDE 0.9 % IV SOLN
INTRAVENOUS | Status: DC
  Filled 2022-06-04: qty 250

## 2022-06-04 MED ORDER — SODIUM CHLORIDE 0.9 % IV SOLN
200.0000 mg | Freq: Once | INTRAVENOUS | Status: AC
  Administered 2022-06-04: 200 mg via INTRAVENOUS
  Filled 2022-06-04: qty 200

## 2022-06-18 ENCOUNTER — Inpatient Hospital Stay: Payer: Medicare PPO

## 2022-06-18 VITALS — BP 124/68 | HR 78 | Resp 18

## 2022-06-18 DIAGNOSIS — D500 Iron deficiency anemia secondary to blood loss (chronic): Secondary | ICD-10-CM

## 2022-06-18 DIAGNOSIS — D509 Iron deficiency anemia, unspecified: Secondary | ICD-10-CM | POA: Diagnosis not present

## 2022-06-18 MED ORDER — SODIUM CHLORIDE 0.9 % IV SOLN
200.0000 mg | Freq: Once | INTRAVENOUS | Status: AC
  Administered 2022-06-18: 200 mg via INTRAVENOUS
  Filled 2022-06-18: qty 200

## 2022-06-18 MED ORDER — SODIUM CHLORIDE 0.9 % IV SOLN
Freq: Once | INTRAVENOUS | Status: AC
  Filled 2022-06-18: qty 250

## 2022-08-21 ENCOUNTER — Inpatient Hospital Stay: Payer: Medicare PPO | Attending: Oncology

## 2022-08-21 DIAGNOSIS — D500 Iron deficiency anemia secondary to blood loss (chronic): Secondary | ICD-10-CM

## 2022-08-21 LAB — CBC WITH DIFFERENTIAL (CANCER CENTER ONLY)
Abs Immature Granulocytes: 0.01 10*3/uL (ref 0.00–0.07)
Basophils Absolute: 0 10*3/uL (ref 0.0–0.1)
Basophils Relative: 0 %
Eosinophils Absolute: 0.3 10*3/uL (ref 0.0–0.5)
Eosinophils Relative: 4 %
HCT: 42.2 % (ref 39.0–52.0)
Hemoglobin: 14.3 g/dL (ref 13.0–17.0)
Immature Granulocytes: 0 %
Lymphocytes Relative: 18 %
Lymphs Abs: 1.3 10*3/uL (ref 0.7–4.0)
MCH: 32.6 pg (ref 26.0–34.0)
MCHC: 33.9 g/dL (ref 30.0–36.0)
MCV: 96.3 fL (ref 80.0–100.0)
Monocytes Absolute: 0.9 10*3/uL (ref 0.1–1.0)
Monocytes Relative: 12 %
Neutro Abs: 4.8 10*3/uL (ref 1.7–7.7)
Neutrophils Relative %: 66 %
Platelet Count: 214 10*3/uL (ref 150–400)
RBC: 4.38 MIL/uL (ref 4.22–5.81)
RDW: 13.8 % (ref 11.5–15.5)
WBC Count: 7.3 10*3/uL (ref 4.0–10.5)
nRBC: 0 % (ref 0.0–0.2)

## 2022-08-21 LAB — FERRITIN: Ferritin: 27 ng/mL (ref 24–336)

## 2022-08-21 LAB — IRON AND TIBC
Iron: 48 ug/dL (ref 45–182)
Saturation Ratios: 18 % (ref 17.9–39.5)
TIBC: 260 ug/dL (ref 250–450)
UIBC: 212 ug/dL

## 2022-08-22 ENCOUNTER — Inpatient Hospital Stay (HOSPITAL_BASED_OUTPATIENT_CLINIC_OR_DEPARTMENT_OTHER): Payer: Medicare PPO | Admitting: Oncology

## 2022-08-22 ENCOUNTER — Inpatient Hospital Stay: Payer: Medicare PPO

## 2022-08-22 ENCOUNTER — Encounter: Payer: Self-pay | Admitting: Oncology

## 2022-08-22 VITALS — BP 139/84 | HR 66 | Temp 98.5°F | Resp 18 | Wt 142.5 lb

## 2022-08-22 DIAGNOSIS — D500 Iron deficiency anemia secondary to blood loss (chronic): Secondary | ICD-10-CM

## 2022-08-22 DIAGNOSIS — Q2730 Arteriovenous malformation, site unspecified: Secondary | ICD-10-CM | POA: Diagnosis not present

## 2022-08-22 NOTE — Progress Notes (Signed)
Hematology/Oncology Progress note [phone] [phone]      CHIEF COMPLAINTS/REASON FOR VISIT:  Anemia  ASSESSMENT & PLAN:   IDA (iron deficiency anemia) Labs reviewed and discussed with patient Lab Results  Component Value Date   HGB 14.3 08/21/2022   TIBC 260 08/21/2022   IRONPCTSAT 18 08/21/2022   FERRITIN 27 08/21/2022    Hemoglobin has improved. Iron panel has improved.  Hold off additional IV venofer.   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 Monitor iron panel, currently no evidence of iron over load.  Ok with multivitamin containing iron.   AVM (arteriovenous malformation) I suspect that he may have chronic intermittent GI blood loss.  At least currently hemoglobin has improved with treatments.  GI recommendation was reviewed. Recommend observation.  Follow up with GI if he develops bleeding events or further drop of hemoglobin level despite IV iron treatments. .    Orders Placed This Encounter  Procedures   CBC with Differential (Cancer Center Only)    Standing Status:   Future    Standing Expiration Date:   08/22/2023   Iron and TIBC    Standing Status:   Future    Standing Expiration Date:   08/22/2023   Ferritin    Standing Status:   Future    Standing Expiration Date:   08/22/2023   Follow up in 6 months. All questions were answered. The patient knows to call the clinic with any problems, questions or concerns.  Rickard Patience, MD, PhD [facility] Hematology Oncology 08/22/2022     HISTORY OF PRESENTING ILLNESS:  Carlos Tinklenberg. is a  82 y.o.  male with PMH listed below who was referred to me for anemia Reviewed patient's recent labs that was done.  He was found to have abnormal CBC on 01/11/2022 with a hemoglobin of 7. Patient was recently admitted to the hospital due to acute drop of hemoglobin to 5.2, status post multiple units of PRBC transfusion.   01/03/2022, iron panel is consistent with severe iron deficiency.  He recently had hip surgery and was on Lovenox anticoagulation prophylaxis, in combination use of Celebrex.  Patient underwent upper endoscopy, colonoscopy and capsule study.  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  Patient was discharged on 01/06/2022 with a hemoglobin of 7.4. Patient reports feeling tired and fatigued. He had not noticed any recent bleeding such as epistaxis, hematuria or hematochezia.   Patient reports remote history of hereditary hemochromatosis, diagnosed with 30 years ago with a " iron level of 3000".  Patient underwent phlebotomy frequently at that point. Since he establish care with Dr. Burnadette Pop 11 years ago, he has not needed phlebotomy. Reviewed his previous blood work in [employer].  Patient has had decreased ferritin level dated back to at least 2015.    INTERVAL HISTORY Carlos Kowitz. is a 82 y.o. male who has above history reviewed by me today presents for follow up visit for iron deficiency anemia. He tolerated IV Venofer treatments.  Denies any melena, hematuria, hematochezia.  Fatigue level has improved.  MEDICAL HISTORY:  Past Medical History:  Diagnosis Date   Anemia    Asthma    as a child   GERD (gastroesophageal reflux disease)    Hypertension    Osteoarthritis    back and knee   Osteopenia    Osteoporosis    Vitamin D deficiency     SURGICAL HISTORY: Past Surgical History:  Procedure Laterality Date   BROW LIFT Bilateral 09/23/2020   Procedure: BLEPHAROPTOSIS REPAIR; RESECT EX  BROW PTOSIS REPAIR ECTROPION REPAIR, EXTENSIVE BILATERAL;  Surgeon: Imagene Riches, MD;  Location: [facility];  Service: Ophthalmology;  Laterality: Bilateral;  wants to be later in the schedule   COLONOSCOPY  01/12/2013   COLONOSCOPY N/A 01/05/2022   Procedure: COLONOSCOPY;  Surgeon: Toledo, Boykin Nearing, MD;  Location: [facility];  Service: Gastroenterology;  Laterality: N/A;    ESOPHAGOGASTRODUODENOSCOPY (EGD) WITH PROPOFOL N/A 01/05/2022   Procedure: ESOPHAGOGASTRODUODENOSCOPY (EGD) WITH PROPOFOL;  Surgeon: Toledo, Boykin Nearing, MD;  Location: [facility];  Service: Gastroenterology;  Laterality: N/A;   GIVENS CAPSULE STUDY N/A 01/05/2022   Procedure: GIVENS CAPSULE STUDY;  Surgeon: Toledo, Boykin Nearing, MD;  Location: [facility];  Service: Gastroenterology;  Laterality: N/A;   HYDROCELE EXCISION     age 68   INGUINAL HERNIA REPAIR Bilateral    as a child   KNEE ARTHROPLASTY Right 01/30/2021   Procedure: COMPUTER ASSISTED TOTAL KNEE ARTHROPLASTY;  Surgeon: Donato Heinz, MD;  Location: [facility];  Service: Orthopedics;  Laterality: Right;   LUMBAR LAMINECTOMY/ DECOMPRESSION WITH MET-RX Right 01/15/2020   Procedure: L2-3 DECOMPRESSION, RIGHT L2-3 FAR LATERAL DISCECTOMY;  Surgeon: Venetia Night, MD;  Location: [facility];  Service: Neurosurgery;  Laterality: Right;  2nd case   TOTAL HIP ARTHROPLASTY Left 12/04/2021   Procedure: TOTAL HIP ARTHROPLASTY;  Surgeon: Donato Heinz, MD;  Location: [facility];  Service: Orthopedics;  Laterality: Left;    SOCIAL HISTORY: Social History   Socioeconomic History   Marital status: Married    Spouse name: Glenda   Number of children: 1   Years of education: Not on file   Highest education level: Not on file  Occupational History   Not on file  Tobacco Use   Smoking status: Never   Smokeless tobacco: Never  Vaping Use   Vaping status: Never Used  Substance and Sexual Activity   Alcohol use: Yes    Alcohol/week: 3.0 standard drinks of alcohol    Types: 3 Glasses of wine per week    Comment: occasional   Drug use: Never   Sexual activity: Not on file  Other Topics Concern   Not on file  Social History Narrative   Not on file   Social Determinants of Health   Financial Resource Strain: Not on file  Food Insecurity: No Food Insecurity (01/04/2022)   Hunger Vital Sign    Worried About Running Out of Food in the  Last Year: Never true    Ran Out of Food in the Last Year: Never true  Recent Concern: Food Insecurity - Food Insecurity Present (12/04/2021)   Hunger Vital Sign    Worried About Running Out of Food in the Last Year: Sometimes true    Ran Out of Food in the Last Year: Sometimes true  Transportation Needs: No Transportation Needs (01/04/2022)   PRAPARE - Administrator, Civil Service (Medical): No    Lack of Transportation (Non-Medical): No  Physical Activity: Not on file  Stress: Not on file  Social Connections: Not on file  Intimate Partner Violence: Not At Risk (01/04/2022)   Humiliation, Afraid, Rape, and Kick questionnaire    Fear of Current or Ex-Partner: No    Emotionally Abused: No    Physically Abused: No    Sexually Abused: No    FAMILY HISTORY: Family History  Problem Relation Age of Onset   COPD Mother  Blindness Father    Leukemia Maternal Uncle     ALLERGIES:  has No Known Allergies.  MEDICATIONS:  Current Outpatient Medications  Medication Sig Dispense Refill   acetaminophen (TYLENOL) 500 MG tablet Take 1,000 mg by mouth every 6 (six) hours as needed for moderate pain.     brimonidine (ALPHAGAN P) 0.1 % SOLN Place 1 drop into both eyes in the morning and at bedtime.     CALCIUM PO Take 600 mg by mouth daily.     Camphor-Menthol-Methyl Sal (SALONPAS) 3.01-07-08 % PTCH Place 1 patch onto the skin daily as needed (pain).     carboxymethylcellulose (REFRESH PLUS) 0.5 % SOLN Place 1 drop into both eyes daily as needed (dry eyes).     Cholecalciferol (VITAMIN D3 PO) Take 1 tablet by mouth daily.     diclofenac Sodium (VOLTAREN) 1 % GEL Apply 1 application topically 4 (four) times daily as needed (pain).     finasteride (PROSCAR) 5 MG tablet Take 5 mg by mouth daily.     hydrochlorothiazide (HYDRODIURIL) 25 MG tablet Take 25 mg by mouth daily. Takes 1/2 tab am     losartan (COZAAR) 25 MG tablet Take 25 mg by mouth at bedtime.     Melatonin 10 MG CAPS Take 10  mg by mouth at bedtime as needed (sleep).     Multiple Vitamins-Minerals (PRESERVISION AREDS 2) CAPS Take 1 capsule by mouth daily.     traZODone (DESYREL) 50 MG tablet Take 50 mg by mouth at bedtime as needed for sleep.     vitamin B-12 (CYANOCOBALAMIN) 100 MCG tablet Take 200 mcg by mouth daily.     Zoledronic Acid (RECLAST IV) Inject into the vein. Once per year     oxyCODONE (OXY IR/ROXICODONE) 5 MG immediate release tablet Take 1 tablet (5 mg total) by mouth every 4 (four) hours as needed for severe pain. (Patient not taking: Reported on 01/03/2022) 30 tablet 0   traMADol (ULTRAM) 50 MG tablet Take 1 tablet (50 mg total) by mouth every 4 (four) hours as needed for moderate pain. (Patient not taking: Reported on 02/15/2022) 30 tablet 0   No current facility-administered medications for this visit.    Review of Systems  Constitutional:  Positive for fatigue. Negative for appetite change, chills, fever and unexpected weight change.  HENT:   Negative for hearing loss and voice change.   Eyes:  Negative for eye problems and icterus.  Respiratory:  Negative for chest tightness, cough and shortness of breath.   Cardiovascular:  Negative for chest pain and leg swelling.  Gastrointestinal:  Negative for abdominal distention and abdominal pain.  Endocrine: Negative for hot flashes.  Genitourinary:  Negative for difficulty urinating, dysuria and frequency.   Musculoskeletal:  Negative for arthralgias.  Skin:  Negative for itching and rash.  Neurological:  Negative for light-headedness and numbness.  Hematological:  Negative for adenopathy. Does not bruise/bleed easily.  Psychiatric/Behavioral:  Negative for confusion.     PHYSICAL EXAMINATION: ECOG PERFORMANCE STATUS: 1 - Symptomatic but completely ambulatory Vitals:   08/22/22 1455  BP: 139/84  Pulse: 66  Resp: 18  Temp: 98.5 F (36.9 C)   Filed Weights   08/22/22 1455  Weight: 142 lb 8 oz (64.6 kg)    Physical Exam Constitutional:       General: He is not in acute distress. HENT:     Head: Normocephalic and atraumatic.  Eyes:     General: No scleral icterus. Cardiovascular:  Rate and Rhythm: Normal rate.  Pulmonary:     Effort: Pulmonary effort is normal. No respiratory distress.     Breath sounds: No wheezing.  Abdominal:     General: Bowel sounds are normal. There is no distension.     Palpations: Abdomen is soft.  Musculoskeletal:        General: No deformity. Normal range of motion.     Cervical back: Normal range of motion.  Skin:    General: Skin is warm.     Findings: No rash.  Neurological:     Mental Status: He is alert and oriented to person, place, and time. Mental status is at baseline.     Cranial Nerves: No cranial nerve deficit.  Psychiatric:        Mood and Affect: Mood normal.      LABORATORY DATA:  I have reviewed the data as listed    Latest Ref Rng & Units 08/21/2022    8:59 AM 05/16/2022   12:56 PM 02/13/2022   10:39 AM  CBC  WBC 4.0 - 10.5 K/uL 7.3  6.7  7.2   Hemoglobin 13.0 - 17.0 g/dL 40.9  81.1  91.4   Hematocrit 39.0 - 52.0 % 42.2  38.9  34.8   Platelets 150 - 400 K/uL 214  269  302       Latest Ref Rng & Units 01/11/2022   10:25 AM 01/06/2022    4:45 AM 01/05/2022    4:26 AM  CMP  Glucose 70 - 99 mg/dL 72  93  99   BUN 8 - 23 mg/dL 30  13  12    Creatinine 0.61 - 1.24 mg/dL 7.82  9.56  2.13   Sodium 135 - 145 mmol/L 135  139  133   Potassium 3.5 - 5.1 mmol/L 3.5  3.7  3.6   Chloride 98 - 111 mmol/L 103  110  107   CO2 22 - 32 mmol/L 26  22  21    Calcium 8.9 - 10.3 mg/dL 8.6  8.5  8.0   Total Protein 6.5 - 8.1 g/dL 6.0     Total Bilirubin 0.3 - 1.2 mg/dL 0.5     Alkaline Phos 38 - 126 U/L 48     AST 15 - 41 U/L 16     ALT 0 - 44 U/L 6         Component Value Date/Time   IRON 48 08/21/2022 0859   TIBC 260 08/21/2022 0859   FERRITIN 27 08/21/2022 0859   FERRITIN 94 04/16/2011 1406   IRONPCTSAT 18 08/21/2022 0859     RADIOGRAPHIC STUDIES: I have  personally reviewed the radiological images as listed and agreed with the findings in the report. No results found.

## 2022-08-22 NOTE — Progress Notes (Signed)
Pt here for follow up. Reports that he a few skin cancer places cut off from neck and middle of back

## 2022-08-22 NOTE — Assessment & Plan Note (Signed)
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 Monitor iron panel, currently no evidence of iron over load.  Ok with multivitamin containing iron.

## 2022-08-22 NOTE — Assessment & Plan Note (Addendum)
Labs reviewed and discussed with patient Lab Results  Component Value Date   HGB 14.3 08/21/2022   TIBC 260 08/21/2022   IRONPCTSAT 18 08/21/2022   FERRITIN 27 08/21/2022    Hemoglobin has improved. Iron panel has improved.  Hold off additional IV venofer.

## 2022-08-22 NOTE — Assessment & Plan Note (Signed)
I suspect that he may have chronic intermittent GI blood loss.  At least currently hemoglobin has improved with treatments.  GI recommendation was reviewed. Recommend observation.  Follow up with GI if he develops bleeding events or further drop of hemoglobin level despite IV iron treatments. Marland Kitchen

## 2023-02-18 ENCOUNTER — Inpatient Hospital Stay: Payer: Medicare PPO | Attending: Oncology

## 2023-02-18 DIAGNOSIS — K5520 Angiodysplasia of colon without hemorrhage: Secondary | ICD-10-CM | POA: Diagnosis not present

## 2023-02-18 DIAGNOSIS — D500 Iron deficiency anemia secondary to blood loss (chronic): Secondary | ICD-10-CM

## 2023-02-18 DIAGNOSIS — D509 Iron deficiency anemia, unspecified: Secondary | ICD-10-CM | POA: Diagnosis present

## 2023-02-18 LAB — CBC WITH DIFFERENTIAL (CANCER CENTER ONLY)
Abs Immature Granulocytes: 0.02 10*3/uL (ref 0.00–0.07)
Basophils Absolute: 0.1 10*3/uL (ref 0.0–0.1)
Basophils Relative: 1 %
Eosinophils Absolute: 0.4 10*3/uL (ref 0.0–0.5)
Eosinophils Relative: 6 %
HCT: 29.8 % — ABNORMAL LOW (ref 39.0–52.0)
Hemoglobin: 9 g/dL — ABNORMAL LOW (ref 13.0–17.0)
Immature Granulocytes: 0 %
Lymphocytes Relative: 28 %
Lymphs Abs: 1.6 10*3/uL (ref 0.7–4.0)
MCH: 23.4 pg — ABNORMAL LOW (ref 26.0–34.0)
MCHC: 30.2 g/dL (ref 30.0–36.0)
MCV: 77.6 fL — ABNORMAL LOW (ref 80.0–100.0)
Monocytes Absolute: 0.7 10*3/uL (ref 0.1–1.0)
Monocytes Relative: 11 %
Neutro Abs: 3.1 10*3/uL (ref 1.7–7.7)
Neutrophils Relative %: 54 %
Platelet Count: 420 10*3/uL — ABNORMAL HIGH (ref 150–400)
RBC: 3.84 MIL/uL — ABNORMAL LOW (ref 4.22–5.81)
RDW: 18.4 % — ABNORMAL HIGH (ref 11.5–15.5)
WBC Count: 5.8 10*3/uL (ref 4.0–10.5)
nRBC: 0 % (ref 0.0–0.2)

## 2023-02-18 LAB — IRON AND TIBC
Iron: 19 ug/dL — ABNORMAL LOW (ref 45–182)
Saturation Ratios: 5 % — ABNORMAL LOW (ref 17.9–39.5)
TIBC: 361 ug/dL (ref 250–450)
UIBC: 342 ug/dL

## 2023-02-18 LAB — FERRITIN: Ferritin: 3 ng/mL — ABNORMAL LOW (ref 24–336)

## 2023-02-20 ENCOUNTER — Inpatient Hospital Stay: Payer: Medicare PPO

## 2023-02-20 ENCOUNTER — Inpatient Hospital Stay: Payer: Medicare PPO | Admitting: Oncology

## 2023-02-25 ENCOUNTER — Inpatient Hospital Stay: Payer: Medicare PPO | Admitting: Oncology

## 2023-02-25 ENCOUNTER — Encounter: Payer: Self-pay | Admitting: Oncology

## 2023-02-25 ENCOUNTER — Inpatient Hospital Stay: Payer: Medicare PPO

## 2023-02-25 VITALS — BP 135/73 | HR 71 | Temp 97.9°F | Resp 18 | Wt 143.2 lb

## 2023-02-25 DIAGNOSIS — Q2730 Arteriovenous malformation, site unspecified: Secondary | ICD-10-CM | POA: Diagnosis not present

## 2023-02-25 DIAGNOSIS — D500 Iron deficiency anemia secondary to blood loss (chronic): Secondary | ICD-10-CM

## 2023-02-25 DIAGNOSIS — D509 Iron deficiency anemia, unspecified: Secondary | ICD-10-CM | POA: Diagnosis not present

## 2023-02-25 MED ORDER — IRON SUCROSE 20 MG/ML IV SOLN
200.0000 mg | Freq: Once | INTRAVENOUS | Status: AC
Start: 2023-02-25 — End: 2023-02-25
  Administered 2023-02-25: 200 mg via INTRAVENOUS
  Filled 2023-02-25: qty 10

## 2023-02-25 NOTE — Assessment & Plan Note (Signed)
 chronic intermittent GI blood loss.  Previous GI recommendation was reviewed. Recommend observation.  Follow up with GI if he develops bleeding events or further drop of hemoglobin level despite IV iron treatments.  Patient is interested in obtaining 2nd opinion and will further discuss with his primary care provider

## 2023-02-25 NOTE — Progress Notes (Signed)
 Pt here for follow up. NO new concerns voiced

## 2023-02-25 NOTE — Progress Notes (Signed)
 Hematology/Oncology Progress note [phone] [phone]      CHIEF COMPLAINTS/REASON FOR VISIT:  Anemia  ASSESSMENT & PLAN:   IDA (iron deficiency anemia) Labs reviewed and discussed with patient Lab Results  Component Value Date   HGB 9.0 (L) 02/18/2023   TIBC 361 02/18/2023   IRONPCTSAT 5 (L) 02/18/2023   FERRITIN 3 (L) 02/18/2023    Hemoglobin has decreased again. 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  Previous GI recommendation was reviewed. Recommend observation.  Follow up with GI if he develops bleeding events or further drop of hemoglobin level despite IV iron treatments.  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 Monitor iron panel, currently no evidence of iron over load.    Orders Placed This Encounter  Procedures   Hepatic function panel    Standing Status:   Future    Expected Date:   05/25/2023    Expiration Date:   02/25/2024   CBC with Differential (Cancer Center Only)    Standing Status:   Future    Expected Date:   05/25/2023    Expiration Date:   02/25/2024   Iron and TIBC    Standing Status:   Future    Expected Date:   05/25/2023    Expiration Date:   02/25/2024   Ferritin    Standing Status:   Future    Expected Date:   05/25/2023    Expiration Date:   02/25/2024   Follow up in 6 months. All questions were answered. The patient knows to call the clinic with any problems, questions or concerns.  Rickard Patience, MD, PhD [facility] Hematology Oncology 02/25/2023     HISTORY OF PRESENTING ILLNESS:  Carlos Neal. is a  83 y.o.  male with PMH listed below who was referred to me for anemia Reviewed patient's recent labs that was done.  He was found to have abnormal CBC on 01/11/2022  with a hemoglobin of 7. Patient was recently admitted to the hospital due to acute drop of hemoglobin to 5.2, status post multiple units of PRBC transfusion.  01/03/2022, iron panel is consistent with severe iron deficiency.  He recently had hip surgery and was on Lovenox anticoagulation prophylaxis, in combination use of Celebrex.  Patient underwent upper endoscopy, colonoscopy and capsule study.  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  Patient was discharged on 01/06/2022 with a hemoglobin of 7.4. Patient reports feeling tired and fatigued. He had not noticed any recent bleeding such as epistaxis, hematuria or hematochezia.   Patient reports remote history of hereditary hemochromatosis, diagnosed with 30 years ago with a " iron level of 3000".  Patient underwent phlebotomy frequently at that point. Since he establish care with Dr. Burnadette Pop 11 years ago, he has not needed phlebotomy. Reviewed his previous blood work in [employer].  Patient has had decreased ferritin level dated back to at least 2015.    INTERVAL HISTORY Carlos Neal. is a 83 y.o. male who has above history reviewed by me today presents for follow up visit for iron deficiency anemia. He tolerated IV Venofer treatments.  Denies any melena, hematuria, hematochezia.  Fatigue   MEDICAL HISTORY:  Past Medical History:  Diagnosis Date   Anemia    Asthma    as a child  GERD (gastroesophageal reflux disease)    Hypertension    Osteoarthritis    back and knee   Osteopenia    Osteoporosis    Vitamin D deficiency     SURGICAL HISTORY: Past Surgical History:  Procedure Laterality Date   BROW LIFT Bilateral 09/23/2020   Procedure: BLEPHAROPTOSIS REPAIR; RESECT EX  BROW PTOSIS REPAIR ECTROPION REPAIR, EXTENSIVE BILATERAL;  Surgeon: Imagene Riches, MD;  Location: [facility];  Service: Ophthalmology;  Laterality: Bilateral;  wants to be later in the schedule   COLONOSCOPY  01/12/2013   COLONOSCOPY N/A  01/05/2022   Procedure: COLONOSCOPY;  Surgeon: Toledo, Boykin Nearing, MD;  Location: [facility];  Service: Gastroenterology;  Laterality: N/A;   ESOPHAGOGASTRODUODENOSCOPY (EGD) WITH PROPOFOL N/A 01/05/2022   Procedure: ESOPHAGOGASTRODUODENOSCOPY (EGD) WITH PROPOFOL;  Surgeon: Toledo, Boykin Nearing, MD;  Location: [facility];  Service: Gastroenterology;  Laterality: N/A;   GIVENS CAPSULE STUDY N/A 01/05/2022   Procedure: GIVENS CAPSULE STUDY;  Surgeon: Toledo, Boykin Nearing, MD;  Location: [facility];  Service: Gastroenterology;  Laterality: N/A;   HYDROCELE EXCISION     age 57   INGUINAL HERNIA REPAIR Bilateral    as a child   KNEE ARTHROPLASTY Right 01/30/2021   Procedure: COMPUTER ASSISTED TOTAL KNEE ARTHROPLASTY;  Surgeon: Donato Heinz, MD;  Location: [facility];  Service: Orthopedics;  Laterality: Right;   LUMBAR LAMINECTOMY/ DECOMPRESSION WITH MET-RX Right 01/15/2020   Procedure: L2-3 DECOMPRESSION, RIGHT L2-3 FAR LATERAL DISCECTOMY;  Surgeon: Venetia Night, MD;  Location: [facility];  Service: Neurosurgery;  Laterality: Right;  2nd case   TOTAL HIP ARTHROPLASTY Left 12/04/2021   Procedure: TOTAL HIP ARTHROPLASTY;  Surgeon: Donato Heinz, MD;  Location: [facility];  Service: Orthopedics;  Laterality: Left;    SOCIAL HISTORY: Social History   Socioeconomic History   Marital status: Married    Spouse name: Glenda   Number of children: 1   Years of education: Not on file   Highest education level: Not on file  Occupational History   Not on file  Tobacco Use   Smoking status: Never   Smokeless tobacco: Never  Vaping Use   Vaping status: Never Used  Substance and Sexual Activity   Alcohol use: Yes    Alcohol/week: 3.0 standard drinks of alcohol    Types: 3 Glasses of wine per week    Comment: occasional   Drug use: Never   Sexual activity: Not on file  Other Topics Concern   Not on file  Social History Narrative   Not on file   Social Drivers of Health   Financial  Resource Strain: Low Risk  (11/14/2022)   Received from [facility]   Overall Financial Resource Strain (CARDIA)    Difficulty of Paying Living Expenses: Not hard at all  Food Insecurity: No Food Insecurity (11/14/2022)   Received from [facility]   Hunger Vital Sign    Worried About Running Out of Food in the Last Year: Never true    Ran Out of Food in the Last Year: Never true  Transportation Needs: No Transportation Needs (11/14/2022)   Received from [facility] - Transportation    In the past 12 months, has lack of transportation kept you from medical appointments or from getting medications?: No    Lack of Transportation (Non-Medical): No  Physical Activity: Not on file  Stress: Not on file  Social Connections: Not on file  Intimate Partner Violence: Not  At Risk (01/04/2022)   Humiliation, Afraid, Rape, and Kick questionnaire    Fear of Current or Ex-Partner: No    Emotionally Abused: No    Physically Abused: No    Sexually Abused: No    FAMILY HISTORY: Family History  Problem Relation Age of Onset   COPD Mother    Blindness Father    Leukemia Maternal Uncle     ALLERGIES:  has no known allergies.  MEDICATIONS:  Current Outpatient Medications  Medication Sig Dispense Refill   acetaminophen (TYLENOL) 500 MG tablet Take 1,000 mg by mouth every 6 (six) hours as needed for moderate pain.     brimonidine (ALPHAGAN P) 0.1 % SOLN Place 1 drop into both eyes in the morning and at bedtime.     CALCIUM PO Take 600 mg by mouth daily.     Camphor-Menthol-Methyl Sal (SALONPAS) 3.01-07-08 % PTCH Place 1 patch onto the skin daily as needed (pain).     carboxymethylcellulose (REFRESH PLUS) 0.5 % SOLN Place 1 drop into both eyes daily as needed (dry eyes).     Cholecalciferol (VITAMIN D3 PO) Take 1 tablet by mouth daily.     diclofenac Sodium (VOLTAREN) 1 % GEL Apply 1 application topically 4 (four) times daily as needed  (pain).     finasteride (PROSCAR) 5 MG tablet Take 5 mg by mouth daily.     hydrochlorothiazide (HYDRODIURIL) 25 MG tablet Take 25 mg by mouth daily. Takes 1/2 tab am     losartan (COZAAR) 25 MG tablet Take 25 mg by mouth at bedtime.     Melatonin 10 MG CAPS Take 10 mg by mouth at bedtime as needed (sleep).     Multiple Vitamins-Minerals (PRESERVISION AREDS 2) CAPS Take 1 capsule by mouth daily.     traZODone (DESYREL) 50 MG tablet Take 50 mg by mouth at bedtime as needed for sleep.     vitamin B-12 (CYANOCOBALAMIN) 100 MCG tablet Take 200 mcg by mouth daily.     Zoledronic Acid (RECLAST IV) Inject into the vein. Once per year     oxyCODONE (OXY IR/ROXICODONE) 5 MG immediate release tablet Take 1 tablet (5 mg total) by mouth every 4 (four) hours as needed for severe pain. (Patient not taking: Reported on 02/25/2023) 30 tablet 0   traMADol (ULTRAM) 50 MG tablet Take 1 tablet (50 mg total) by mouth every 4 (four) hours as needed for moderate pain. (Patient not taking: Reported on 02/25/2023) 30 tablet 0   No current facility-administered medications for this visit.    Review of Systems  Constitutional:  Positive for fatigue. Negative for appetite change, chills, fever and unexpected weight change.  HENT:   Negative for hearing loss and voice change.   Eyes:  Negative for eye problems and icterus.  Respiratory:  Negative for chest tightness, cough and shortness of breath.   Cardiovascular:  Negative for chest pain and leg swelling.  Gastrointestinal:  Negative for abdominal distention and abdominal pain.  Endocrine: Negative for hot flashes.  Genitourinary:  Negative for difficulty urinating, dysuria and frequency.   Musculoskeletal:  Negative for arthralgias.  Skin:  Negative for itching and rash.  Neurological:  Negative for light-headedness and numbness.  Hematological:  Negative for adenopathy. Does not bruise/bleed easily.  Psychiatric/Behavioral:  Negative for confusion.     PHYSICAL  EXAMINATION: ECOG PERFORMANCE STATUS: 1 - Symptomatic but completely ambulatory Vitals:   02/25/23 1435  BP: 135/73  Pulse: 71  Resp: 18  Temp: 97.9 F (36.6  C)   Filed Weights   02/25/23 1435  Weight: 143 lb 3.2 oz (65 kg)    Physical Exam Constitutional:      General: He is not in acute distress. HENT:     Head: Normocephalic and atraumatic.  Eyes:     General: No scleral icterus. Cardiovascular:     Rate and Rhythm: Normal rate.  Pulmonary:     Effort: Pulmonary effort is normal. No respiratory distress.     Breath sounds: No wheezing.  Abdominal:     General: There is no distension.  Musculoskeletal:        General: Normal range of motion.     Cervical back: Normal range of motion.  Skin:    Findings: No rash.  Neurological:     Mental Status: He is alert and oriented to person, place, and time. Mental status is at baseline.  Psychiatric:        Mood and Affect: Mood normal.      LABORATORY DATA:  I have reviewed the data as listed    Latest Ref Rng & Units 02/18/2023    8:48 AM 08/21/2022    8:59 AM 05/16/2022   12:56 PM  CBC  WBC 4.0 - 10.5 K/uL 5.8  7.3  6.7   Hemoglobin 13.0 - 17.0 g/dL 9.0  65.7  84.6   Hematocrit 39.0 - 52.0 % 29.8  42.2  38.9   Platelets 150 - 400 K/uL 420  214  269       Latest Ref Rng & Units 01/11/2022   10:25 AM 01/06/2022    4:45 AM 01/05/2022    4:26 AM  CMP  Glucose 70 - 99 mg/dL 72  93  99   BUN 8 - 23 mg/dL 30  13  12    Creatinine 0.61 - 1.24 mg/dL 9.62  9.52  8.41   Sodium 135 - 145 mmol/L 135  139  133   Potassium 3.5 - 5.1 mmol/L 3.5  3.7  3.6   Chloride 98 - 111 mmol/L 103  110  107   CO2 22 - 32 mmol/L 26  22  21    Calcium 8.9 - 10.3 mg/dL 8.6  8.5  8.0   Total Protein 6.5 - 8.1 g/dL 6.0     Total Bilirubin 0.3 - 1.2 mg/dL 0.5     Alkaline Phos 38 - 126 U/L 48     AST 15 - 41 U/L 16     ALT 0 - 44 U/L 6         Component Value Date/Time   IRON 19 (L) 02/18/2023 0848   TIBC 361 02/18/2023 0848   FERRITIN 3  (L) 02/18/2023 0848   FERRITIN 94 04/16/2011 1406   IRONPCTSAT 5 (L) 02/18/2023 0848     RADIOGRAPHIC STUDIES: I have personally reviewed the radiological images as listed and agreed with the findings in the report. No results found.

## 2023-02-25 NOTE — Assessment & Plan Note (Signed)
 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 Monitor iron panel, currently no evidence of iron over load.

## 2023-02-25 NOTE — Assessment & Plan Note (Addendum)
 Labs reviewed and discussed with patient Lab Results  Component Value Date   HGB 9.0 (L) 02/18/2023   TIBC 361 02/18/2023   IRONPCTSAT 5 (L) 02/18/2023   FERRITIN 3 (L) 02/18/2023    Hemoglobin has decreased again. Recommend IV venofer weekly x 4

## 2023-03-04 ENCOUNTER — Inpatient Hospital Stay: Payer: Medicare PPO | Attending: Oncology

## 2023-03-04 VITALS — BP 123/64 | HR 64 | Temp 97.9°F | Resp 16

## 2023-03-04 DIAGNOSIS — D509 Iron deficiency anemia, unspecified: Secondary | ICD-10-CM | POA: Diagnosis present

## 2023-03-04 DIAGNOSIS — D500 Iron deficiency anemia secondary to blood loss (chronic): Secondary | ICD-10-CM

## 2023-03-04 MED ORDER — IRON SUCROSE 20 MG/ML IV SOLN
200.0000 mg | Freq: Once | INTRAVENOUS | Status: AC
  Administered 2023-03-04: 200 mg via INTRAVENOUS
  Filled 2023-03-04: qty 10

## 2023-03-04 MED ORDER — SODIUM CHLORIDE 0.9% FLUSH
10.0000 mL | Freq: Once | INTRAVENOUS | Status: AC | PRN
  Administered 2023-03-04: 10 mL
  Filled 2023-03-04: qty 10

## 2023-03-04 NOTE — Patient Instructions (Signed)

## 2023-03-11 ENCOUNTER — Inpatient Hospital Stay: Payer: Medicare PPO

## 2023-03-11 VITALS — BP 132/71 | HR 60 | Temp 98.2°F | Resp 17

## 2023-03-11 DIAGNOSIS — D500 Iron deficiency anemia secondary to blood loss (chronic): Secondary | ICD-10-CM

## 2023-03-11 DIAGNOSIS — D509 Iron deficiency anemia, unspecified: Secondary | ICD-10-CM | POA: Diagnosis not present

## 2023-03-11 MED ORDER — IRON SUCROSE 20 MG/ML IV SOLN
200.0000 mg | Freq: Once | INTRAVENOUS | Status: AC
  Administered 2023-03-11: 200 mg via INTRAVENOUS

## 2023-03-11 MED ORDER — SODIUM CHLORIDE 0.9% FLUSH
10.0000 mL | Freq: Once | INTRAVENOUS | Status: AC | PRN
  Administered 2023-03-11: 10 mL
  Filled 2023-03-11: qty 10

## 2023-03-18 ENCOUNTER — Inpatient Hospital Stay: Payer: Medicare PPO

## 2023-03-18 VITALS — BP 119/59 | HR 66 | Temp 97.6°F | Resp 18

## 2023-03-18 DIAGNOSIS — D509 Iron deficiency anemia, unspecified: Secondary | ICD-10-CM | POA: Diagnosis not present

## 2023-03-18 DIAGNOSIS — D500 Iron deficiency anemia secondary to blood loss (chronic): Secondary | ICD-10-CM

## 2023-03-18 MED ORDER — IRON SUCROSE 20 MG/ML IV SOLN
200.0000 mg | Freq: Once | INTRAVENOUS | Status: AC
  Administered 2023-03-18: 200 mg via INTRAVENOUS

## 2023-03-18 NOTE — Patient Instructions (Signed)

## 2023-06-03 ENCOUNTER — Inpatient Hospital Stay: Payer: Medicare PPO | Attending: Oncology

## 2023-06-03 DIAGNOSIS — K5520 Angiodysplasia of colon without hemorrhage: Secondary | ICD-10-CM | POA: Insufficient documentation

## 2023-06-03 DIAGNOSIS — D509 Iron deficiency anemia, unspecified: Secondary | ICD-10-CM | POA: Insufficient documentation

## 2023-06-03 DIAGNOSIS — D500 Iron deficiency anemia secondary to blood loss (chronic): Secondary | ICD-10-CM

## 2023-06-03 LAB — CBC WITH DIFFERENTIAL (CANCER CENTER ONLY)
Abs Immature Granulocytes: 0.05 10*3/uL (ref 0.00–0.07)
Basophils Absolute: 0 10*3/uL (ref 0.0–0.1)
Basophils Relative: 0 %
Eosinophils Absolute: 0.1 10*3/uL (ref 0.0–0.5)
Eosinophils Relative: 2 %
HCT: 32.9 % — ABNORMAL LOW (ref 39.0–52.0)
Hemoglobin: 10.7 g/dL — ABNORMAL LOW (ref 13.0–17.0)
Immature Granulocytes: 1 %
Lymphocytes Relative: 28 %
Lymphs Abs: 1.5 10*3/uL (ref 0.7–4.0)
MCH: 28.8 pg (ref 26.0–34.0)
MCHC: 32.5 g/dL (ref 30.0–36.0)
MCV: 88.4 fL (ref 80.0–100.0)
Monocytes Absolute: 0.5 10*3/uL (ref 0.1–1.0)
Monocytes Relative: 10 %
Neutro Abs: 3.2 10*3/uL (ref 1.7–7.7)
Neutrophils Relative %: 59 %
Platelet Count: 308 10*3/uL (ref 150–400)
RBC: 3.72 MIL/uL — ABNORMAL LOW (ref 4.22–5.81)
RDW: 18.2 % — ABNORMAL HIGH (ref 11.5–15.5)
WBC Count: 5.4 10*3/uL (ref 4.0–10.5)
nRBC: 0 % (ref 0.0–0.2)

## 2023-06-03 LAB — FERRITIN: Ferritin: 7 ng/mL — ABNORMAL LOW (ref 24–336)

## 2023-06-03 LAB — HEPATIC FUNCTION PANEL
ALT: 9 U/L (ref 0–44)
AST: 16 U/L (ref 15–41)
Albumin: 3.9 g/dL (ref 3.5–5.0)
Alkaline Phosphatase: 46 U/L (ref 38–126)
Bilirubin, Direct: 0.2 mg/dL (ref 0.0–0.2)
Indirect Bilirubin: 0.6 mg/dL (ref 0.3–0.9)
Total Bilirubin: 0.8 mg/dL (ref 0.0–1.2)
Total Protein: 6.3 g/dL — ABNORMAL LOW (ref 6.5–8.1)

## 2023-06-03 LAB — IRON AND TIBC
Iron: 21 ug/dL — ABNORMAL LOW (ref 45–182)
Saturation Ratios: 7 % — ABNORMAL LOW (ref 17.9–39.5)
TIBC: 321 ug/dL (ref 250–450)
UIBC: 300 ug/dL

## 2023-06-05 ENCOUNTER — Inpatient Hospital Stay: Payer: Medicare PPO

## 2023-06-05 ENCOUNTER — Encounter: Payer: Self-pay | Admitting: Oncology

## 2023-06-05 ENCOUNTER — Inpatient Hospital Stay: Payer: Medicare PPO | Admitting: Oncology

## 2023-06-05 VITALS — BP 144/73 | HR 82 | Temp 97.0°F | Resp 15 | Wt 143.0 lb

## 2023-06-05 VITALS — BP 143/68 | HR 75 | Temp 98.0°F | Resp 16

## 2023-06-05 DIAGNOSIS — D500 Iron deficiency anemia secondary to blood loss (chronic): Secondary | ICD-10-CM

## 2023-06-05 DIAGNOSIS — D509 Iron deficiency anemia, unspecified: Secondary | ICD-10-CM | POA: Diagnosis not present

## 2023-06-05 DIAGNOSIS — Q2730 Arteriovenous malformation, site unspecified: Secondary | ICD-10-CM

## 2023-06-05 MED ORDER — IRON SUCROSE 20 MG/ML IV SOLN
200.0000 mg | Freq: Once | INTRAVENOUS | Status: AC
  Administered 2023-06-05: 200 mg via INTRAVENOUS

## 2023-06-05 MED ORDER — SODIUM CHLORIDE 0.9% FLUSH
10.0000 mL | Freq: Once | INTRAVENOUS | Status: AC | PRN
  Administered 2023-06-05: 10 mL
  Filled 2023-06-05: qty 10

## 2023-06-05 NOTE — Assessment & Plan Note (Addendum)
 Labs reviewed and discussed with patient Lab Results  Component Value Date   HGB 10.7 (L) 06/03/2023   TIBC 321 06/03/2023   IRONPCTSAT 7 (L) 06/03/2023   FERRITIN 7 (L) 06/03/2023    Hemoglobin has improved.  Recommend IV venofer  weekly x 2 Recommend patient to start iron  contained multivitamin supplementation .

## 2023-06-05 NOTE — Assessment & Plan Note (Addendum)
 chronic intermittent GI blood loss.  Previous GI recommendation was reviewed. Recommend observation.   Patient is interested in obtaining 2nd opinion and will further discuss with his primary care provider

## 2023-06-05 NOTE — Progress Notes (Signed)
 Hematology/Oncology Progress note [phone] [phone]      CHIEF COMPLAINTS/REASON FOR VISIT:  Anemia  ASSESSMENT & PLAN:   IDA (iron  deficiency anemia) Labs reviewed and discussed with patient Lab Results  Component Value Date   HGB 10.7 (L) 06/03/2023   TIBC 321 06/03/2023   IRONPCTSAT 7 (L) 06/03/2023   FERRITIN 7 (L) 06/03/2023    Hemoglobin has improved.  Recommend IV venofer  weekly x 2 Recommend patient to start iron  contained multivitamin supplementation .  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  Previous GI recommendation was reviewed. Recommend observation.   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 Monitor iron  panel, currently no evidence of iron  over load.    Orders Placed This Encounter  Procedures   CBC with Differential (Cancer Center Only)    Standing Status:   Future    Expected Date:   09/05/2023    Expiration Date:   06/04/2024   Iron  and TIBC    Standing Status:   Future    Expected Date:   09/05/2023    Expiration Date:   06/04/2024   Ferritin    Standing Status:   Future    Expected Date:   09/05/2023    Expiration Date:   06/04/2024   Retic Panel    Standing Status:   Future    Expected Date:   09/05/2023    Expiration Date:   06/04/2024   Follow up in 3 months. All questions were answered. The patient knows to call the clinic with any problems, questions or concerns.  Carlos Forbes, MD, PhD [facility] Hematology Oncology 06/05/2023     HISTORY OF PRESENTING ILLNESS:  Carlos Mobley. is a  83 y.o.  male with PMH listed below who was referred to me for anemia Reviewed patient's recent labs that was done.  He was found to have abnormal CBC on 01/11/2022 with a hemoglobin of 7. Patient was recently admitted to the  hospital due to acute drop of hemoglobin to 5.2, status post multiple units of PRBC transfusion.  01/03/2022, iron  panel is consistent with severe iron  deficiency.  He recently had hip surgery and was on Lovenox  anticoagulation prophylaxis, in combination use of Celebrex .  Patient underwent upper endoscopy, colonoscopy and capsule study.  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  Patient was discharged on 01/06/2022 with a hemoglobin of 7.4. Patient reports feeling tired and fatigued. He had not noticed any recent bleeding such as epistaxis, hematuria or hematochezia.   Patient reports remote history of hereditary hemochromatosis, diagnosed with 30 years ago with a " iron  level of 3000".  Patient underwent phlebotomy frequently at that point. Since he establish care with Dr. Jerone Moorman 11 years ago, he has not needed phlebotomy. Reviewed his previous blood work in [employer].  Patient has had decreased ferritin level dated back to at least 2015.    INTERVAL HISTORY Carlos Banfill. is a 83 y.o. male who has above history reviewed by me today presents for follow up visit for iron  deficiency anemia. He tolerated IV Venofer  treatments.  Denies any melena, hematuria, hematochezia.  Fatigue   MEDICAL HISTORY:  Past Medical History:  Diagnosis Date   Anemia    Asthma    as a child   GERD (gastroesophageal reflux disease)    Hypertension  Osteoarthritis    back and knee   Osteopenia    Osteoporosis    Vitamin D  deficiency     SURGICAL HISTORY: Past Surgical History:  Procedure Laterality Date   BROW LIFT Bilateral 09/23/2020   Procedure: BLEPHAROPTOSIS REPAIR; RESECT EX  BROW PTOSIS REPAIR ECTROPION REPAIR, EXTENSIVE BILATERAL;  Surgeon: Zacarias Hermann, MD;  Location: [facility];  Service: Ophthalmology;  Laterality: Bilateral;  wants to be later in the schedule   COLONOSCOPY  01/12/2013   COLONOSCOPY N/A 01/05/2022   Procedure: COLONOSCOPY;  Surgeon: Toledo, Alphonsus Jeans,  MD;  Location: [facility];  Service: Gastroenterology;  Laterality: N/A;   ESOPHAGOGASTRODUODENOSCOPY (EGD) WITH PROPOFOL  N/A 01/05/2022   Procedure: ESOPHAGOGASTRODUODENOSCOPY (EGD) WITH PROPOFOL ;  Surgeon: Toledo, Alphonsus Jeans, MD;  Location: [facility];  Service: Gastroenterology;  Laterality: N/A;   GIVENS CAPSULE STUDY N/A 01/05/2022   Procedure: GIVENS CAPSULE STUDY;  Surgeon: Toledo, Alphonsus Jeans, MD;  Location: [facility];  Service: Gastroenterology;  Laterality: N/A;   HYDROCELE EXCISION     age 74   INGUINAL HERNIA REPAIR Bilateral    as a child   KNEE ARTHROPLASTY Right 01/30/2021   Procedure: COMPUTER ASSISTED TOTAL KNEE ARTHROPLASTY;  Surgeon: Arlyne Lame, MD;  Location: [facility];  Service: Orthopedics;  Laterality: Right;   LUMBAR LAMINECTOMY/ DECOMPRESSION WITH MET-RX Right 01/15/2020   Procedure: L2-3 DECOMPRESSION, RIGHT L2-3 FAR LATERAL DISCECTOMY;  Surgeon: Jodeen Munch, MD;  Location: [facility];  Service: Neurosurgery;  Laterality: Right;  2nd case   TOTAL HIP ARTHROPLASTY Left 12/04/2021   Procedure: TOTAL HIP ARTHROPLASTY;  Surgeon: Arlyne Lame, MD;  Location: [facility];  Service: Orthopedics;  Laterality: Left;    SOCIAL HISTORY: Social History   Socioeconomic History   Marital status: Married    Spouse name: Glenda   Number of children: 1   Years of education: Not on file   Highest education level: Not on file  Occupational History   Not on file  Tobacco Use   Smoking status: Never   Smokeless tobacco: Never  Vaping Use   Vaping status: Never Used  Substance and Sexual Activity   Alcohol  use: Yes    Alcohol /week: 3.0 standard drinks of alcohol     Types: 3 Glasses of wine per week    Comment: occasional   Drug use: Never   Sexual activity: Not on file  Other Topics Concern   Not on file  Social History Narrative   Not on file   Social Drivers of Health   Financial Resource Strain: Low Risk  (11/14/2022)   Received from [facility]   Overall Financial Resource Strain (CARDIA)    Difficulty of Paying Living Expenses: Not hard at all  Food Insecurity: No Food Insecurity (11/14/2022)   Received from [facility]   Hunger Vital Sign    Worried About Running Out of Food in the Last Year: Never true    Ran Out of Food in the Last Year: Never true  Transportation Needs: No Transportation Needs (11/14/2022)   Received from [facility]    In the past 12 months, has lack of transportation kept you from medical appointments or from getting medications?: No    Lack of Transportation (Non-Medical): No  Physical Activity: Not on file  Stress: Not on file  Social Connections: Not on file  Intimate Partner Violence: Not At Risk (01/04/2022)   Humiliation, Afraid, Rape, and Kick questionnaire  Fear of Current or Ex-Partner: No    Emotionally Abused: No    Physically Abused: No    Sexually Abused: No    FAMILY HISTORY: Family History  Problem Relation Age of Onset   COPD Mother    Blindness Father    Leukemia Maternal Uncle     ALLERGIES:  has no known allergies.  MEDICATIONS:  Current Outpatient Medications  Medication Sig Dispense Refill   acetaminophen  (TYLENOL ) 500 MG tablet Take 1,000 mg by mouth every 6 (six) hours as needed for moderate pain.     brimonidine  (ALPHAGAN  P) 0.1 % SOLN Place 1 drop into both eyes in the morning and at bedtime.     CALCIUM  PO Take 600 mg by mouth daily.     Camphor-Menthol -Methyl Sal (SALONPAS) 3.01-07-08 % PTCH Place 1 patch onto the skin daily as needed (pain).     carboxymethylcellulose (REFRESH PLUS) 0.5 % SOLN Place 1 drop into both eyes daily as needed (dry eyes).     Cholecalciferol  (VITAMIN D3 PO) Take 1 tablet by mouth daily.     diclofenac Sodium (VOLTAREN) 1 % GEL Apply 1 application topically 4 (four) times daily as needed (pain).     finasteride (PROSCAR) 5 MG tablet Take 5 mg by mouth daily.      hydrochlorothiazide  (HYDRODIURIL ) 25 MG tablet Take 25 mg by mouth daily. Takes 1/2 tab am     losartan  (COZAAR ) 25 MG tablet Take 25 mg by mouth at bedtime.     Melatonin 10 MG CAPS Take 10 mg by mouth at bedtime as needed (sleep).     Multiple Vitamins-Minerals (PRESERVISION AREDS 2) CAPS Take 1 capsule by mouth daily.     traZODone  (DESYREL ) 50 MG tablet Take 50 mg by mouth at bedtime as needed for sleep.     vitamin B-12 (CYANOCOBALAMIN ) 100 MCG tablet Take 200 mcg by mouth daily.     Zoledronic Acid (RECLAST IV) Inject into the vein. Once per year     oxyCODONE  (OXY IR/ROXICODONE ) 5 MG immediate release tablet Take 1 tablet (5 mg total) by mouth every 4 (four) hours as needed for severe pain. (Patient not taking: Reported on 01/03/2022) 30 tablet 0   traMADol  (ULTRAM ) 50 MG tablet Take 1 tablet (50 mg total) by mouth every 4 (four) hours as needed for moderate pain. (Patient not taking: Reported on 02/15/2022) 30 tablet 0   No current facility-administered medications for this visit.    Review of Systems  Constitutional:  Positive for fatigue. Negative for appetite change, chills, fever and unexpected weight change.  HENT:   Negative for hearing loss and voice change.   Eyes:  Negative for eye problems and icterus.  Respiratory:  Negative for chest tightness, cough and shortness of breath.   Cardiovascular:  Negative for chest pain and leg swelling.  Gastrointestinal:  Negative for abdominal distention and abdominal pain.  Endocrine: Negative for hot flashes.  Genitourinary:  Negative for difficulty urinating, dysuria and frequency.   Musculoskeletal:  Negative for arthralgias.  Skin:  Negative for itching and rash.  Neurological:  Negative for light-headedness and numbness.  Hematological:  Negative for adenopathy. Does not bruise/bleed easily.  Psychiatric/Behavioral:  Negative for confusion.     PHYSICAL EXAMINATION: ECOG PERFORMANCE STATUS: 1 - Symptomatic but completely  ambulatory Vitals:   06/05/23 1427  BP: (!) 144/73  Pulse: 82  Resp: 15  Temp: (!) 97 F (36.1 C)  SpO2: 100%   Filed Weights   06/05/23 1427  Weight: 143 lb (64.9 kg)    Physical Exam Constitutional:      General: He is not in acute distress. HENT:     Head: Normocephalic and atraumatic.  Eyes:     General: No scleral icterus. Cardiovascular:     Rate and Rhythm: Normal rate.  Pulmonary:     Effort: Pulmonary effort is normal. No respiratory distress.     Breath sounds: No wheezing.  Abdominal:     General: There is no distension.  Musculoskeletal:        General: Normal range of motion.     Cervical back: Normal range of motion.  Skin:    Findings: No rash.  Neurological:     Mental Status: He is alert and oriented to person, place, and time. Mental status is at baseline.  Psychiatric:        Mood and Affect: Mood normal.      LABORATORY DATA:  I have reviewed the data as listed    Latest Ref Rng & Units 06/03/2023    8:55 AM 02/18/2023    8:48 AM 08/21/2022    8:59 AM  CBC  WBC 4.0 - 10.5 K/uL 5.4  5.8  7.3   Hemoglobin 13.0 - 17.0 g/dL 81.1  9.0  91.4   Hematocrit 39.0 - 52.0 % 32.9  29.8  42.2   Platelets 150 - 400 K/uL 308  420  214       Latest Ref Rng & Units 06/03/2023    8:55 AM 01/11/2022   10:25 AM 01/06/2022    4:45 AM  CMP  Glucose 70 - 99 mg/dL  72  93   BUN 8 - 23 mg/dL  30  13   Creatinine 7.82 - 1.24 mg/dL  9.56  2.13   Sodium 086 - 145 mmol/L  135  139   Potassium 3.5 - 5.1 mmol/L  3.5  3.7   Chloride 98 - 111 mmol/L  103  110   CO2 22 - 32 mmol/L  26  22   Calcium  8.9 - 10.3 mg/dL  8.6  8.5   Total Protein 6.5 - 8.1 g/dL 6.3  6.0    Total Bilirubin 0.0 - 1.2 mg/dL 0.8  0.5    Alkaline Phos 38 - 126 U/L 46  48    AST 15 - 41 U/L 16  16    ALT 0 - 44 U/L 9  6        Component Value Date/Time   IRON  21 (L) 06/03/2023 0855   TIBC 321 06/03/2023 0855   FERRITIN 7 (L) 06/03/2023 0855   FERRITIN 94 04/16/2011 1406   IRONPCTSAT 7 (L)  06/03/2023 0855     RADIOGRAPHIC STUDIES: I have personally reviewed the radiological images as listed and agreed with the findings in the report. No results found.

## 2023-06-05 NOTE — Assessment & Plan Note (Signed)
 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 Monitor iron panel, currently no evidence of iron over load.

## 2023-06-12 ENCOUNTER — Inpatient Hospital Stay

## 2023-06-12 VITALS — BP 122/57 | HR 77 | Temp 98.8°F | Resp 19

## 2023-06-12 DIAGNOSIS — D509 Iron deficiency anemia, unspecified: Secondary | ICD-10-CM | POA: Diagnosis not present

## 2023-06-12 DIAGNOSIS — D500 Iron deficiency anemia secondary to blood loss (chronic): Secondary | ICD-10-CM

## 2023-06-12 MED ORDER — SODIUM CHLORIDE 0.9% FLUSH
10.0000 mL | Freq: Once | INTRAVENOUS | Status: AC | PRN
  Administered 2023-06-12: 10 mL
  Filled 2023-06-12: qty 10

## 2023-06-12 MED ORDER — IRON SUCROSE 20 MG/ML IV SOLN
200.0000 mg | Freq: Once | INTRAVENOUS | Status: AC
  Administered 2023-06-12: 200 mg via INTRAVENOUS

## 2023-08-25 NOTE — Therapy (Signed)
 OUTPATIENT PHYSICAL THERAPY THORACOLUMBAR EVALUATION   Patient Name: Carlos Neal. MRN: [id number] DOB:Apr 01, 1940, 83 y.o., male Today's Date: 08/27/2023  END OF SESSION:  PT End of Session - 08/26/23 1411     Visit Number 1    Number of Visits 24    Date for PT Re-Evaluation 11/18/23    Progress Note Due on Visit 10    PT Start Time 1315    PT Stop Time 1359    PT Time Calculation (min) 44 min    Equipment Utilized During Treatment Gait belt    Activity Tolerance Patient tolerated treatment well;No increased pain    Behavior During Therapy WFL for tasks assessed/performed          Past Medical History:  Diagnosis Date   Anemia    Asthma    as a child   GERD (gastroesophageal reflux disease)    Hypertension    Osteoarthritis    back and knee   Osteopenia    Osteoporosis    Vitamin D  deficiency    Past Surgical History:  Procedure Laterality Date   BROW LIFT Bilateral 09/23/2020   Procedure: BLEPHAROPTOSIS REPAIR; RESECT EX  BROW PTOSIS REPAIR ECTROPION REPAIR, EXTENSIVE BILATERAL;  Surgeon: Ashley Greig HERO, MD;  Location: [facility];  Service: Ophthalmology;  Laterality: Bilateral;  wants to be later in the schedule   COLONOSCOPY  01/12/2013   COLONOSCOPY N/A 01/05/2022   Procedure: COLONOSCOPY;  Surgeon: Toledo, Ladell POUR, MD;  Location: [facility];  Service: Gastroenterology;  Laterality: N/A;   ESOPHAGOGASTRODUODENOSCOPY (EGD) WITH PROPOFOL  N/A 01/05/2022   Procedure: ESOPHAGOGASTRODUODENOSCOPY (EGD) WITH PROPOFOL ;  Surgeon: Toledo, Ladell POUR, MD;  Location: [facility];  Service: Gastroenterology;  Laterality: N/A;   GIVENS CAPSULE STUDY N/A 01/05/2022   Procedure: GIVENS CAPSULE STUDY;  Surgeon: Toledo, Ladell POUR, MD;  Location: [facility];  Service: Gastroenterology;  Laterality: N/A;   HYDROCELE EXCISION     age 74   INGUINAL HERNIA REPAIR Bilateral    as a child   KNEE ARTHROPLASTY Right 01/30/2021   Procedure: COMPUTER ASSISTED TOTAL KNEE  ARTHROPLASTY;  Surgeon: Mardee Lynwood SQUIBB, MD;  Location: [facility];  Service: Orthopedics;  Laterality: Right;   LUMBAR LAMINECTOMY/ DECOMPRESSION WITH MET-RX Right 01/15/2020   Procedure: L2-3 DECOMPRESSION, RIGHT L2-3 FAR LATERAL DISCECTOMY;  Surgeon: Clois Fret, MD;  Location: [facility];  Service: Neurosurgery;  Laterality: Right;  2nd case   TOTAL HIP ARTHROPLASTY Left 12/04/2021   Procedure: TOTAL HIP ARTHROPLASTY;  Surgeon: Mardee Lynwood SQUIBB, MD;  Location: [facility];  Service: Orthopedics;  Laterality: Left;   Patient Active Problem List   Diagnosis Date Noted   IDA (iron  deficiency anemia) 01/11/2022   AVM (arteriovenous malformation) 01/11/2022   Symptomatic anemia 01/03/2022   Positive D dimer 01/03/2022   H/O total hip arthroplasty, left 12/04/21 12/04/2021   Chronic left shoulder pain 11/09/2021   Primary osteoarthritis of left hip 08/22/2021   Status post total right knee replacement 01/30/2021   B12 deficiency 03/16/2020   BPH associated with nocturia 03/16/2020   Chronic pain syndrome 12/23/2019   Pharmacologic therapy 12/23/2019   Disorder of skeletal system 12/23/2019   Problems influencing health status 12/23/2019   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HCC) 12/23/2019   Chronic lower extremity pain (1ry area of Pain) (Right) 12/23/2019   Chronic low back pain (2ry area of Pain) (Bilateral) (R>L) w/ sciatica (Right) 12/23/2019   Abnormal MRI, lumbar spine (12/04/2019) 12/23/2019   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) 12/23/2019  Retrolisthesis (T12-L1, L1-2) 12/23/2019   Lumbosacral foraminal stenosis 12/23/2019   Lumbar facet arthropathy 12/23/2019   Osteoarthritis of facet joint of lumbar spine 12/23/2019   Lumbar facet syndrome (Bilateral) 12/23/2019   Wedge compression fracture of T12 vertebra, sequela 12/23/2019   DDD (degenerative disc disease), lumbosacral 12/23/2019   DDD (degenerative disc disease), cervical 12/23/2019   Cervical facet syndrome (Right)  12/23/2019   Cervicalgia 12/23/2019   Chronic neck pain (Right) 12/23/2019   Essential hypertension 12/20/2019   GERD without esophagitis 12/20/2019   Hereditary hemochromatosis (HCC) 12/20/2019   Impotence 12/20/2019   Insomnia 12/20/2019   Osteopenia 12/20/2019   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 Alda Carpen  REFERRING PROVIDER: Dr. Alda Carpen  REFERRING DIAG:  [phone] (ICD-10-CM) - Spinal stenosis, lumbar region, without neurogenic claudication  R26.81 (ICD-10-CM) - Unsteadiness on feet    Rationale for Evaluation and Treatment: Rehabilitation  THERAPY DIAG:  Abnormality of gait and mobility - Plan: PT plan of care cert/re-cert  Difficulty in walking, not elsewhere classified - Plan: PT plan of care cert/re-cert  Other low back pain - Plan: PT plan of care cert/re-cert  Muscle weakness (generalized) - Plan: PT plan of care cert/re-cert  Other abnormalities of gait and mobility - Plan: PT plan of care cert/re-cert  Other lack of coordination - Plan: PT plan of care cert/re-cert  Unsteadiness on feet - Plan: PT plan of care cert/re-cert  ONSET DATE: 2022  SUBJECTIVE:                                                                                                                                                                                           SUBJECTIVE STATEMENT: I just can't get my walking like I want to. I'm unsteady and have had recent hip, knee and back surgeries back in 2022 and 2023. My main concern is to get my walking better.   PERTINENT HISTORY:  Per most recent PCP Visit note on 08/20/2023: Spinal stenosis of lumbar region without neurogenic  claudication Slightly improved but still symptomatic. Patient with unstable gait and lack of strength. Plan to refer to physical therapy for evaluation   PAIN:  Are you having pain? Yes: NPRS scale: current 0/10; worst=6/10 Pain location: Right lower back Pain description: ache and sharp with walking Aggravating factors: getting up in morning, walking in morning-does improve throughout the day Relieving factors: Tylenol ,  heat  PRECAUTIONS: Fall  RED FLAGS: Bowel or bladder incontinence: Yes:     WEIGHT BEARING RESTRICTIONS: No  FALLS:  Has patient fallen in last 6 months? No  LIVING ENVIRONMENT: Lives with: lives with their spouse Lives in: House/apartment Stairs: Yes: External: 3 steps; can reach both Has following equipment at home: Single point cane, Walker - 2 wheeled, and Grab bars  OCCUPATION: Retired - 2019.  [employer] and worked at [employer] prior to that.   PLOF: Independent  PATIENT GOALS: Improve my walking  NEXT MD VISIT: Dr. Babara (hematologist)   OBJECTIVE:  Note: Objective measures were completed at Evaluation unless otherwise noted.  DIAGNOSTIC FINDINGS:  CLINICAL DATA:  Lumbar surgery L2-3 on 01/15/2020. Continue low back pain with right upper leg pain.   EXAM: MRI LUMBAR SPINE WITHOUT AND WITH CONTRAST   TECHNIQUE: Multiplanar and multiecho pulse sequences of the lumbar spine were obtained without and with intravenous contrast.   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   2 mm retrolisthesis L1-2. 4 mm retrolisthesis T12-L1. 4 mm retrolisthesis L1-2. Mild anterolisthesis L4-5.   Vertebrae: Negative for fracture or mass. Discogenic edema L1-2 and L2-3 similar to the prior study.   Conus medullaris and cauda equina: Conus extends to the T12-L1 level. Conus and cauda equina appear  normal.   Paraspinal and other soft tissues: Negative for paraspinous mass. Interval resolution of left paraspinous cyst at the mid L3 level lateral to the foramen.   Disc levels:   T10-11: Probable left foraminal encroachment due to spurring. No axial images through this level   T11-12: Left paracentral disc protrusion, with progression. Bilateral facet degeneration. Negative for stenosis   T12-L1: Disc degeneration with disc bulging and spurring. No significant stenosis   L1-2: Asymmetric disc degeneration on the right with disc space narrowing and spurring. Bilateral facet hypertrophy. Spinal canal adequate in size. Moderate to severe right subarticular stenosis. Mild left subarticular stenosis. No interval change.   L2-3: Right laminectomy. 12 mm multiloculated fluid collection posterior to the lamina related to recent surgery. Severe facet degeneration. Advanced disc degeneration with disc space narrowing and endplate spurring. Spinal canal now adequate in size with mild narrowing. Moderate to severe subarticular and foraminal stenosis bilaterally right greater than left due to spurring is similar to the prior study.   L3-4: Diffuse disc bulging and bilateral facet degeneration. Mild subarticular stenosis bilaterally.   L4-5: Mild anterolisthesis. Asymmetric disc degeneration on the left with disc space narrowing and spurring. Advanced facet degeneration bilaterally. Moderate subarticular and foraminal stenosis on the left. Mild to moderate right subarticular stenosis   L5-S1: Bilateral facet degeneration. Mild right subarticular stenosis due to spurring.   IMPRESSION: Multilevel degenerative change throughout the lumbar spine most severe at L1-2 and L2-3.   Interval right laminectomy at L2-3 with improvement in canal stenosis. There remains moderate to severe subarticular stenosis bilaterally right greater than left. Interval resolution of cyst lateral to the L3  vertebral body.     Electronically Signed   By: Carlin Gaskins M.D.   On: 04/04/2020 11:52    PATIENT SURVEYS:  ABC scale: The Activities-Specific Balance Confidence (ABC) Scale 0% 10 20 30  40 50 60 70 80 90 100% No confidence<->completely confident  "How confident are you that you will not lose your balance or become unsteady when  you . . .   Date tested 08/26/2023  Walk around the house 90%  2. Walk up or down stairs 50%  3. Bend over and pick up a slipper from in front of a closet floor 50%  4. Reach for a small can off a shelf at eye level 70%  5. Stand on tip toes and reach for something above your head 40%  6. Stand on a chair and reach for something 10%  7. Sweep the floor 50%  8. Walk outside the house to a car parked in the driveway 90%  9. Get into or out of a car 80%  10. Walk across a parking lot to the mall 70%  11. Walk up or down a ramp 60%  12. Walk in a crowded mall where people rapidly walk past you 70%  13. Are bumped into by people as you walk through the mall 70%  14. Step onto or off of an escalator while you are holding onto the railing 80%  15. Step onto or off an escalator while holding onto parcels such that you cannot hold onto the railing 50%  16. Walk outside on icy sidewalks 10%  Total: #/16 58.75%    Modified Oswestry:  MODIFIED OSWESTRY DISABILITY SCALE  Date: 08/26/2023 Score  Pain intensity 3 =  Pain medication provides me with moderate relief from pain.  2. Personal care (washing, dressing, etc.) 3 =  I need help, but I am able to manage most of my personal care.  3. Lifting 3 = Pain prevents me from lifting heavy weights, but I can manage (5) I have hardly any social life because of my pain. light to medium weights if they are conveniently positioned  4. Walking 2 =  Pain prevents me from walking more than  mile.  5. Sitting 2 =  Pain prevents me from sitting more than 1 hour.  6. Standing 1 =  I can stand as long as I want but, it increases  my pain.  7. Sleeping 0 = Pain does not prevent me from sleeping well.  8. Social Life 0 = My social life is normal and does not increase my pain.  9. Traveling 2 =  My pain restricts my travel over 2 hours.  10. Employment/ Homemaking 1 = My normal homemaking/job activities increase my pain, but I can still perform all that is required of me  Total 17/50=24%   Interpretation of scores: Score Category Description  0-20% Minimal Disability The patient can cope with most living activities. Usually no treatment is indicated apart from advice on lifting, sitting and exercise  21-40% Moderate Disability The patient experiences more pain and difficulty with sitting, lifting and standing. Travel and social life are more difficult and they may be disabled from work. Personal care, sexual activity and sleeping are not grossly affected, and the patient can usually be managed by conservative means  41-60% Severe Disability Pain remains the main problem in this group, but activities of daily living are affected. These patients require a detailed investigation  61-80% Crippled Back pain impinges on all aspects of the patient's life. Positive intervention is required  81-100% Bed-bound  These patients are either bed-bound or exaggerating their symptoms  Bluford FORBES Zoe DELENA Karon DELENA, et al. Surgery versus conservative management of stable thoracolumbar fracture: the PRESTO feasibility RCT. [place] ([place]): [employer]; 2021 Nov. [facility] Technology Assessment, No. 25.62.) Appendix 3, Oswestry Disability Index category descriptors. Available from: FindJewelers.cz  Minimally  Clinically Important Difference (MCID) = 12.8%  COGNITION: Overall cognitive status: Within functional limits for tasks assessed     SENSATION: WFL   POSTURE: rounded shoulders and forward head  PALPATION: (+) pain in right lumbar paraspinals  LUMBAR ROM:   AROM eval  Flexion Finger tip  to toes with knees straight  Extension WNL  Right lateral flexion Finger tips to mid fibula   Left lateral flexion Finger tips to mid   Right rotation   Left rotation    (Blank rows = not tested)  LOWER EXTREMITY ROM:     Active  Right eval Left eval  Hip flexion    Hip extension    Hip abduction    Hip adduction    Hip internal rotation    Hip external rotation    Knee flexion    Knee extension    Ankle dorsiflexion    Ankle plantarflexion    Ankle inversion    Ankle eversion     (Blank rows = not tested)  LOWER EXTREMITY MMT:    MMT Right eval Left eval  Hip flexion 4 4  Hip extension 4 4  Hip abduction 4 4  Hip adduction    Hip internal rotation 4 4  Hip external rotation 4 4  Knee flexion 4+ 4+  Knee extension 4+ 4+  Ankle dorsiflexion 5 5  Ankle plantarflexion    Ankle inversion    Ankle eversion     (Blank rows = not tested)  LUMBAR SPECIAL TESTS:  Slump test: Negative and SI Compression/distraction test: Negative  FUNCTIONAL TESTS:  5 times sit to stand: 14.07 sec without UE Timed up and go (TUG): 14.34 sec Avg 6 minute walk test: To be assessed next visit 10 meter walk test: 0.74 m/s avg Berg Balance Scale: To be assessed visit #2  GAIT: Distance walked: 80 feet Assistive device utilized: None Level of assistance: SBA Comments: Decreased step length, narrowed BOS, forward flexed posture.   TREATMENT DATE: 08/26/2023  PT EVALUATION:       Self care/Home management:  - Instructed in a couple of self stretches along with pain management strategies- including heat, meds, and movement strategies.                                                                                                                              PATIENT EDUCATION:  Education details: Purpose of PT; Plan of care; Anatomy of lumbar spine; Instruction in HEP Person educated: Patient Education method: Explanation, Demonstration, Tactile cues, Verbal cues, and  Handouts Education comprehension: verbalized understanding, returned demonstration, verbal cues required, tactile cues required, and needs further education  HOME EXERCISE PROGRAM: Access Code: [email or web address] URL: [email or web address] Date: 08/26/2023 Prepared by: Reyes London  Exercises - Seated Lumbar Flexion Stretch  - 1 x daily - 3 sets - 30 hold - Seated Hamstring Stretch  - 1 x daily - 3 sets - 30 hold  ASSESSMENT:  CLINICAL  IMPRESSION: Patient is a 83 y.o. male who was seen today for physical therapy evaluation and treatment for Spinal stenosis of lumbar region and unsteadiness on feet. PT examination reveals deficits . Pt presents with deficits in strength, mobility, range of motion, and pain. Will further assess balance next visit and add goals as appropriate. Pt will benefit from skilled PT services to address deficits and return to pain-free function at home and work.   OBJECTIVE IMPAIRMENTS: Abnormal gait, decreased activity tolerance, decreased balance, decreased coordination, decreased endurance, decreased mobility, difficulty walking, decreased ROM, decreased strength, hypomobility, postural dysfunction, and pain.   ACTIVITY LIMITATIONS: carrying, lifting, bending, sitting, standing, squatting, sleeping, stairs, transfers, and bed mobility  PARTICIPATION LIMITATIONS: meal prep, cleaning, laundry, shopping, community activity, and yard work  PERSONAL FACTORS: Age, Time since onset of injury/illness/exacerbation, and 3+ comorbidities: OA, DDD  Previous surgeries,  are also affecting patient's functional outcome.   REHAB POTENTIAL: Good  CLINICAL DECISION MAKING: Evolving/moderate complexity  EVALUATION COMPLEXITY: Moderate   GOALS: Goals reviewed with patient? Yes  SHORT TERM GOALS: Target date: 10/07/2023  Pt will be independent with HEP in order to improve strength and decrease back pain in order to improve pain-free function at home. Baseline:  EVAL= No formal HEP in place Goal status: INITIAL   LONG TERM GOALS: Target date: 11/18/2023  Pt will decrease mODI score by at least 8 points in order demonstrate clinically significant reduction in back pain/disability. Baseline: EVAL = 24 Goal status: INITIAL  2.  Pt will decrease worst back pain as reported on NPRS by at least 2 points in order to demonstrate clinically significant reduction in back pain.  Baseline: Worst low back pain=6/10 Goal status: INITIAL  3.  Pt will improve BERG by at least 3 points in order to demonstrate clinically significant improvement in balance.  Baseline: To be assessed visit #2 Goal status: INITIAL  4.  Pt will improve ABC by at least 13% in order to demonstrate clinically significant improvement in balance confidence Baseline: EVAL= 58.75% Goal status: INITIAL  5.  Pt will decrease 5TSTS by at least 3 seconds in order to demonstrate clinically significant improvement in LE strength. Baseline: 14.07 sec without UE Goal status: INITIAL  6.  Pt will increase by at least 48m (152ft) in order to demonstrate clinically significant improvement in cardiopulmonary endurance and community ambulation  Baseline: To be assessed visit #2 Goal status: INITIAL  PLAN:  PT FREQUENCY: 1-2x/week  PT DURATION: 12 weeks  PLANNED INTERVENTIONS: [id number]- PT Re-evaluation, 97750- Physical Performance Testing, 97110-Therapeutic exercises, 97530- Therapeutic activity, [id number]- Neuromuscular re-education, 97535- Self Care, [id number]- Manual therapy, [id number]- Gait training, [phone]- Orthotic Initial, [phone]- Orthotic/Prosthetic subsequent, [phone]- Canalith repositioning, [phone]- Electrical stimulation (manual), [phone] (1-2 muscles), [phone] (3+ muscles)- Dry Needling, Patient/Family education, Balance training, Stair training, Taping, Joint mobilization, Joint manipulation, Spinal manipulation, Spinal mobilization, Vestibular training, DME instructions, Cryotherapy, and Moist  heat.  PLAN FOR NEXT SESSION:  - BERG -6 min walk test -Low back/LE stretching -Core stabilization (Add any balance, LE/low back/core strengthening to HEP)   Reyes LOISE London, PT 08/27/2023, 8:34 PM

## 2023-08-26 ENCOUNTER — Ambulatory Visit: Attending: Family Medicine

## 2023-08-26 DIAGNOSIS — R269 Unspecified abnormalities of gait and mobility: Secondary | ICD-10-CM | POA: Diagnosis present

## 2023-08-26 DIAGNOSIS — M6281 Muscle weakness (generalized): Secondary | ICD-10-CM | POA: Insufficient documentation

## 2023-08-26 DIAGNOSIS — M5459 Other low back pain: Secondary | ICD-10-CM | POA: Insufficient documentation

## 2023-08-26 DIAGNOSIS — R2681 Unsteadiness on feet: Secondary | ICD-10-CM | POA: Insufficient documentation

## 2023-08-26 DIAGNOSIS — R278 Other lack of coordination: Secondary | ICD-10-CM | POA: Insufficient documentation

## 2023-08-26 DIAGNOSIS — R2689 Other abnormalities of gait and mobility: Secondary | ICD-10-CM | POA: Insufficient documentation

## 2023-08-26 DIAGNOSIS — R262 Difficulty in walking, not elsewhere classified: Secondary | ICD-10-CM | POA: Diagnosis present

## 2023-08-28 ENCOUNTER — Ambulatory Visit: Admitting: Physical Therapy

## 2023-08-28 DIAGNOSIS — R2681 Unsteadiness on feet: Secondary | ICD-10-CM

## 2023-08-28 DIAGNOSIS — M5459 Other low back pain: Secondary | ICD-10-CM

## 2023-08-28 DIAGNOSIS — M6281 Muscle weakness (generalized): Secondary | ICD-10-CM

## 2023-08-28 DIAGNOSIS — R262 Difficulty in walking, not elsewhere classified: Secondary | ICD-10-CM

## 2023-08-28 DIAGNOSIS — R269 Unspecified abnormalities of gait and mobility: Secondary | ICD-10-CM

## 2023-08-28 DIAGNOSIS — R2689 Other abnormalities of gait and mobility: Secondary | ICD-10-CM

## 2023-08-28 DIAGNOSIS — R278 Other lack of coordination: Secondary | ICD-10-CM

## 2023-08-28 NOTE — Therapy (Signed)
 OUTPATIENT PHYSICAL THERAPY THORACOLUMBAR treatment   Patient Name: Carlos Neal. MRN: [id number] DOB:[birth year], 83 y.o., male Today's Date: 08/28/2023  END OF SESSION:  PT End of Session - 08/28/23 1109     Visit Number 2    Number of Visits 24    Date for PT Re-Evaluation 11/18/23    Progress Note Due on Visit 10    PT Start Time 1107    PT Stop Time 1146    PT Time Calculation (min) 39 min    Equipment Utilized During Treatment Gait belt    Activity Tolerance Patient tolerated treatment well;No increased pain    Behavior During Therapy WFL for tasks assessed/performed          Past Medical History:  Diagnosis Date   Anemia    Asthma    as a child   GERD (gastroesophageal reflux disease)    Hypertension    Osteoarthritis    back and knee   Osteopenia    Osteoporosis    Vitamin D  deficiency    Past Surgical History:  Procedure Laterality Date   BROW LIFT Bilateral 09/23/2020   Procedure: BLEPHAROPTOSIS REPAIR; RESECT EX  BROW PTOSIS REPAIR ECTROPION REPAIR, EXTENSIVE BILATERAL;  Surgeon: Ashley Greig HERO, MD;  Location: [facility];  Service: Ophthalmology;  Laterality: Bilateral;  wants to be later in the schedule   COLONOSCOPY  01/12/2013   COLONOSCOPY N/A 01/05/2022   Procedure: COLONOSCOPY;  Surgeon: Toledo, Ladell POUR, MD;  Location: [facility];  Service: Gastroenterology;  Laterality: N/A;   ESOPHAGOGASTRODUODENOSCOPY (EGD) WITH PROPOFOL  N/A 01/05/2022   Procedure: ESOPHAGOGASTRODUODENOSCOPY (EGD) WITH PROPOFOL ;  Surgeon: Toledo, Ladell POUR, MD;  Location: [facility];  Service: Gastroenterology;  Laterality: N/A;   GIVENS CAPSULE STUDY N/A 01/05/2022   Procedure: GIVENS CAPSULE STUDY;  Surgeon: Toledo, Ladell POUR, MD;  Location: [facility];  Service: Gastroenterology;  Laterality: N/A;   HYDROCELE EXCISION     age 89   INGUINAL HERNIA REPAIR Bilateral    as a child   KNEE ARTHROPLASTY Right 01/30/2021   Procedure: COMPUTER ASSISTED TOTAL KNEE  ARTHROPLASTY;  Surgeon: Mardee Lynwood SQUIBB, MD;  Location: [facility];  Service: Orthopedics;  Laterality: Right;   LUMBAR LAMINECTOMY/ DECOMPRESSION WITH MET-RX Right 01/15/2020   Procedure: L2-3 DECOMPRESSION, RIGHT L2-3 FAR LATERAL DISCECTOMY;  Surgeon: Clois Fret, MD;  Location: [facility];  Service: Neurosurgery;  Laterality: Right;  2nd case   TOTAL HIP ARTHROPLASTY Left 12/04/2021   Procedure: TOTAL HIP ARTHROPLASTY;  Surgeon: Mardee Lynwood SQUIBB, MD;  Location: [facility];  Service: Orthopedics;  Laterality: Left;   Patient Active Problem List   Diagnosis Date Noted   IDA (iron  deficiency anemia) 01/11/2022   AVM (arteriovenous malformation) 01/11/2022   Symptomatic anemia 01/03/2022   Positive D dimer 01/03/2022   H/O total hip arthroplasty, left 12/04/21 12/04/2021   Chronic left shoulder pain 11/09/2021   Primary osteoarthritis of left hip 08/22/2021   Status post total right knee replacement 01/30/2021   B12 deficiency 03/16/2020   BPH associated with nocturia 03/16/2020   Chronic pain syndrome 12/23/2019   Pharmacologic therapy 12/23/2019   Disorder of skeletal system 12/23/2019   Problems influencing health status 12/23/2019   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HCC) 12/23/2019   Chronic lower extremity pain (1ry area of Pain) (Right) 12/23/2019   Chronic low back pain (2ry area of Pain) (Bilateral) (R>L) w/ sciatica (Right) 12/23/2019   Abnormal MRI, lumbar spine (12/04/2019) 12/23/2019   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) 12/23/2019  Retrolisthesis (T12-L1, L1-2) 12/23/2019   Lumbosacral foraminal stenosis 12/23/2019   Lumbar facet arthropathy 12/23/2019   Osteoarthritis of facet joint of lumbar spine 12/23/2019   Lumbar facet syndrome (Bilateral) 12/23/2019   Wedge compression fracture of T12 vertebra, sequela 12/23/2019   DDD (degenerative disc disease), lumbosacral 12/23/2019   DDD (degenerative disc disease), cervical 12/23/2019   Cervical facet syndrome (Right)  12/23/2019   Cervicalgia 12/23/2019   Chronic neck pain (Right) 12/23/2019   Essential hypertension 12/20/2019   GERD without esophagitis 12/20/2019   Hereditary hemochromatosis (HCC) 12/20/2019   Impotence 12/20/2019   Insomnia 12/20/2019   Osteopenia 12/20/2019   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 Alda Carpen  REFERRING PROVIDER: Dr. Alda Carpen  REFERRING DIAG:  [phone] (ICD-10-CM) - Spinal stenosis, lumbar region, without neurogenic claudication  R26.81 (ICD-10-CM) - Unsteadiness on feet    Rationale for Evaluation and Treatment: Rehabilitation  THERAPY DIAG:  Abnormality of gait and mobility  Difficulty in walking, not elsewhere classified  Other low back pain  Muscle weakness (generalized)  Other abnormalities of gait and mobility  Other lack of coordination  Unsteadiness on feet  ONSET DATE: 2022  SUBJECTIVE:                                                                                                                                                                                           SUBJECTIVE STATEMENT: Pt reports that he is doing well, but still feeling stiff. Reports that he felt it was better to use vale today, so he didn't have to walk all the way from the parking lot.  Will be getting shots in shoulder for pain management next Wednesday.   From EVALUATION:  I just can't get my walking like I want to. I'm unsteady and have had recent hip, knee and back surgeries back in 2022 and 2023. My main concern is to get my walking better.   PERTINENT HISTORY:  Per most recent PCP Visit note on 08/20/2023: Spinal stenosis of lumbar region without  neurogenic claudication Slightly improved but still symptomatic. Patient with unstable gait and lack of strength. Plan to refer to physical therapy for evaluation   PAIN:  Are you having pain? Yes: NPRS scale: current 0/10; worst=6/10 Pain location: Right lower back Pain description: ache and sharp with walking Aggravating factors: getting up in morning, walking in morning-does improve throughout the day  Relieving factors: Tylenol , heat  PRECAUTIONS: Fall  RED FLAGS: Bowel or bladder incontinence: Yes:     WEIGHT BEARING RESTRICTIONS: No  FALLS:  Has patient fallen in last 6 months? No  LIVING ENVIRONMENT: Lives with: lives with their spouse Lives in: House/apartment Stairs: Yes: External: 3 steps; can reach both Has following equipment at home: Single point cane, Walker - 2 wheeled, and Grab bars  OCCUPATION: Retired - 2019.  [employer] and worked at [employer] prior to that.   PLOF: Independent  PATIENT GOALS: Improve my walking  NEXT MD VISIT: Dr. Babara (hematologist)   OBJECTIVE:  Note: Objective measures were completed at Evaluation unless otherwise noted.  DIAGNOSTIC FINDINGS:  CLINICAL DATA:  Lumbar surgery L2-3 on 01/15/2020. Continue low back pain with right upper leg pain.   EXAM: MRI LUMBAR SPINE WITHOUT AND WITH CONTRAST   TECHNIQUE: Multiplanar and multiecho pulse sequences of the lumbar spine were obtained without and with intravenous contrast.   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   2 mm retrolisthesis L1-2. 4 mm retrolisthesis T12-L1. 4 mm retrolisthesis L1-2. Mild anterolisthesis L4-5.   Vertebrae: Negative for fracture or mass. Discogenic edema L1-2 and L2-3 similar to the prior study.   Conus medullaris and cauda equina: Conus extends to the T12-L1 level. Conus and cauda  equina appear normal.   Paraspinal and other soft tissues: Negative for paraspinous mass. Interval resolution of left paraspinous cyst at the mid L3 level lateral to the foramen.   Disc levels:   T10-11: Probable left foraminal encroachment due to spurring. No axial images through this level   T11-12: Left paracentral disc protrusion, with progression. Bilateral facet degeneration. Negative for stenosis   T12-L1: Disc degeneration with disc bulging and spurring. No significant stenosis   L1-2: Asymmetric disc degeneration on the right with disc space narrowing and spurring. Bilateral facet hypertrophy. Spinal canal adequate in size. Moderate to severe right subarticular stenosis. Mild left subarticular stenosis. No interval change.   L2-3: Right laminectomy. 12 mm multiloculated fluid collection posterior to the lamina related to recent surgery. Severe facet degeneration. Advanced disc degeneration with disc space narrowing and endplate spurring. Spinal canal now adequate in size with mild narrowing. Moderate to severe subarticular and foraminal stenosis bilaterally right greater than left due to spurring is similar to the prior study.   L3-4: Diffuse disc bulging and bilateral facet degeneration. Mild subarticular stenosis bilaterally.   L4-5: Mild anterolisthesis. Asymmetric disc degeneration on the left with disc space narrowing and spurring. Advanced facet degeneration bilaterally. Moderate subarticular and foraminal stenosis on the left. Mild to moderate right subarticular stenosis   L5-S1: Bilateral facet degeneration. Mild right subarticular stenosis due to spurring.   IMPRESSION: Multilevel degenerative change throughout the lumbar spine most severe at L1-2 and L2-3.   Interval right laminectomy at L2-3 with improvement in canal stenosis. There remains moderate to severe subarticular stenosis bilaterally right greater than left. Interval resolution of  cyst lateral to the L3 vertebral body.     Electronically Signed   By: Carlin Gaskins M.D.   On: 04/04/2020 11:52    PATIENT SURVEYS:  ABC scale: The Activities-Specific Balance Confidence (ABC) Scale 0% 10 20 30  40 50 60 70 80 90 100% No confidence<->completely confident  "How confident are you that you will not lose your balance or  become unsteady when you . . .   Date tested 08/26/2023  Walk around the house 90%  2. Walk up or down stairs 50%  3. Bend over and pick up a slipper from in front of a closet floor 50%  4. Reach for a small can off a shelf at eye level 70%  5. Stand on tip toes and reach for something above your head 40%  6. Stand on a chair and reach for something 10%  7. Sweep the floor 50%  8. Walk outside the house to a car parked in the driveway 90%  9. Get into or out of a car 80%  10. Walk across a parking lot to the mall 70%  11. Walk up or down a ramp 60%  12. Walk in a crowded mall where people rapidly walk past you 70%  13. Are bumped into by people as you walk through the mall 70%  14. Step onto or off of an escalator while you are holding onto the railing 80%  15. Step onto or off an escalator while holding onto parcels such that you cannot hold onto the railing 50%  16. Walk outside on icy sidewalks 10%  Total: #/16 58.75%    Modified Oswestry:  MODIFIED OSWESTRY DISABILITY SCALE  Date: 08/26/2023 Score  Pain intensity 3 =  Pain medication provides me with moderate relief from pain.  2. Personal care (washing, dressing, etc.) 3 =  I need help, but I am able to manage most of my personal care.  3. Lifting 3 = Pain prevents me from lifting heavy weights, but I can manage (5) I have hardly any social life because of my pain. light to medium weights if they are conveniently positioned  4. Walking 2 =  Pain prevents me from walking more than  mile.  5. Sitting 2 =  Pain prevents me from sitting more than 1 hour.  6. Standing 1 =  I can stand as long as  I want but, it increases my pain.  7. Sleeping 0 = Pain does not prevent me from sleeping well.  8. Social Life 0 = My social life is normal and does not increase my pain.  9. Traveling 2 =  My pain restricts my travel over 2 hours.  10. Employment/ Homemaking 1 = My normal homemaking/job activities increase my pain, but I can still perform all that is required of me  Total 17/50=24%   Interpretation of scores: Score Category Description  0-20% Minimal Disability The patient can cope with most living activities. Usually no treatment is indicated apart from advice on lifting, sitting and exercise  21-40% Moderate Disability The patient experiences more pain and difficulty with sitting, lifting and standing. Travel and social life are more difficult and they may be disabled from work. Personal care, sexual activity and sleeping are not grossly affected, and the patient can usually be managed by conservative means  41-60% Severe Disability Pain remains the main problem in this group, but activities of daily living are affected. These patients require a detailed investigation  61-80% Crippled Back pain impinges on all aspects of the patient's life. Positive intervention is required  81-100% Bed-bound  These patients are either bed-bound or exaggerating their symptoms  Bluford FORBES Zoe DELENA Karon DELENA, et al. Surgery versus conservative management of stable thoracolumbar fracture: the PRESTO feasibility RCT. [place] ([place]): [employer]; 2021 Nov. [facility] Technology Assessment, No. 25.62.) Appendix 3, Oswestry Disability Index category descriptors. Available from:  FindJewelers.cz  Minimally Clinically Important Difference (MCID) = 12.8%  COGNITION: Overall cognitive status: Within functional limits for tasks assessed     SENSATION: WFL   POSTURE: rounded shoulders and forward head  PALPATION: (+) pain in right lumbar paraspinals  LUMBAR ROM:   AROM  eval  Flexion Finger tip to toes with knees straight  Extension WNL  Right lateral flexion Finger tips to mid fibula   Left lateral flexion Finger tips to mid   Right rotation   Left rotation    (Blank rows = not tested)  LOWER EXTREMITY ROM:     Active  Right eval Left eval  Hip flexion    Hip extension    Hip abduction    Hip adduction    Hip internal rotation    Hip external rotation    Knee flexion    Knee extension    Ankle dorsiflexion    Ankle plantarflexion    Ankle inversion    Ankle eversion     (Blank rows = not tested)  LOWER EXTREMITY MMT:    MMT Right eval Left eval  Hip flexion 4 4  Hip extension 4 4  Hip abduction 4 4  Hip adduction    Hip internal rotation 4 4  Hip external rotation 4 4  Knee flexion 4+ 4+  Knee extension 4+ 4+  Ankle dorsiflexion 5 5  Ankle plantarflexion    Ankle inversion    Ankle eversion     (Blank rows = not tested)  LUMBAR SPECIAL TESTS:  Slump test: Negative and SI Compression/distraction test: Negative  FUNCTIONAL TESTS:  5 times sit to stand: 14.07 sec without UE Timed up and go (TUG): 14.34 sec Avg 6 minute walk test: 857ft  10 meter walk test: 0.74 m/s avg Berg Balance Scale: 47/56  GAIT: Distance walked: 80 feet Assistive device utilized: None Level of assistance: SBA Comments: Decreased step length, narrowed BOS, forward flexed posture.   TREATMENT DATE: 08/26/2023   Patient demonstrates increased fall risk as noted by score of  47/56 on Berg Balance Scale.  (<36= high risk for falls, close to 100%; 37-45 significant >80%; 46-51 moderate >50%; 52-55 lower >25%)                                                                                                                     [facility] PT Assessment - 08/28/23 0001       Berg Balance Test   Sit to Stand Able to stand without using hands and stabilize independently    Standing Unsupported Able to stand safely 2 minutes    Sitting with Back Unsupported but  Feet Supported on Floor or Stool Able to sit safely and securely 2 minutes    Stand to Sit Sits safely with minimal use of hands    Transfers Able to transfer safely, minor use of hands    Standing Unsupported with Eyes Closed Able to stand 10 seconds safely    Standing Unsupported with Feet  Together Able to place feet together independently and stand 1 minute safely    From Standing, Reach Forward with Outstretched Arm Can reach forward >12 cm safely (5)    From Standing Position, Pick up Object from Floor Able to pick up shoe safely and easily    From Standing Position, Turn to Look Behind Over each Shoulder Turn sideways only but maintains balance    Turn 360 Degrees Able to turn 360 degrees safely one side only in 4 seconds or less    Standing Unsupported, Alternately Place Feet on Step/Stool Able to stand independently and complete 8 steps >20 seconds    Standing Unsupported, One Foot in Front Able to plae foot ahead of the other independently and hold 30 seconds    Standing on One Leg Tries to lift leg/unable to hold 3 seconds but remains standing independently    Total Score 47         6 Min Walk Test:  Instructed patient to ambulate as quickly and as safely as possible for 6 minutes using LRAD. Patient was allowed to take standing rest breaks without stopping the test, but if the patient required a sitting rest break the clock would be stopped and the test would be over.  Results: 838 feet using  no AD  with supervision assist. Results indicate that the patient has reduced endurance with ambulation compared to age matched norms.  Age Matched Norms: 36-69 yo M: 30 F: 31, 57-79 yo M: 49 F: 471, 22-89 yo M: 417 F: 392 MDC: 58.21 meters (190.98 feet) or 50 meters (ANPTA Core Set of Outcome Measures for Adults with Neurologic Conditions, 2018)  HEP advancement:  LTR x 10 bil with 2-3 sec hold  Figure 4 stretch 30sec x 2  Piriformis stretch 2 x 15 sec bil  Bridge x 5 with  diaphragmatic  breathing.    PATIENT EDUCATION:  Education details: Purpose of PT; Plan of care; Anatomy of lumbar spine; Instruction in HEP Person educated: Patient Education method: Explanation, Demonstration, Tactile cues, Verbal cues, and Handouts Education comprehension: verbalized understanding, returned demonstration, verbal cues required, tactile cues required, and needs further education  HOME EXERCISE PROGRAM: Access Code: [email or web address] URL: [email or web address] Date: 08/26/2023 Prepared by: Reyes London  Exercises - Seated Lumbar Flexion Stretch  - 1 x daily - 3 sets - 30 hold - Seated Hamstring Stretch  - 1 x daily - 3 sets - 30 hold  Access Code: [email or web address] URL: [email or web address] Date: 08/28/2023 Prepared by: Massie Dollar  Exercises - Supine Lower Trunk Rotation  - 1 x daily - 7 x weekly - 3 sets - 10 reps - Supine Bridge  - 1 x daily - 7 x weekly - 3 sets - 10 reps - Supine Piriformis Stretch with Foot on Ground  - 1 x daily - 7 x weekly - 3 sets - 10 reps - Supine Figure 4 Piriformis Stretch  - 1 x daily - 7 x weekly - 3 sets - 10 reps  ASSESSMENT:  CLINICAL IMPRESSION: Patient is a 83 y.o. male who was seen today for physical therapy evaluation and treatment for Spinal stenosis of lumbar region and unsteadiness on feet. Completed balance and functional assessment with Breg 47/56 and 6 min walk test limited to 835ft with no AD indicating reduced community access. HEP advanced for LBP management as listed above. Pt will benefit from skilled PT services to address deficits and return to pain-free function at home and work.   OBJECTIVE IMPAIRMENTS: Abnormal  gait, decreased activity tolerance, decreased balance, decreased coordination, decreased endurance, decreased mobility, difficulty walking, decreased ROM, decreased strength, hypomobility, postural dysfunction, and pain.   ACTIVITY LIMITATIONS: carrying, lifting, bending, sitting,  standing, squatting, sleeping, stairs, transfers, and bed mobility  PARTICIPATION LIMITATIONS: meal prep, cleaning, laundry, shopping, community activity, and yard work  PERSONAL FACTORS: Age, Time since onset of injury/illness/exacerbation, and 3+ comorbidities: OA, DDD  Previous surgeries,  are also affecting patient's functional outcome.   REHAB POTENTIAL: Good  CLINICAL DECISION MAKING: Evolving/moderate complexity  EVALUATION COMPLEXITY: Moderate   GOALS: Goals reviewed with patient? Yes  SHORT TERM GOALS: Target date: 10/07/2023  Pt will be independent with HEP in order to improve strength and decrease back pain in order to improve pain-free function at home. Baseline: EVAL= No formal HEP in place Goal status: INITIAL   LONG TERM GOALS: Target date: 11/18/2023  Pt will decrease mODI score by at least 8 points in order demonstrate clinically significant reduction in back pain/disability. Baseline: EVAL = 24 Goal status: INITIAL  2.  Pt will decrease worst back pain as reported on NPRS by at least 2 points in order to demonstrate clinically significant reduction in back pain.  Baseline: Worst low back pain=6/10 Goal status: INITIAL  3.  Pt will improve BERG by at least 3 points in order to demonstrate clinically significant improvement in balance.  Baseline: 47/56  Goal status: INITIAL  4.  Pt will improve ABC by at least 13% in order to demonstrate clinically significant improvement in balance confidence Baseline: EVAL= 58.75% Goal status: INITIAL  5.  Pt will decrease 5TSTS by at least 3 seconds in order to demonstrate clinically significant improvement in LE strength. Baseline: 14.07 sec without UE Goal status: INITIAL  6.  Pt will increase by at least 38m (127ft) in order to demonstrate clinically significant improvement in cardiopulmonary endurance and community ambulation  Baseline: 847ft with no AD Goal status: INITIAL  PLAN:  PT FREQUENCY:  1-2x/week  PT DURATION: 12 weeks  PLANNED INTERVENTIONS: [id number]- PT Re-evaluation, 97750- Physical Performance Testing, 97110-Therapeutic exercises, 97530- Therapeutic activity, [id number]- Neuromuscular re-education, 97535- Self Care, [id number]- Manual therapy, [id number]- Gait training, [phone]- Orthotic Initial, [id number]- Orthotic/Prosthetic subsequent, [phone]- Canalith repositioning, [id number]- Electrical stimulation (manual), [phone] (1-2 muscles), [id number] (3+ muscles)- Dry Needling, Patient/Family education, Balance training, Stair training, Taping, Joint mobilization, Joint manipulation, Spinal manipulation, Spinal mobilization, Vestibular training, DME instructions, Cryotherapy, and Moist heat.  PLAN FOR NEXT SESSION:   -Low back/LE stretching -Core stabilization (Add any balance, LE/low back/core strengthening to HEP)   Massie FORBES Dollar, PT 08/28/2023, 11:09 AM

## 2023-09-03 ENCOUNTER — Other Ambulatory Visit: Payer: Self-pay

## 2023-09-03 ENCOUNTER — Ambulatory Visit: Attending: Family Medicine | Admitting: Physical Therapy

## 2023-09-03 ENCOUNTER — Inpatient Hospital Stay: Attending: Oncology

## 2023-09-03 DIAGNOSIS — R2681 Unsteadiness on feet: Secondary | ICD-10-CM | POA: Diagnosis present

## 2023-09-03 DIAGNOSIS — M6281 Muscle weakness (generalized): Secondary | ICD-10-CM | POA: Insufficient documentation

## 2023-09-03 DIAGNOSIS — D500 Iron deficiency anemia secondary to blood loss (chronic): Secondary | ICD-10-CM

## 2023-09-03 DIAGNOSIS — M5459 Other low back pain: Secondary | ICD-10-CM | POA: Insufficient documentation

## 2023-09-03 DIAGNOSIS — D509 Iron deficiency anemia, unspecified: Secondary | ICD-10-CM | POA: Insufficient documentation

## 2023-09-03 DIAGNOSIS — R2689 Other abnormalities of gait and mobility: Secondary | ICD-10-CM | POA: Insufficient documentation

## 2023-09-03 DIAGNOSIS — R278 Other lack of coordination: Secondary | ICD-10-CM | POA: Insufficient documentation

## 2023-09-03 DIAGNOSIS — Z79899 Other long term (current) drug therapy: Secondary | ICD-10-CM | POA: Insufficient documentation

## 2023-09-03 DIAGNOSIS — R262 Difficulty in walking, not elsewhere classified: Secondary | ICD-10-CM | POA: Diagnosis present

## 2023-09-03 DIAGNOSIS — R269 Unspecified abnormalities of gait and mobility: Secondary | ICD-10-CM | POA: Diagnosis present

## 2023-09-03 LAB — IRON AND TIBC
Iron: 47 ug/dL (ref 45–182)
Saturation Ratios: 15 % — ABNORMAL LOW (ref 17.9–39.5)
TIBC: 319 ug/dL (ref 250–450)
UIBC: 272 ug/dL

## 2023-09-03 LAB — CBC WITH DIFFERENTIAL (CANCER CENTER ONLY)
Abs Immature Granulocytes: 0.04 K/uL (ref 0.00–0.07)
Basophils Absolute: 0 K/uL (ref 0.0–0.1)
Basophils Relative: 1 %
Eosinophils Absolute: 0.4 K/uL (ref 0.0–0.5)
Eosinophils Relative: 5 %
HCT: 40.7 % (ref 39.0–52.0)
Hemoglobin: 13.8 g/dL (ref 13.0–17.0)
Immature Granulocytes: 1 %
Lymphocytes Relative: 25 %
Lymphs Abs: 1.7 K/uL (ref 0.7–4.0)
MCH: 31.9 pg (ref 26.0–34.0)
MCHC: 33.9 g/dL (ref 30.0–36.0)
MCV: 94.2 fL (ref 80.0–100.0)
Monocytes Absolute: 0.7 K/uL (ref 0.1–1.0)
Monocytes Relative: 11 %
Neutro Abs: 4 K/uL (ref 1.7–7.7)
Neutrophils Relative %: 57 %
Platelet Count: 274 K/uL (ref 150–400)
RBC: 4.32 MIL/uL (ref 4.22–5.81)
RDW: 13.8 % (ref 11.5–15.5)
WBC Count: 6.9 K/uL (ref 4.0–10.5)
nRBC: 0 % (ref 0.0–0.2)

## 2023-09-03 LAB — FERRITIN: Ferritin: 10 ng/mL — ABNORMAL LOW (ref 24–336)

## 2023-09-03 LAB — RETIC PANEL
Immature Retic Fract: 8.1 % (ref 2.3–15.9)
RBC.: 4.34 MIL/uL (ref 4.22–5.81)
Retic Count, Absolute: 60.3 K/uL (ref 19.0–186.0)
Retic Ct Pct: 1.4 % (ref 0.4–3.1)
Reticulocyte Hemoglobin: 36.2 pg (ref >27.9)

## 2023-09-03 NOTE — Therapy (Signed)
 OUTPATIENT PHYSICAL THERAPY THORACOLUMBAR treatment   Patient Name: Carlos Neal. MRN: [id number] DOB:[birth year], 83 y.o., male Today's Date: 09/03/2023  END OF SESSION:    PT End of Session - 09/03/23 1149     Visit Number 3    Number of Visits 24    Date for PT Re-Evaluation 11/18/23    Progress Note Due on Visit 10    PT Start Time 1150    PT Stop Time 1235    PT Time Calculation (min) 45 min    Equipment Utilized During Treatment Gait belt    Activity Tolerance Patient tolerated treatment well    Behavior During Therapy WFL for tasks assessed/performed           Past Medical History:  Diagnosis Date   Anemia    Asthma    as a child   GERD (gastroesophageal reflux disease)    Hypertension    Osteoarthritis    back and knee   Osteopenia    Osteoporosis    Vitamin D  deficiency    Past Surgical History:  Procedure Laterality Date   BROW LIFT Bilateral 09/23/2020   Procedure: BLEPHAROPTOSIS REPAIR; RESECT EX  BROW PTOSIS REPAIR ECTROPION REPAIR, EXTENSIVE BILATERAL;  Surgeon: Ashley Greig HERO, MD;  Location: [facility];  Service: Ophthalmology;  Laterality: Bilateral;  wants to be later in the schedule   COLONOSCOPY  01/12/2013   COLONOSCOPY N/A 01/05/2022   Procedure: COLONOSCOPY;  Surgeon: Toledo, Ladell POUR, MD;  Location: [facility];  Service: Gastroenterology;  Laterality: N/A;   ESOPHAGOGASTRODUODENOSCOPY (EGD) WITH PROPOFOL  N/A 01/05/2022   Procedure: ESOPHAGOGASTRODUODENOSCOPY (EGD) WITH PROPOFOL ;  Surgeon: Toledo, Ladell POUR, MD;  Location: [facility];  Service: Gastroenterology;  Laterality: N/A;   GIVENS CAPSULE STUDY N/A 01/05/2022   Procedure: GIVENS CAPSULE STUDY;  Surgeon: Toledo, Ladell POUR, MD;  Location: [facility];  Service: Gastroenterology;  Laterality: N/A;   HYDROCELE EXCISION     age 47   INGUINAL HERNIA REPAIR Bilateral    as a child   KNEE ARTHROPLASTY Right 01/30/2021   Procedure: COMPUTER ASSISTED TOTAL KNEE ARTHROPLASTY;   Surgeon: Mardee Lynwood SQUIBB, MD;  Location: [facility];  Service: Orthopedics;  Laterality: Right;   LUMBAR LAMINECTOMY/ DECOMPRESSION WITH MET-RX Right 01/15/2020   Procedure: L2-3 DECOMPRESSION, RIGHT L2-3 FAR LATERAL DISCECTOMY;  Surgeon: Clois Fret, MD;  Location: [facility];  Service: Neurosurgery;  Laterality: Right;  2nd case   TOTAL HIP ARTHROPLASTY Left 12/04/2021   Procedure: TOTAL HIP ARTHROPLASTY;  Surgeon: Mardee Lynwood SQUIBB, MD;  Location: [facility];  Service: Orthopedics;  Laterality: Left;   Patient Active Problem List   Diagnosis Date Noted   IDA (iron  deficiency anemia) 01/11/2022   AVM (arteriovenous malformation) 01/11/2022   Symptomatic anemia 01/03/2022   Positive D dimer 01/03/2022   H/O total hip arthroplasty, left 12/04/21 12/04/2021   Chronic left shoulder pain 11/09/2021   Primary osteoarthritis of left hip 08/22/2021   Status post total right knee replacement 01/30/2021   B12 deficiency 03/16/2020   BPH associated with nocturia 03/16/2020   Chronic pain syndrome 12/23/2019   Pharmacologic therapy 12/23/2019   Disorder of skeletal system 12/23/2019   Problems influencing health status 12/23/2019   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HCC) 12/23/2019   Chronic lower extremity pain (1ry area of Pain) (Right) 12/23/2019   Chronic low back pain (2ry area of Pain) (Bilateral) (R>L) w/ sciatica (Right) 12/23/2019   Abnormal MRI, lumbar spine (12/04/2019) 12/23/2019   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)  12/23/2019   Retrolisthesis (T12-L1, L1-2) 12/23/2019   Lumbosacral foraminal stenosis 12/23/2019   Lumbar facet arthropathy 12/23/2019   Osteoarthritis of facet joint of lumbar spine 12/23/2019   Lumbar facet syndrome (Bilateral) 12/23/2019   Wedge compression fracture of T12 vertebra, sequela 12/23/2019   DDD (degenerative disc disease), lumbosacral 12/23/2019   DDD (degenerative disc disease), cervical 12/23/2019   Cervical facet syndrome (Right) 12/23/2019    Cervicalgia 12/23/2019   Chronic neck pain (Right) 12/23/2019   Essential hypertension 12/20/2019   GERD without esophagitis 12/20/2019   Hereditary hemochromatosis (HCC) 12/20/2019   Impotence 12/20/2019   Insomnia 12/20/2019   Osteopenia 12/20/2019   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 Alda Carpen  REFERRING PROVIDER: Dr. Alda Carpen  REFERRING DIAG:  [phone] (ICD-10-CM) - Spinal stenosis, lumbar region, without neurogenic claudication  R26.81 (ICD-10-CM) - Unsteadiness on feet    Rationale for Evaluation and Treatment: Rehabilitation  THERAPY DIAG:   Abnormality of gait and mobility  Muscle weakness (generalized)  Unsteadiness on feet  Difficulty in walking, not elsewhere classified  Other abnormalities of gait and mobility  Other low back pain  Other lack of coordination  ONSET DATE: 2022  SUBJECTIVE:                                                                                                                                                                                           SUBJECTIVE STATEMENT:   Pt reports that he is feeling stiff; feels like he is still recovering from hip & knee surgeries. Pt reported that he would like to get his gait pattern normalized as much as possible.   Pt reported that he has been doing some of his exercises, but maybe not as much as he should have. Reported some mild pain in the right foot following seated hamstring stretch from HEP. Pt reports that he feels like exercises have begun to make an improvement in back pain.   Pt reports that he is still planning to get shots in shoulder tomorrow for pain management.    From  EVALUATION:  I just can't get my walking like I want to. I'm unsteady and have had recent hip, knee and back surgeries back in 2022 and 2023. My main concern is to get my walking better.   PERTINENT HISTORY:  Per most recent PCP Visit note on 08/20/2023: Spinal stenosis of lumbar  region without neurogenic claudication Slightly improved but still symptomatic. Patient with unstable gait and lack of strength. Plan to refer to physical therapy for evaluation   PAIN:  Are you having pain? Yes: NPRS scale: current 0/10; worst=6/10 Pain location: Right lower back Pain description: ache and sharp with walking Aggravating factors: getting up in morning, walking in morning-does improve throughout the day Relieving factors: Tylenol , heat  PRECAUTIONS: Fall  RED FLAGS: Bowel or bladder incontinence: Yes:     WEIGHT BEARING RESTRICTIONS: No  FALLS:  Has patient fallen in last 6 months? No  LIVING ENVIRONMENT: Lives with: lives with their spouse Lives in: House/apartment Stairs: Yes: External: 3 steps; can reach both Has following equipment at home: Single point cane, Walker - 2 wheeled, and Grab bars  OCCUPATION: Retired - 2019.  [employer] and worked at [employer] prior to that.   PLOF: Independent  PATIENT GOALS: Improve my walking  NEXT MD VISIT: Dr. Babara (hematologist)   OBJECTIVE:  Note: Objective measures were completed at Evaluation unless otherwise noted.  DIAGNOSTIC FINDINGS:  CLINICAL DATA:  Lumbar surgery L2-3 on 01/15/2020. Continue low back pain with right upper leg pain.   EXAM: MRI LUMBAR SPINE WITHOUT AND WITH CONTRAST   TECHNIQUE: Multiplanar and multiecho pulse sequences of the lumbar spine were obtained without and with intravenous contrast.   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   2 mm  retrolisthesis L1-2. 4 mm retrolisthesis T12-L1. 4 mm retrolisthesis L1-2. Mild anterolisthesis L4-5.   Vertebrae: Negative for fracture or mass. Discogenic edema L1-2 and L2-3 similar to the prior study.   Conus medullaris and cauda equina: Conus extends to the T12-L1 level. Conus and cauda equina appear normal.   Paraspinal and other soft tissues: Negative for paraspinous mass. Interval resolution of left paraspinous cyst at the mid L3 level lateral to the foramen.   Disc levels:   T10-11: Probable left foraminal encroachment due to spurring. No axial images through this level   T11-12: Left paracentral disc protrusion, with progression. Bilateral facet degeneration. Negative for stenosis   T12-L1: Disc degeneration with disc bulging and spurring. No significant stenosis   L1-2: Asymmetric disc degeneration on the right with disc space narrowing and spurring. Bilateral facet hypertrophy. Spinal canal adequate in size. Moderate to severe right subarticular stenosis. Mild left subarticular stenosis. No interval change.   L2-3: Right laminectomy. 12 mm multiloculated fluid collection posterior to the lamina related to recent surgery. Severe facet degeneration. Advanced disc degeneration with disc space narrowing and endplate spurring. Spinal canal now adequate in size with mild narrowing. Moderate to severe subarticular and foraminal stenosis bilaterally right greater than left due to spurring is similar to the prior study.   L3-4: Diffuse disc bulging and bilateral facet degeneration. Mild subarticular stenosis bilaterally.   L4-5: Mild anterolisthesis. Asymmetric disc degeneration on the left with disc space narrowing and spurring. Advanced facet degeneration bilaterally. Moderate subarticular and foraminal stenosis on the left. Mild to moderate right subarticular stenosis   L5-S1: Bilateral facet degeneration. Mild right subarticular stenosis due to spurring.    IMPRESSION: Multilevel degenerative change throughout the lumbar spine most severe at L1-2 and L2-3.   Interval right laminectomy at L2-3 with improvement in canal stenosis. There remains moderate to severe subarticular stenosis bilaterally right greater than left. Interval resolution of cyst lateral  to the L3 vertebral body.     Electronically Signed   By: Carlin Gaskins M.D.   On: 04/04/2020 11:52    PATIENT SURVEYS:  ABC scale: The Activities-Specific Balance Confidence (ABC) Scale 0% 10 20 30  40 50 60 70 80 90 100% No confidence<->completely confident  "How confident are you that you will not lose your balance or become unsteady when you . . .   Date tested 08/26/2023  Walk around the house 90%  2. Walk up or down stairs 50%  3. Bend over and pick up a slipper from in front of a closet floor 50%  4. Reach for a small can off a shelf at eye level 70%  5. Stand on tip toes and reach for something above your head 40%  6. Stand on a chair and reach for something 10%  7. Sweep the floor 50%  8. Walk outside the house to a car parked in the driveway 90%  9. Get into or out of a car 80%  10. Walk across a parking lot to the mall 70%  11. Walk up or down a ramp 60%  12. Walk in a crowded mall where people rapidly walk past you 70%  13. Are bumped into by people as you walk through the mall 70%  14. Step onto or off of an escalator while you are holding onto the railing 80%  15. Step onto or off an escalator while holding onto parcels such that you cannot hold onto the railing 50%  16. Walk outside on icy sidewalks 10%  Total: #/16 58.75%    Modified Oswestry:  MODIFIED OSWESTRY DISABILITY SCALE  Date: 08/26/2023 Score  Pain intensity 3 =  Pain medication provides me with moderate relief from pain.  2. Personal care (washing, dressing, etc.) 3 =  I need help, but I am able to manage most of my personal care.  3. Lifting 3 = Pain prevents me from lifting heavy weights, but I  can manage (5) I have hardly any social life because of my pain. light to medium weights if they are conveniently positioned  4. Walking 2 =  Pain prevents me from walking more than  mile.  5. Sitting 2 =  Pain prevents me from sitting more than 1 hour.  6. Standing 1 =  I can stand as long as I want but, it increases my pain.  7. Sleeping 0 = Pain does not prevent me from sleeping well.  8. Social Life 0 = My social life is normal and does not increase my pain.  9. Traveling 2 =  My pain restricts my travel over 2 hours.  10. Employment/ Homemaking 1 = My normal homemaking/job activities increase my pain, but I can still perform all that is required of me  Total 17/50=24%   Interpretation of scores: Score Category Description  0-20% Minimal Disability The patient can cope with most living activities. Usually no treatment is indicated apart from advice on lifting, sitting and exercise  21-40% Moderate Disability The patient experiences more pain and difficulty with sitting, lifting and standing. Travel and social life are more difficult and they may be disabled from work. Personal care, sexual activity and sleeping are not grossly affected, and the patient can usually be managed by conservative means  41-60% Severe Disability Pain remains the main problem in this group, but activities of daily living are affected. These patients require a detailed investigation  61-80% Crippled Back pain impinges on all  aspects of the patient's life. Positive intervention is required  81-100% Bed-bound  These patients are either bed-bound or exaggerating their symptoms  Bluford FORBES Zoe DELENA Karon DELENA, et al. Surgery versus conservative management of stable thoracolumbar fracture: the PRESTO feasibility RCT. [place] ([place]): [employer]; 2021 Nov. [facility] Technology Assessment, No. 25.62.) Appendix 3, Oswestry Disability Index category descriptors. Available from:  FindJewel[employer].cz  Minimally Clinically Important Difference (MCID) = 12.8%  COGNITION: Overall cognitive status: Within functional limits for tasks assessed     SENSATION: WFL   POSTURE: rounded shoulders and forward head  PALPATION: (+) pain in right lumbar paraspinals  LUMBAR ROM:   AROM eval  Flexion Finger tip to toes with knees straight  Extension WNL  Right lateral flexion Finger tips to mid fibula   Left lateral flexion Finger tips to mid   Right rotation   Left rotation    (Blank rows = not tested)  LOWER EXTREMITY ROM:     Active  Right eval Left eval  Hip flexion    Hip extension    Hip abduction    Hip adduction    Hip internal rotation    Hip external rotation    Knee flexion    Knee extension    Ankle dorsiflexion    Ankle plantarflexion    Ankle inversion    Ankle eversion     (Blank rows = not tested)  LOWER EXTREMITY MMT:    MMT Right eval Left eval  Hip flexion 4 4  Hip extension 4 4  Hip abduction 4 4  Hip adduction    Hip internal rotation 4 4  Hip external rotation 4 4  Knee flexion 4+ 4+  Knee extension 4+ 4+  Ankle dorsiflexion 5 5  Ankle plantarflexion    Ankle inversion    Ankle eversion     (Blank rows = not tested)  LUMBAR SPECIAL TESTS:  Slump test: Negative and SI Compression/distraction test: Negative  FUNCTIONAL TESTS:  5 times sit to stand: 14.07 sec without UE Timed up and go (TUG): 14.34 sec Avg 6 minute walk test: 854ft  10 meter walk test: 0.74 m/s avg Berg Balance Scale: 47/56  GAIT: Distance walked: 80 feet Assistive device utilized: None Level of assistance: SBA Comments: Decreased step length, narrowed BOS, forward flexed posture.   TREATMENT DATE: 09/03/2023   Discussed HEP, POC, & PMH.  Of note, patient reporting some R foot pain with seated hamstring stretch POC to focus on normalizing gait pattern regarding increased upright posture, improve back pain Glute  bridges 3x10 LTR 3x10 each side  Assessed gait:  300' without AD Decreased arm swing & trunk rotation Of note, pt stating that he has been trying to be conscious of having increased upright posture, resulting in guarding. R knee not able to consistently reach terminal extension, resulting in decreased heel strike  Discussed deficits noted with gait. Cont to discuss PMH to include R knee replacement, L hip replacement, back surgery as related to deviations in gait pattern.   Additional 300' with verbal cueing to address above deficits; verbal cueing for increased heel strike & arm swing. Of note, pt reporting that he feels like his shoes are too big, feels like they are also playing an impact in gait.   SLR 3x10 to promote increased knee extension strength: Verbal cueing for R knee to maintain full extension Pt reporting some discomfort in lower back following exercise  Assessed trunk rotation in seated to determine if decreased  rotation & arm swing is secondary to decreased thoracic mobility or pure guarding: Limited to L initially, improved with continued repetitions.    2x10 rows with red theraband to promote periscapular strengthening for improved posture.  Max verbal cueing for form, tactile cueing for scapular retraction throughout   PATIENT EDUCATION:  Education details: Purpose of PT; Plan of care; Anatomy of lumbar spine; Instruction in HEP Person educated: Patient Education method: Explanation, Demonstration, Tactile cues, Verbal cues, and Handouts Education comprehension: verbalized understanding, returned demonstration, verbal cues required, tactile cues required, and needs further education  HOME EXERCISE PROGRAM: Access Code: [email or web address] URL: [email or web address] Date: 08/26/2023 Prepared by: Reyes London  Exercises - Seated Lumbar Flexion Stretch  - 1 x daily - 3 sets - 30 hold - Seated Hamstring Stretch  - 1 x daily - 3 sets - 30 hold  Access  Code: [email or web address] URL: [email or web address] Date: 08/28/2023 Prepared by: Massie Dollar  Exercises - Supine Lower Trunk Rotation  - 1 x daily - 7 x weekly - 3 sets - 10 reps - Supine Bridge  - 1 x daily - 7 x weekly - 3 sets - 10 reps - Supine Piriformis Stretch with Foot on Ground  - 1 x daily - 7 x weekly - 3 sets - 10 reps - Supine Figure 4 Piriformis Stretch  - 1 x daily - 7 x weekly - 3 sets - 10 reps  ASSESSMENT:  CLINICAL IMPRESSION:   Patient is a 83 y.o. male who was seen today for physical treatment for Spinal stenosis of lumbar region and unsteadiness on feet. Completed gait assessment for additional information related to gait pattern & deficits as related to pertinent surgical history. Also assessed thoracic rotation to determine if mobility deficit or guarding was cause of decreased arm swing & trunk rotation during gait. Based on assessment, appears to be d/t both mobility deficits & guarding. Continue with interventions to address both aspects. Completed therapeutic exercises to focus on knee extension strength & periscapular strength. Pt will benefit from skilled PT services to address deficits and return to pain-free function at home and work.   OBJECTIVE IMPAIRMENTS: Abnormal gait, decreased activity tolerance, decreased balance, decreased coordination, decreased endurance, decreased mobility, difficulty walking, decreased ROM, decreased strength, hypomobility, postural dysfunction, and pain.   ACTIVITY LIMITATIONS: carrying, lifting, bending, sitting, standing, squatting, sleeping, stairs, transfers, and bed mobility  PARTICIPATION LIMITATIONS: meal prep, cleaning, laundry, shopping, community activity, and yard work  PERSONAL FACTORS: Age, Time since onset of injury/illness/exacerbation, and 3+ comorbidities: OA, DDD  Previous surgeries,  are also affecting patient's functional outcome.   REHAB POTENTIAL: Good  CLINICAL DECISION MAKING: Evolving/moderate  complexity  EVALUATION COMPLEXITY: Moderate   GOALS: Goals reviewed with patient? Yes  SHORT TERM GOALS: Target date: 10/07/2023  Pt will be independent with HEP in order to improve strength and decrease back pain in order to improve pain-free function at home. Baseline: EVAL= No formal HEP in place Goal status: INITIAL   LONG TERM GOALS: Target date: 11/18/2023  Pt will decrease mODI score by at least 8 points in order demonstrate clinically significant reduction in back pain/disability. Baseline: EVAL = 24 Goal status: INITIAL  2.  Pt will decrease worst back pain as reported on NPRS by at least 2 points in order to demonstrate clinically significant reduction in back pain.  Baseline: Worst low back pain=6/10 Goal status: INITIAL  3.  Pt will improve BERG by at least 3 points in order to demonstrate clinically significant improvement in  balance.  Baseline: 47/56  Goal status: INITIAL  4.  Pt will improve ABC by at least 13% in order to demonstrate clinically significant improvement in balance confidence Baseline: EVAL= 58.75% Goal status: INITIAL  5.  Pt will decrease 5TSTS by at least 3 seconds in order to demonstrate clinically significant improvement in LE strength. Baseline: 14.07 sec without UE Goal status: INITIAL  6.  Pt will increase by at least 61m (139ft) in order to demonstrate clinically significant improvement in cardiopulmonary endurance and community ambulation  Baseline: 829ft with no AD Goal status: INITIAL  PLAN:  PT FREQUENCY: 1-2x/week  PT DURATION: 12 weeks  PLANNED INTERVENTIONS: [id number]- PT Re-evaluation, 97750- Physical Performance Testing, 97110-Therapeutic exercises, 97530- Therapeutic activity, [id number]- Neuromuscular re-education, 97535- Self Care, [id number]- Manual therapy, [id number]- Gait training, [id number]- Orthotic Initial, [id number]- Orthotic/Prosthetic subsequent, [phone]- Canalith repositioning, [id number]- Electrical stimulation (manual), [phone] (1-2  muscles), [id number] (3+ muscles)- Dry Needling, Patient/Family education, Balance training, Stair training, Taping, Joint mobilization, Joint manipulation, Spinal manipulation, Spinal mobilization, Vestibular training, DME instructions, Cryotherapy, and Moist heat.  PLAN FOR NEXT SESSION:   -Low back, thoracic, LE stretching  - open book stretches  - thoracic extension -Gait: address decreased knee extension, heel strike, guarded position leading to decreased arm swing & trunk rotation  -assess footwear (Add any balance, LE/low back/core strengthening to HEP)   Chiquita Silvan, Student-PT 09/03/2023, 3:22 PM

## 2023-09-05 ENCOUNTER — Inpatient Hospital Stay: Admitting: Oncology

## 2023-09-05 ENCOUNTER — Ambulatory Visit: Admitting: Oncology

## 2023-09-05 ENCOUNTER — Ambulatory Visit

## 2023-09-05 ENCOUNTER — Inpatient Hospital Stay

## 2023-09-05 ENCOUNTER — Encounter: Payer: Self-pay | Admitting: Oncology

## 2023-09-05 VITALS — BP 144/73 | HR 80

## 2023-09-05 VITALS — BP 138/69 | HR 90 | Temp 98.6°F | Resp 19 | Wt 147.0 lb

## 2023-09-05 DIAGNOSIS — D500 Iron deficiency anemia secondary to blood loss (chronic): Secondary | ICD-10-CM

## 2023-09-05 DIAGNOSIS — Q2730 Arteriovenous malformation, site unspecified: Secondary | ICD-10-CM | POA: Diagnosis not present

## 2023-09-05 DIAGNOSIS — D509 Iron deficiency anemia, unspecified: Secondary | ICD-10-CM | POA: Diagnosis not present

## 2023-09-05 DIAGNOSIS — D692 Other nonthrombocytopenic purpura: Secondary | ICD-10-CM | POA: Diagnosis not present

## 2023-09-05 MED ORDER — IRON SUCROSE 20 MG/ML IV SOLN
200.0000 mg | Freq: Once | INTRAVENOUS | Status: AC
  Administered 2023-09-05: 200 mg via INTRAVENOUS

## 2023-09-05 NOTE — Assessment & Plan Note (Signed)
 Reassurance was provided.

## 2023-09-05 NOTE — Progress Notes (Signed)
 Hematology/Oncology Progress note [phone] [phone]      CHIEF COMPLAINTS/REASON FOR VISIT:  Iron  deficiency anemia, hemochromatosis.   ASSESSMENT & PLAN:   IDA (iron  deficiency anemia) Labs reviewed and discussed with patient Lab Results  Component Value Date   HGB 13.8 09/03/2023   TIBC 319 09/03/2023   IRONPCTSAT 15 (L) 09/03/2023   FERRITIN 10 (L) 09/03/2023    Hemoglobin has improved.  Recommend IV venofer  weekly x 2 conitnue iron  contained multivitamin supplementation .  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  Previous GI recommendation was reviewed. Recommend observation.      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 Monitor iron  panel, currently no evidence of iron  over load.    Senile purpura (HCC) Reassurance was provided.    Orders Placed This Encounter  Procedures   Hepatic function panel    Standing Status:   Future    Expected Date:   12/05/2023    Expiration Date:   09/04/2024   CBC (Cancer Center Only)    Standing Status:   Future    Expected Date:   12/05/2023    Expiration Date:   03/04/2024   Iron  and TIBC    Standing Status:   Future    Expected Date:   12/05/2023    Expiration Date:   03/04/2024   Ferritin    Standing Status:   Future    Expected Date:   12/05/2023    Expiration Date:   03/04/2024   Retic Panel    Standing Status:   Future    Expected Date:   12/05/2023    Expiration Date:   03/04/2024   Follow up in 3 months. All questions were answered. The patient knows to call the clinic with any problems, questions or concerns.  Zelphia Cap, MD, PhD [facility] Hematology Oncology 09/05/2023     HISTORY OF PRESENTING ILLNESS:  Carlos Olden. is a  83 y.o.  male with PMH listed below who was referred to me for anemia Reviewed patient's recent labs that was done.  He was found to have abnormal CBC on  01/11/2022 with a hemoglobin of 7. Patient was recently admitted to the hospital due to acute drop of hemoglobin to 5.2, status post multiple units of PRBC transfusion.  01/03/2022, iron  panel is consistent with severe iron  deficiency.  He recently had hip surgery and was on Lovenox  anticoagulation prophylaxis, in combination use of Celebrex .  Patient underwent upper endoscopy, colonoscopy and capsule study.  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  Patient was discharged on 01/06/2022 with a hemoglobin of 7.4. Patient reports feeling tired and fatigued. He had not noticed any recent bleeding such as epistaxis, hematuria or hematochezia.   Patient reports remote history of hereditary hemochromatosis, diagnosed with 30 years ago with a  iron  level of 3000.  Patient underwent phlebotomy frequently at that point. Since he establish care with Dr. Alla 11 years ago, he has not needed phlebotomy. Reviewed his previous blood work in [employer].  Patient has had decreased ferritin level dated back to at least 2015.    INTERVAL HISTORY Carlos Neal. is a 83 y.o. male who has above history reviewed by me today presents for follow up visit for iron  deficiency anemia. He tolerated IV Venofer  treatments.  Denies any melena, hematuria, hematochezia.  Fatigue is chronic, no change.  + easy bruising, especially on his forearms.  +  right shoulder pain.   MEDICAL HISTORY:  Past Medical History:  Diagnosis Date   Anemia    Asthma    as a child   GERD (gastroesophageal reflux disease)    Hypertension    Osteoarthritis    back and knee   Osteopenia    Osteoporosis    Vitamin D  deficiency     SURGICAL HISTORY: Past Surgical History:  Procedure Laterality Date   BROW LIFT Bilateral 09/23/2020   Procedure: BLEPHAROPTOSIS REPAIR; RESECT EX  BROW PTOSIS REPAIR ECTROPION REPAIR, EXTENSIVE BILATERAL;  Surgeon: Ashley Greig HERO, MD;  Location: [facility];  Service: Ophthalmology;   Laterality: Bilateral;  wants to be later in the schedule   COLONOSCOPY  01/12/2013   COLONOSCOPY N/A 01/05/2022   Procedure: COLONOSCOPY;  Surgeon: Toledo, Ladell POUR, MD;  Location: [facility];  Service: Gastroenterology;  Laterality: N/A;   ESOPHAGOGASTRODUODENOSCOPY (EGD) WITH PROPOFOL  N/A 01/05/2022   Procedure: ESOPHAGOGASTRODUODENOSCOPY (EGD) WITH PROPOFOL ;  Surgeon: Toledo, Ladell POUR, MD;  Location: [facility];  Service: Gastroenterology;  Laterality: N/A;   GIVENS CAPSULE STUDY N/A 01/05/2022   Procedure: GIVENS CAPSULE STUDY;  Surgeon: Toledo, Ladell POUR, MD;  Location: [facility];  Service: Gastroenterology;  Laterality: N/A;   HYDROCELE EXCISION     age 54   INGUINAL HERNIA REPAIR Bilateral    as a child   KNEE ARTHROPLASTY Right 01/30/2021   Procedure: COMPUTER ASSISTED TOTAL KNEE ARTHROPLASTY;  Surgeon: Mardee Lynwood SQUIBB, MD;  Location: [facility];  Service: Orthopedics;  Laterality: Right;   LUMBAR LAMINECTOMY/ DECOMPRESSION WITH MET-RX Right 01/15/2020   Procedure: L2-3 DECOMPRESSION, RIGHT L2-3 FAR LATERAL DISCECTOMY;  Surgeon: Clois Fret, MD;  Location: [facility];  Service: Neurosurgery;  Laterality: Right;  2nd case   TOTAL HIP ARTHROPLASTY Left 12/04/2021   Procedure: TOTAL HIP ARTHROPLASTY;  Surgeon: Mardee Lynwood SQUIBB, MD;  Location: [facility];  Service: Orthopedics;  Laterality: Left;    SOCIAL HISTORY: Social History   Socioeconomic History   Marital status: Married    Spouse name: Glenda   Number of children: 1   Years of education: Not on file   Highest education level: Not on file  Occupational History   Not on file  Tobacco Use   Smoking status: Never   Smokeless tobacco: Never  Vaping Use   Vaping status: Never Used  Substance and Sexual Activity   Alcohol  use: Yes    Alcohol /week: 3.0 standard drinks of alcohol     Types: 3 Glasses of wine per week    Comment: occasional   Drug use: Never   Sexual activity: Not on file  Other Topics Concern    Not on file  Social History Narrative   Not on file   Social Drivers of Health   Financial Resource Strain: Low Risk  (11/14/2022)   Received from [facility]   Overall Financial Resource Strain (CARDIA)    Difficulty of Paying Living Expenses: Not hard at all  Food Insecurity: No Food Insecurity (11/14/2022)   Received from [facility]   Hunger Vital Sign    Within the past 12 months, you worried that your food would run out before you got the money to buy more.: Never true    Within the past 12 months, the food you bought just didn't last and you didn't have money to get more.: Never true  Transportation Needs: No Transportation Needs (11/14/2022)   Received from [facility]   [facility] - [facility]  Lack of Transportation (Non-Medical): No    In the past 12 months, has lack of transportation kept you from medical appointments or from getting medications?: No  Physical Activity: Not on file  Stress: Not on file  Social Connections: Not on file  Intimate Partner Violence: Not At Risk (01/04/2022)   Humiliation, Afraid, Rape, and Kick questionnaire    Fear of Current or Ex-Partner: No    Emotionally Abused: No    Physically Abused: No    Sexually Abused: No    FAMILY HISTORY: Family History  Problem Relation Age of Onset   COPD Mother    Blindness Father    Leukemia Maternal Uncle     ALLERGIES:  has no known allergies.  MEDICATIONS:  Current Outpatient Medications  Medication Sig Dispense Refill   acetaminophen  (TYLENOL ) 500 MG tablet Take 1,000 mg by mouth every 6 (six) hours as needed for moderate pain.     brimonidine  (ALPHAGAN  P) 0.1 % SOLN Place 1 drop into both eyes in the morning and at bedtime.     CALCIUM  PO Take 600 mg by mouth daily.     Camphor-Menthol -Methyl Sal (SALONPAS) 3.01-07-08 % PTCH Place 1 patch onto the skin daily as needed (pain).     carboxymethylcellulose (REFRESH PLUS) 0.5 % SOLN Place 1  drop into both eyes daily as needed (dry eyes).     Cholecalciferol  (VITAMIN D3 PO) Take 1 tablet by mouth daily.     diclofenac Sodium (VOLTAREN) 1 % GEL Apply 1 application topically 4 (four) times daily as needed (pain).     finasteride (PROSCAR) 5 MG tablet Take 5 mg by mouth daily.     hydrochlorothiazide  (HYDRODIURIL ) 25 MG tablet Take 25 mg by mouth daily. Takes 1/2 tab am     losartan  (COZAAR ) 25 MG tablet Take 25 mg by mouth at bedtime.     Melatonin 10 MG CAPS Take 10 mg by mouth at bedtime as needed (sleep).     Multiple Vitamins-Minerals (PRESERVISION AREDS 2) CAPS Take 1 capsule by mouth daily.     tamsulosin  (FLOMAX ) 0.4 MG CAPS capsule Take 0.8 mg by mouth.     traZODone  (DESYREL ) 50 MG tablet Take 50 mg by mouth at bedtime as needed for sleep.     vitamin B-12 (CYANOCOBALAMIN ) 100 MCG tablet Take 200 mcg by mouth daily.     Zoledronic Acid (RECLAST IV) Inject into the vein. Once per year     oxyCODONE  (OXY IR/ROXICODONE ) 5 MG immediate release tablet Take 1 tablet (5 mg total) by mouth every 4 (four) hours as needed for severe pain. (Patient not taking: Reported on 09/05/2023) 30 tablet 0   traMADol  (ULTRAM ) 50 MG tablet Take 1 tablet (50 mg total) by mouth every 4 (four) hours as needed for moderate pain. (Patient not taking: Reported on 09/05/2023) 30 tablet 0   No current facility-administered medications for this visit.    Review of Systems  Constitutional:  Positive for fatigue. Negative for appetite change, chills, fever and unexpected weight change.  HENT:   Negative for hearing loss and voice change.   Eyes:  Negative for eye problems and icterus.  Respiratory:  Negative for chest tightness, cough and shortness of breath.   Cardiovascular:  Negative for chest pain and leg swelling.  Gastrointestinal:  Negative for abdominal distention and abdominal pain.  Endocrine: Negative for hot flashes.  Genitourinary:  Negative for difficulty urinating, dysuria and frequency.    Musculoskeletal:  Positive for arthralgias.  Skin:  Negative for itching and rash.  Neurological:  Negative for light-headedness and numbness.  Hematological:  Negative for adenopathy. Bruises/bleeds easily.  Psychiatric/Behavioral:  Negative for confusion.     PHYSICAL EXAMINATION: ECOG PERFORMANCE STATUS: 1 - Symptomatic but completely ambulatory Vitals:   09/05/23 1320  BP: 138/69  Pulse: 90  Resp: 19  Temp: 98.6 F (37 C)  SpO2: 98%   Filed Weights   09/05/23 1320  Weight: 147 lb (66.7 kg)    Physical Exam Constitutional:      General: He is not in acute distress. HENT:     Head: Normocephalic and atraumatic.  Eyes:     General: No scleral icterus. Cardiovascular:     Rate and Rhythm: Normal rate.  Pulmonary:     Effort: Pulmonary effort is normal. No respiratory distress.     Breath sounds: No wheezing.  Abdominal:     General: There is no distension.  Musculoskeletal:        General: Normal range of motion.     Cervical back: Normal range of motion.  Skin:    Findings: No rash.  Neurological:     Mental Status: He is alert and oriented to person, place, and time. Mental status is at baseline.  Psychiatric:        Mood and Affect: Mood normal.      LABORATORY DATA:  I have reviewed the data as listed    Latest Ref Rng & Units 09/03/2023    8:57 AM 06/03/2023    8:55 AM 02/18/2023    8:48 AM  CBC  WBC 4.0 - 10.5 K/uL 6.9  5.4  5.8   Hemoglobin 13.0 - 17.0 g/dL 86.1  89.2  9.0   Hematocrit 39.0 - 52.0 % 40.7  32.9  29.8   Platelets 150 - 400 K/uL 274  308  420       Latest Ref Rng & Units 06/03/2023    8:55 AM 01/11/2022   10:25 AM 01/06/2022    4:45 AM  CMP  Glucose 70 - 99 mg/dL  72  93   BUN 8 - 23 mg/dL  30  13   Creatinine 9.38 - 1.24 mg/dL  9.15  9.19   Sodium 864 - 145 mmol/L  135  139   Potassium 3.5 - 5.1 mmol/L  3.5  3.7   Chloride 98 - 111 mmol/L  103  110   CO2 22 - 32 mmol/L  26  22   Calcium  8.9 - 10.3 mg/dL  8.6  8.5   Total  Protein 6.5 - 8.1 g/dL 6.3  6.0    Total Bilirubin 0.0 - 1.2 mg/dL 0.8  0.5    Alkaline Phos 38 - 126 U/L 46  48    AST 15 - 41 U/L 16  16    ALT 0 - 44 U/L 9  6        Component Value Date/Time   IRON  47 09/03/2023 0857   TIBC 319 09/03/2023 0857   FERRITIN 10 (L) 09/03/2023 0857   FERRITIN 94 04/16/2011 1406   IRONPCTSAT 15 (L) 09/03/2023 0857     RADIOGRAPHIC STUDIES: I have personally reviewed the radiological images as listed and agreed with the findings in the report. No results found.

## 2023-09-05 NOTE — Assessment & Plan Note (Addendum)
 Labs reviewed and discussed with patient Lab Results  Component Value Date   HGB 13.8 09/03/2023   TIBC 319 09/03/2023   IRONPCTSAT 15 (L) 09/03/2023   FERRITIN 10 (L) 09/03/2023    Hemoglobin has improved.  Recommend IV venofer  weekly x 2 conitnue iron  contained multivitamin supplementation .

## 2023-09-05 NOTE — Patient Instructions (Signed)

## 2023-09-05 NOTE — Assessment & Plan Note (Addendum)
 chronic intermittent GI blood loss.  Previous GI recommendation was reviewed. Recommend observation.

## 2023-09-05 NOTE — Assessment & Plan Note (Signed)
 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 Monitor iron panel, currently no evidence of iron over load.

## 2023-09-09 ENCOUNTER — Ambulatory Visit

## 2023-09-09 DIAGNOSIS — M6281 Muscle weakness (generalized): Secondary | ICD-10-CM

## 2023-09-09 DIAGNOSIS — R278 Other lack of coordination: Secondary | ICD-10-CM

## 2023-09-09 DIAGNOSIS — M5459 Other low back pain: Secondary | ICD-10-CM

## 2023-09-09 DIAGNOSIS — R269 Unspecified abnormalities of gait and mobility: Secondary | ICD-10-CM

## 2023-09-09 DIAGNOSIS — R2689 Other abnormalities of gait and mobility: Secondary | ICD-10-CM

## 2023-09-09 DIAGNOSIS — R2681 Unsteadiness on feet: Secondary | ICD-10-CM

## 2023-09-09 DIAGNOSIS — R262 Difficulty in walking, not elsewhere classified: Secondary | ICD-10-CM

## 2023-09-09 NOTE — Therapy (Signed)
 OUTPATIENT PHYSICAL THERAPY THORACOLUMBAR TREATMENT   Patient Name: Mcgregor Tinnon. MRN: [id number] DOB:05-17-1940, 83 y.o., male Today's Date: 09/09/2023  END OF SESSION:    PT End of Session - 09/09/23 0933     Visit Number 4    Number of Visits 24    Date for PT Re-Evaluation 11/18/23    Progress Note Due on Visit 10    PT Start Time 0934    PT Stop Time 1015    PT Time Calculation (min) 41 min    Equipment Utilized During Treatment Gait belt    Activity Tolerance Patient tolerated treatment well    Behavior During Therapy WFL for tasks assessed/performed            Past Medical History:  Diagnosis Date   Anemia    Asthma    as a child   GERD (gastroesophageal reflux disease)    Hypertension    Osteoarthritis    back and knee   Osteopenia    Osteoporosis    Vitamin D  deficiency    Past Surgical History:  Procedure Laterality Date   BROW LIFT Bilateral 09/23/2020   Procedure: BLEPHAROPTOSIS REPAIR; RESECT EX  BROW PTOSIS REPAIR ECTROPION REPAIR, EXTENSIVE BILATERAL;  Surgeon: Ashley Greig HERO, MD;  Location: [facility];  Service: Ophthalmology;  Laterality: Bilateral;  wants to be later in the schedule   COLONOSCOPY  01/12/2013   COLONOSCOPY N/A 01/05/2022   Procedure: COLONOSCOPY;  Surgeon: Toledo, Ladell POUR, MD;  Location: [facility];  Service: Gastroenterology;  Laterality: N/A;   ESOPHAGOGASTRODUODENOSCOPY (EGD) WITH PROPOFOL  N/A 01/05/2022   Procedure: ESOPHAGOGASTRODUODENOSCOPY (EGD) WITH PROPOFOL ;  Surgeon: Toledo, Ladell POUR, MD;  Location: [facility];  Service: Gastroenterology;  Laterality: N/A;   GIVENS CAPSULE STUDY N/A 01/05/2022   Procedure: GIVENS CAPSULE STUDY;  Surgeon: Toledo, Ladell POUR, MD;  Location: [facility];  Service: Gastroenterology;  Laterality: N/A;   HYDROCELE EXCISION     age 50   INGUINAL HERNIA REPAIR Bilateral    as a child   KNEE ARTHROPLASTY Right 01/30/2021   Procedure: COMPUTER ASSISTED TOTAL KNEE ARTHROPLASTY;   Surgeon: Mardee Lynwood SQUIBB, MD;  Location: [facility];  Service: Orthopedics;  Laterality: Right;   LUMBAR LAMINECTOMY/ DECOMPRESSION WITH MET-RX Right 01/15/2020   Procedure: L2-3 DECOMPRESSION, RIGHT L2-3 FAR LATERAL DISCECTOMY;  Surgeon: Clois Fret, MD;  Location: [facility];  Service: Neurosurgery;  Laterality: Right;  2nd case   TOTAL HIP ARTHROPLASTY Left 12/04/2021   Procedure: TOTAL HIP ARTHROPLASTY;  Surgeon: Mardee Lynwood SQUIBB, MD;  Location: [facility];  Service: Orthopedics;  Laterality: Left;   Patient Active Problem List   Diagnosis Date Noted   Senile purpura (HCC) 09/05/2023   IDA (iron  deficiency anemia) 01/11/2022   AVM (arteriovenous malformation) 01/11/2022   Symptomatic anemia 01/03/2022   Positive D dimer 01/03/2022   H/O total hip arthroplasty, left 12/04/21 12/04/2021   Chronic left shoulder pain 11/09/2021   Primary osteoarthritis of left hip 08/22/2021   Status post total right knee replacement 01/30/2021   B12 deficiency 03/16/2020   BPH associated with nocturia 03/16/2020   Chronic pain syndrome 12/23/2019   Pharmacologic therapy 12/23/2019   Disorder of skeletal system 12/23/2019   Problems influencing health status 12/23/2019   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HCC) 12/23/2019   Chronic lower extremity pain (1ry area of Pain) (Right) 12/23/2019   Chronic low back pain (2ry area of Pain) (Bilateral) (R>L) w/ sciatica (Right) 12/23/2019   Abnormal MRI, lumbar spine (12/04/2019) 12/23/2019  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) 12/23/2019   Retrolisthesis (T12-L1, L1-2) 12/23/2019   Lumbosacral foraminal stenosis 12/23/2019   Lumbar facet arthropathy 12/23/2019   Osteoarthritis of facet joint of lumbar spine 12/23/2019   Lumbar facet syndrome (Bilateral) 12/23/2019   Wedge compression fracture of T12 vertebra, sequela 12/23/2019   DDD (degenerative disc disease), lumbosacral 12/23/2019   DDD (degenerative disc disease), cervical 12/23/2019   Cervical facet  syndrome (Right) 12/23/2019   Cervicalgia 12/23/2019   Chronic neck pain (Right) 12/23/2019   Essential hypertension 12/20/2019   GERD without esophagitis 12/20/2019   Hereditary hemochromatosis (HCC) 12/20/2019   Impotence 12/20/2019   Insomnia 12/20/2019   Osteopenia 12/20/2019   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 Alda Carpen  REFERRING PROVIDER: Dr. Alda Carpen  REFERRING DIAG:  [phone] (ICD-10-CM) - Spinal stenosis, lumbar region, without neurogenic claudication  R26.81 (ICD-10-CM) - Unsteadiness on feet    Rationale for Evaluation and Treatment: Rehabilitation  THERAPY DIAG:   Abnormality of gait and mobility  Muscle weakness (generalized)  Unsteadiness on feet  Difficulty in walking, not elsewhere classified  Other abnormalities of gait and mobility  Other low back pain  Other lack of coordination  ONSET DATE: 2022  SUBJECTIVE:                                                                                                                                                                                           SUBJECTIVE STATEMENT:    Pt reports the injections in the shoulder helped a little bit, but it's still bothering him when making certain movements.   Pt reports he has been working heavy on his gait.     From EVALUATION:  I just can't get my walking like I want to. I'm unsteady and have had recent hip, knee and back surgeries back in 2022 and 2023. My main concern is to get my walking better.   PERTINENT HISTORY:  Per most recent PCP Visit note on 08/20/2023: Spinal stenosis of lumbar region without neurogenic claudication Slightly improved but still  symptomatic. Patient with unstable gait and lack of strength. Plan to refer to physical therapy for evaluation   PAIN:  Are you having pain? Yes: NPRS scale: current 0/10; worst=6/10 Pain location: Right lower back Pain description: ache and sharp with walking Aggravating factors: getting up in morning, walking in morning-does improve throughout the day Relieving  factors: Tylenol , heat  PRECAUTIONS: Fall  RED FLAGS: Bowel or bladder incontinence: Yes:     WEIGHT BEARING RESTRICTIONS: No  FALLS:  Has patient fallen in last 6 months? No  LIVING ENVIRONMENT: Lives with: lives with their spouse Lives in: House/apartment Stairs: Yes: External: 3 steps; can reach both Has following equipment at home: Single point cane, Walker - 2 wheeled, and Grab bars  OCCUPATION: Retired - 2019.  [employer] and worked at [employer] prior to that.   PLOF: Independent  PATIENT GOALS: Improve my walking  NEXT MD VISIT: Dr. Babara (hematologist)   OBJECTIVE:  Note: Objective measures were completed at Evaluation unless otherwise noted.  DIAGNOSTIC FINDINGS:  CLINICAL DATA:  Lumbar surgery L2-3 on 01/15/2020. Continue low back pain with right upper leg pain.   EXAM: MRI LUMBAR SPINE WITHOUT AND WITH CONTRAST   TECHNIQUE: Multiplanar and multiecho pulse sequences of the lumbar spine were obtained without and with intravenous contrast.   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   2 mm retrolisthesis L1-2. 4 mm retrolisthesis T12-L1. 4 mm retrolisthesis L1-2. Mild anterolisthesis L4-5.   Vertebrae: Negative for fracture or mass. Discogenic edema L1-2 and L2-3 similar to the prior study.   Conus medullaris and cauda equina: Conus extends to the T12-L1 level. Conus and cauda equina appear normal.   Paraspinal and other soft  tissues: Negative for paraspinous mass. Interval resolution of left paraspinous cyst at the mid L3 level lateral to the foramen.   Disc levels:   T10-11: Probable left foraminal encroachment due to spurring. No axial images through this level   T11-12: Left paracentral disc protrusion, with progression. Bilateral facet degeneration. Negative for stenosis   T12-L1: Disc degeneration with disc bulging and spurring. No significant stenosis   L1-2: Asymmetric disc degeneration on the right with disc space narrowing and spurring. Bilateral facet hypertrophy. Spinal canal adequate in size. Moderate to severe right subarticular stenosis. Mild left subarticular stenosis. No interval change.   L2-3: Right laminectomy. 12 mm multiloculated fluid collection posterior to the lamina related to recent surgery. Severe facet degeneration. Advanced disc degeneration with disc space narrowing and endplate spurring. Spinal canal now adequate in size with mild narrowing. Moderate to severe subarticular and foraminal stenosis bilaterally right greater than left due to spurring is similar to the prior study.   L3-4: Diffuse disc bulging and bilateral facet degeneration. Mild subarticular stenosis bilaterally.   L4-5: Mild anterolisthesis. Asymmetric disc degeneration on the left with disc space narrowing and spurring. Advanced facet degeneration bilaterally. Moderate subarticular and foraminal stenosis on the left. Mild to moderate right subarticular stenosis   L5-S1: Bilateral facet degeneration. Mild right subarticular stenosis due to spurring.   IMPRESSION: Multilevel degenerative change throughout the lumbar spine most severe at L1-2 and L2-3.   Interval right laminectomy at L2-3 with improvement in canal stenosis. There remains moderate to severe subarticular stenosis bilaterally right greater than left. Interval resolution of cyst lateral to the L3 vertebral body.     Electronically  Signed   By: Carlin Gaskins M.D.   On: 04/04/2020 11:52    PATIENT SURVEYS:  ABC scale: The Activities-Specific Balance Confidence (ABC) Scale 0% 10 20 30  40 50 60 70 80 90 100% No confidence<->completely confident  "How confident are you that you will not lose your balance or become  unsteady when you . . .   Date tested 08/26/2023  Walk around the house 90%  2. Walk up or down stairs 50%  3. Bend over and pick up a slipper from in front of a closet floor 50%  4. Reach for a small can off a shelf at eye level 70%  5. Stand on tip toes and reach for something above your head 40%  6. Stand on a chair and reach for something 10%  7. Sweep the floor 50%  8. Walk outside the house to a car parked in the driveway 90%  9. Get into or out of a car 80%  10. Walk across a parking lot to the mall 70%  11. Walk up or down a ramp 60%  12. Walk in a crowded mall where people rapidly walk past you 70%  13. Are bumped into by people as you walk through the mall 70%  14. Step onto or off of an escalator while you are holding onto the railing 80%  15. Step onto or off an escalator while holding onto parcels such that you cannot hold onto the railing 50%  16. Walk outside on icy sidewalks 10%  Total: #/16 58.75%    Modified Oswestry:  MODIFIED OSWESTRY DISABILITY SCALE  Date: 08/26/2023 Score  Pain intensity 3 =  Pain medication provides me with moderate relief from pain.  2. Personal care (washing, dressing, etc.) 3 =  I need help, but I am able to manage most of my personal care.  3. Lifting 3 = Pain prevents me from lifting heavy weights, but I can manage (5) I have hardly any social life because of my pain. light to medium weights if they are conveniently positioned  4. Walking 2 =  Pain prevents me from walking more than  mile.  5. Sitting 2 =  Pain prevents me from sitting more than 1 hour.  6. Standing 1 =  I can stand as long as I want but, it increases my pain.  7. Sleeping 0 = Pain does  not prevent me from sleeping well.  8. Social Life 0 = My social life is normal and does not increase my pain.  9. Traveling 2 =  My pain restricts my travel over 2 hours.  10. Employment/ Homemaking 1 = My normal homemaking/job activities increase my pain, but I can still perform all that is required of me  Total 17/50=24%   Interpretation of scores: Score Category Description  0-20% Minimal Disability The patient can cope with most living activities. Usually no treatment is indicated apart from advice on lifting, sitting and exercise  21-40% Moderate Disability The patient experiences more pain and difficulty with sitting, lifting and standing. Travel and social life are more difficult and they may be disabled from work. Personal care, sexual activity and sleeping are not grossly affected, and the patient can usually be managed by conservative means  41-60% Severe Disability Pain remains the main problem in this group, but activities of daily living are affected. These patients require a detailed investigation  61-80% Crippled Back pain impinges on all aspects of the patient's life. Positive intervention is required  81-100% Bed-bound  These patients are either bed-bound or exaggerating their symptoms  Bluford FORBES Zoe DELENA Karon DELENA, et al. Surgery versus conservative management of stable thoracolumbar fracture: the PRESTO feasibility RCT. [place] ([place]): [employer]; 2021 Nov. [facility] Technology Assessment, No. 25.62.) Appendix 3, Oswestry Disability Index category descriptors. Available from: FindJewelers.cz  Minimally Clinically Important Difference (MCID) = 12.8%  COGNITION: Overall cognitive status: Within functional limits for tasks assessed     SENSATION: WFL   POSTURE: rounded shoulders and forward head  PALPATION: (+) pain in right lumbar paraspinals  LUMBAR ROM:   AROM eval  Flexion Finger tip to toes with knees straight   Extension WNL  Right lateral flexion Finger tips to mid fibula   Left lateral flexion Finger tips to mid   Right rotation   Left rotation    (Blank rows = not tested)  LOWER EXTREMITY ROM:     Active  Right eval Left eval  Hip flexion    Hip extension    Hip abduction    Hip adduction    Hip internal rotation    Hip external rotation    Knee flexion    Knee extension    Ankle dorsiflexion    Ankle plantarflexion    Ankle inversion    Ankle eversion     (Blank rows = not tested)  LOWER EXTREMITY MMT:    MMT Right eval Left eval  Hip flexion 4 4  Hip extension 4 4  Hip abduction 4 4  Hip adduction    Hip internal rotation 4 4  Hip external rotation 4 4  Knee flexion 4+ 4+  Knee extension 4+ 4+  Ankle dorsiflexion 5 5  Ankle plantarflexion    Ankle inversion    Ankle eversion     (Blank rows = not tested)  LUMBAR SPECIAL TESTS:  Slump test: Negative and SI Compression/distraction test: Negative  FUNCTIONAL TESTS:  5 times sit to stand: 14.07 sec without UE Timed up and go (TUG): 14.34 sec Avg 6 minute walk test: 861ft  10 meter walk test: 0.74 m/s avg Berg Balance Scale: 47/56  GAIT: Distance walked: 80 feet Assistive device utilized: None Level of assistance: SBA Comments: Decreased step length, narrowed BOS, forward flexed posture.   TREATMENT DATE: 09/09/2023   TherEx: To improve strength, endurance, mobility, and function of specific targeted muscle groups or improve joint range of motion or improve muscle flexibility  Supine bridges with glute squeeze and segmental lowering of the spine, 2x10  Supine LTR, 2x10 each side  Supine SAQ with red bolster under knee, 2x10 each LE  Prone hip extensions with bent knee, 2x10  Standing terminal knee extension into physioball, 3 sec holds, 2x10 each LE  Hip thrusts at wall supported on forearm for lumbar extensions, 2x10    Gait:  Round 1: Ambulation around the gym x2 total laps, ~300' with  instruction on heel-to-toe gait pattern and increased arm swing.  Pt then encouraged to take larger strides, however lack ability due to the reduced hip extension  Round 2: Ambulation around the gym x2 total laps, ~300' with instruction on heel-to-toe gait pattern and increased arm swing.  Pt with improved, however still lacking adequate carryover for improved extension of the hips bilaterally.  PATIENT EDUCATION:  Education details: Purpose of PT; Plan of care; Anatomy of lumbar spine; Instruction in HEP Person educated: Patient Education method: Explanation, Demonstration, Tactile cues, Verbal cues, and Handouts Education comprehension: verbalized understanding, returned demonstration, verbal cues required, tactile cues required, and needs further education  HOME EXERCISE PROGRAM: Access Code: [email or web address] URL: [email or web address] Date: 08/26/2023 Prepared by: Reyes London  Exercises - Seated Lumbar Flexion Stretch  - 1 x daily - 3 sets - 30 hold - Seated Hamstring Stretch  - 1 x daily - 3 sets - 30  hold  Access Code: [email or web address] URL: [email or web address] Date: 08/28/2023 Prepared by: Massie Dollar  Exercises - Supine Lower Trunk Rotation  - 1 x daily - 7 x weekly - 3 sets - 10 reps - Supine Bridge  - 1 x daily - 7 x weekly - 3 sets - 10 reps - Supine Piriformis Stretch with Foot on Ground  - 1 x daily - 7 x weekly - 3 sets - 10 reps - Supine Figure 4 Piriformis Stretch  - 1 x daily - 7 x weekly - 3 sets - 10 reps  ASSESSMENT:  CLINICAL IMPRESSION:    Pt responded well to the exercises given and was able to demonstrate improved gait mechanics following treatment as well.  Pt still lacking adequate stride lengths at this time, which is most likely limited by his lack of hip extension.  Pt given exercises to improve upon this and will continue to perform as part of HEP.   Pt will continue to benefit from skilled therapy to address remaining deficits  in order to improve overall QoL and return to PLOF.      OBJECTIVE IMPAIRMENTS: Abnormal gait, decreased activity tolerance, decreased balance, decreased coordination, decreased endurance, decreased mobility, difficulty walking, decreased ROM, decreased strength, hypomobility, postural dysfunction, and pain.   ACTIVITY LIMITATIONS: carrying, lifting, bending, sitting, standing, squatting, sleeping, stairs, transfers, and bed mobility  PARTICIPATION LIMITATIONS: meal prep, cleaning, laundry, shopping, community activity, and yard work  PERSONAL FACTORS: Age, Time since onset of injury/illness/exacerbation, and 3+ comorbidities: OA, DDD  Previous surgeries,  are also affecting patient's functional outcome.   REHAB POTENTIAL: Good  CLINICAL DECISION MAKING: Evolving/moderate complexity  EVALUATION COMPLEXITY: Moderate   GOALS: Goals reviewed with patient? Yes  SHORT TERM GOALS: Target date: 10/07/2023  Pt will be independent with HEP in order to improve strength and decrease back pain in order to improve pain-free function at home. Baseline: EVAL= No formal HEP in place Goal status: INITIAL   LONG TERM GOALS: Target date: 11/18/2023  Pt will decrease mODI score by at least 8 points in order demonstrate clinically significant reduction in back pain/disability. Baseline: EVAL = 24 Goal status: INITIAL  2.  Pt will decrease worst back pain as reported on NPRS by at least 2 points in order to demonstrate clinically significant reduction in back pain.  Baseline: Worst low back pain=6/10 Goal status: INITIAL  3.  Pt will improve BERG by at least 3 points in order to demonstrate clinically significant improvement in balance.  Baseline: 47/56  Goal status: INITIAL  4.  Pt will improve ABC by at least 13% in order to demonstrate clinically significant improvement in balance confidence Baseline: EVAL= 58.75% Goal status: INITIAL  5.  Pt will decrease 5TSTS by at least 3 seconds in  order to demonstrate clinically significant improvement in LE strength. Baseline: 14.07 sec without UE Goal status: INITIAL  6.  Pt will increase by at least 20m (180ft) in order to demonstrate clinically significant improvement in cardiopulmonary endurance and community ambulation  Baseline: 862ft with no AD Goal status: INITIAL  PLAN:  PT FREQUENCY: 1-2x/week  PT DURATION: 12 weeks  PLANNED INTERVENTIONS: [id number]- PT Re-evaluation, 97750- Physical Performance Testing, 97110-Therapeutic exercises, 97530- Therapeutic activity, [id number]- Neuromuscular re-education, 97535- Self Care, [id number]- Manual therapy, [id number]- Gait training, [id number]- Orthotic Initial, [id number]- Orthotic/Prosthetic subsequent, [id number]- Canalith repositioning, [id number]- Electrical stimulation (manual), [phone] (1-2 muscles), 20561 (3+ muscles)- Dry Needling, Patient/Family education, Balance training, Stair training, Taping, Joint mobilization, Joint manipulation, Spinal manipulation, Spinal  mobilization, Vestibular training, DME instructions, Cryotherapy, and Moist heat.  PLAN FOR NEXT SESSION:   -Low back, thoracic, LE stretching  - open book stretches  - thoracic extension -Gait: address decreased knee extension, heel strike, guarded position leading to decreased arm swing & trunk rotation  -assess footwear (Add any balance, LE/low back/core strengthening to HEP)   Fonda Simpers, PT, DPT Physical Therapist - [facility]  09/09/23, 12:00 PM

## 2023-09-11 ENCOUNTER — Ambulatory Visit: Admitting: Physical Therapy

## 2023-09-11 DIAGNOSIS — R269 Unspecified abnormalities of gait and mobility: Secondary | ICD-10-CM

## 2023-09-11 DIAGNOSIS — M5459 Other low back pain: Secondary | ICD-10-CM

## 2023-09-11 DIAGNOSIS — R2681 Unsteadiness on feet: Secondary | ICD-10-CM

## 2023-09-11 DIAGNOSIS — M6281 Muscle weakness (generalized): Secondary | ICD-10-CM

## 2023-09-11 DIAGNOSIS — R262 Difficulty in walking, not elsewhere classified: Secondary | ICD-10-CM

## 2023-09-11 NOTE — Therapy (Signed)
 OUTPATIENT PHYSICAL THERAPY THORACOLUMBAR TREATMENT   Patient Name: Carlos Neal. MRN: [id number] DOB:05-28-1940, 83 y.o., male Today's Date: 09/11/2023  END OF SESSION:    PT End of Session - 09/11/23 0917     Visit Number 5    Number of Visits 24    Date for PT Re-Evaluation 11/18/23    Progress Note Due on Visit 10    PT Start Time 0922    PT Stop Time 1002    PT Time Calculation (min) 40 min    Equipment Utilized During Treatment Gait belt    Activity Tolerance Patient tolerated treatment well    Behavior During Therapy WFL for tasks assessed/performed             Past Medical History:  Diagnosis Date   Anemia    Asthma    as a child   GERD (gastroesophageal reflux disease)    Hypertension    Osteoarthritis    back and knee   Osteopenia    Osteoporosis    Vitamin D  deficiency    Past Surgical History:  Procedure Laterality Date   BROW LIFT Bilateral 09/23/2020   Procedure: BLEPHAROPTOSIS REPAIR; RESECT EX  BROW PTOSIS REPAIR ECTROPION REPAIR, EXTENSIVE BILATERAL;  Surgeon: Ashley Greig HERO, MD;  Location: [facility];  Service: Ophthalmology;  Laterality: Bilateral;  wants to be later in the schedule   COLONOSCOPY  01/12/2013   COLONOSCOPY N/A 01/05/2022   Procedure: COLONOSCOPY;  Surgeon: Toledo, Ladell POUR, MD;  Location: [facility];  Service: Gastroenterology;  Laterality: N/A;   ESOPHAGOGASTRODUODENOSCOPY (EGD) WITH PROPOFOL  N/A 01/05/2022   Procedure: ESOPHAGOGASTRODUODENOSCOPY (EGD) WITH PROPOFOL ;  Surgeon: Toledo, Ladell POUR, MD;  Location: [facility];  Service: Gastroenterology;  Laterality: N/A;   GIVENS CAPSULE STUDY N/A 01/05/2022   Procedure: GIVENS CAPSULE STUDY;  Surgeon: Toledo, Ladell POUR, MD;  Location: [facility];  Service: Gastroenterology;  Laterality: N/A;   HYDROCELE EXCISION     age 82   INGUINAL HERNIA REPAIR Bilateral    as a child   KNEE ARTHROPLASTY Right 01/30/2021   Procedure: COMPUTER ASSISTED TOTAL KNEE  ARTHROPLASTY;  Surgeon: Mardee Lynwood SQUIBB, MD;  Location: [facility];  Service: Orthopedics;  Laterality: Right;   LUMBAR LAMINECTOMY/ DECOMPRESSION WITH MET-RX Right 01/15/2020   Procedure: L2-3 DECOMPRESSION, RIGHT L2-3 FAR LATERAL DISCECTOMY;  Surgeon: Clois Fret, MD;  Location: [facility];  Service: Neurosurgery;  Laterality: Right;  2nd case   TOTAL HIP ARTHROPLASTY Left 12/04/2021   Procedure: TOTAL HIP ARTHROPLASTY;  Surgeon: Mardee Lynwood SQUIBB, MD;  Location: [facility];  Service: Orthopedics;  Laterality: Left;   Patient Active Problem List   Diagnosis Date Noted   Senile purpura (HCC) 09/05/2023   IDA (iron  deficiency anemia) 01/11/2022   AVM (arteriovenous malformation) 01/11/2022   Symptomatic anemia 01/03/2022   Positive D dimer 01/03/2022   H/O total hip arthroplasty, left 12/04/21 12/04/2021   Chronic left shoulder pain 11/09/2021   Primary osteoarthritis of left hip 08/22/2021   Status post total right knee replacement 01/30/2021   B12 deficiency 03/16/2020   BPH associated with nocturia 03/16/2020   Chronic pain syndrome 12/23/2019   Pharmacologic therapy 12/23/2019   Disorder of skeletal system 12/23/2019   Problems influencing health status 12/23/2019   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HCC) 12/23/2019   Chronic lower extremity pain (1ry area of Pain) (Right) 12/23/2019   Chronic low back pain (2ry area of Pain) (Bilateral) (R>L) w/ sciatica (Right) 12/23/2019   Abnormal MRI, lumbar spine (12/04/2019)  12/23/2019   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) 12/23/2019   Retrolisthesis (T12-L1, L1-2) 12/23/2019   Lumbosacral foraminal stenosis 12/23/2019   Lumbar facet arthropathy 12/23/2019   Osteoarthritis of facet joint of lumbar spine 12/23/2019   Lumbar facet syndrome (Bilateral) 12/23/2019   Wedge compression fracture of T12 vertebra, sequela 12/23/2019   DDD (degenerative disc disease), lumbosacral 12/23/2019   DDD (degenerative disc disease), cervical 12/23/2019    Cervical facet syndrome (Right) 12/23/2019   Cervicalgia 12/23/2019   Chronic neck pain (Right) 12/23/2019   Essential hypertension 12/20/2019   GERD without esophagitis 12/20/2019   Hereditary hemochromatosis (HCC) 12/20/2019   Impotence 12/20/2019   Insomnia 12/20/2019   Osteopenia 12/20/2019   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 Alda Carpen  REFERRING PROVIDER: Dr. Alda Carpen  REFERRING DIAG:  [phone] (ICD-10-CM) - Spinal stenosis, lumbar region, without neurogenic claudication  R26.81 (ICD-10-CM) - Unsteadiness on feet    Rationale for Evaluation and Treatment: Rehabilitation  THERAPY DIAG:   Abnormality of gait and mobility  Muscle weakness (generalized)  Unsteadiness on feet  Other low back pain  Difficulty in walking, not elsewhere classified  ONSET DATE: 2022  SUBJECTIVE:                                                                                                                                                                                           SUBJECTIVE STATEMENT:      Pt reports he has been working on his gait.     From EVALUATION:  I just can't get my walking like I want to. I'm unsteady and have had recent hip, knee and back surgeries back in 2022 and 2023. My main concern is to get my walking better.   PERTINENT HISTORY:  Per most recent PCP Visit note on 08/20/2023: Spinal stenosis of lumbar region without neurogenic claudication Slightly improved but still symptomatic. Patient with unstable gait and lack of strength. Plan to refer to physical therapy for evaluation   PAIN:  Are you having pain? Yes: NPRS scale: current 0/10;  worst=6/10 Pain location: Right lower back Pain description: ache and sharp with walking Aggravating factors: getting up in morning, walking in morning-does improve throughout the day Relieving factors: Tylenol , heat  PRECAUTIONS: Fall  RED FLAGS: Bowel or bladder incontinence: Yes:     WEIGHT BEARING RESTRICTIONS: No  FALLS:  Has patient fallen in last  6 months? No  LIVING ENVIRONMENT: Lives with: lives with their spouse Lives in: House/apartment Stairs: Yes: External: 3 steps; can reach both Has following equipment at home: Single point cane, Walker - 2 wheeled, and Grab bars  OCCUPATION: Retired - 2019.  [employer] and worked at [employer] prior to that.   PLOF: Independent  PATIENT GOALS: Improve my walking  NEXT MD VISIT: Dr. Babara (hematologist)   OBJECTIVE:  Note: Objective measures were completed at Evaluation unless otherwise noted.  DIAGNOSTIC FINDINGS:  CLINICAL DATA:  Lumbar surgery L2-3 on 01/15/2020. Continue low back pain with right upper leg pain.   EXAM: MRI LUMBAR SPINE WITHOUT AND WITH CONTRAST   TECHNIQUE: Multiplanar and multiecho pulse sequences of the lumbar spine were obtained without and with intravenous contrast.   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   2 mm retrolisthesis L1-2. 4 mm retrolisthesis T12-L1. 4 mm retrolisthesis L1-2. Mild anterolisthesis L4-5.   Vertebrae: Negative for fracture or mass. Discogenic edema L1-2 and L2-3 similar to the prior study.   Conus medullaris and cauda equina: Conus extends to the T12-L1 level. Conus and cauda equina appear normal.   Paraspinal and other soft tissues: Negative for paraspinous mass. Interval resolution of left paraspinous cyst at the mid L3 level lateral to the foramen.   Disc levels:   T10-11: Probable left  foraminal encroachment due to spurring. No axial images through this level   T11-12: Left paracentral disc protrusion, with progression. Bilateral facet degeneration. Negative for stenosis   T12-L1: Disc degeneration with disc bulging and spurring. No significant stenosis   L1-2: Asymmetric disc degeneration on the right with disc space narrowing and spurring. Bilateral facet hypertrophy. Spinal canal adequate in size. Moderate to severe right subarticular stenosis. Mild left subarticular stenosis. No interval change.   L2-3: Right laminectomy. 12 mm multiloculated fluid collection posterior to the lamina related to recent surgery. Severe facet degeneration. Advanced disc degeneration with disc space narrowing and endplate spurring. Spinal canal now adequate in size with mild narrowing. Moderate to severe subarticular and foraminal stenosis bilaterally right greater than left due to spurring is similar to the prior study.   L3-4: Diffuse disc bulging and bilateral facet degeneration. Mild subarticular stenosis bilaterally.   L4-5: Mild anterolisthesis. Asymmetric disc degeneration on the left with disc space narrowing and spurring. Advanced facet degeneration bilaterally. Moderate subarticular and foraminal stenosis on the left. Mild to moderate right subarticular stenosis   L5-S1: Bilateral facet degeneration. Mild right subarticular stenosis due to spurring.   IMPRESSION: Multilevel degenerative change throughout the lumbar spine most severe at L1-2 and L2-3.   Interval right laminectomy at L2-3 with improvement in canal stenosis. There remains moderate to severe subarticular stenosis bilaterally right greater than left. Interval resolution of cyst lateral to the L3 vertebral body.     Electronically Signed   By: Carlin Gaskins M.D.   On: 04/04/2020 11:52    PATIENT SURVEYS:  ABC scale: The Activities-Specific Balance Confidence (ABC) Scale 0% 10 20 30  40 50 60 70  80 90 100% No confidence<->completely confident  "How confident are you that you will not lose your balance or become unsteady when you . . .   Date tested 08/26/2023  Walk around the house 90%  2. Walk up or down stairs 50%  3. Bend over and  pick up a slipper from in front of a closet floor 50%  4. Reach for a small can off a shelf at eye level 70%  5. Stand on tip toes and reach for something above your head 40%  6. Stand on a chair and reach for something 10%  7. Sweep the floor 50%  8. Walk outside the house to a car parked in the driveway 90%  9. Get into or out of a car 80%  10. Walk across a parking lot to the mall 70%  11. Walk up or down a ramp 60%  12. Walk in a crowded mall where people rapidly walk past you 70%  13. Are bumped into by people as you walk through the mall 70%  14. Step onto or off of an escalator while you are holding onto the railing 80%  15. Step onto or off an escalator while holding onto parcels such that you cannot hold onto the railing 50%  16. Walk outside on icy sidewalks 10%  Total: #/16 58.75%    Modified Oswestry:  MODIFIED OSWESTRY DISABILITY SCALE  Date: 08/26/2023 Score  Pain intensity 3 =  Pain medication provides me with moderate relief from pain.  2. Personal care (washing, dressing, etc.) 3 =  I need help, but I am able to manage most of my personal care.  3. Lifting 3 = Pain prevents me from lifting heavy weights, but I can manage (5) I have hardly any social life because of my pain. light to medium weights if they are conveniently positioned  4. Walking 2 =  Pain prevents me from walking more than  mile.  5. Sitting 2 =  Pain prevents me from sitting more than 1 hour.  6. Standing 1 =  I can stand as long as I want but, it increases my pain.  7. Sleeping 0 = Pain does not prevent me from sleeping well.  8. Social Life 0 = My social life is normal and does not increase my pain.  9. Traveling 2 =  My pain restricts my travel over 2 hours.   10. Employment/ Homemaking 1 = My normal homemaking/job activities increase my pain, but I can still perform all that is required of me  Total 17/50=24%   Interpretation of scores: Score Category Description  0-20% Minimal Disability The patient can cope with most living activities. Usually no treatment is indicated apart from advice on lifting, sitting and exercise  21-40% Moderate Disability The patient experiences more pain and difficulty with sitting, lifting and standing. Travel and social life are more difficult and they may be disabled from work. Personal care, sexual activity and sleeping are not grossly affected, and the patient can usually be managed by conservative means  41-60% Severe Disability Pain remains the main problem in this group, but activities of daily living are affected. These patients require a detailed investigation  61-80% Crippled Back pain impinges on all aspects of the patient's life. Positive intervention is required  81-100% Bed-bound  These patients are either bed-bound or exaggerating their symptoms  Bluford FORBES Zoe DELENA Karon DELENA, et al. Surgery versus conservative management of stable thoracolumbar fracture: the PRESTO feasibility RCT. [place] ([place]): [employer]; 2021 Nov. [facility] Technology Assessment, No. 25.62.) Appendix 3, Oswestry Disability Index category descriptors. Available from: FindJewelers.cz  Minimally Clinically Important Difference (MCID) = 12.8%  COGNITION: Overall cognitive status: Within functional limits for tasks assessed     SENSATION: WFL   POSTURE: rounded shoulders  and forward head  PALPATION: (+) pain in right lumbar paraspinals  LUMBAR ROM:   AROM eval  Flexion Finger tip to toes with knees straight  Extension WNL  Right lateral flexion Finger tips to mid fibula   Left lateral flexion Finger tips to mid   Right rotation   Left rotation    (Blank rows = not tested)  LOWER  EXTREMITY ROM:     Active  Right eval Left eval  Hip flexion    Hip extension    Hip abduction    Hip adduction    Hip internal rotation    Hip external rotation    Knee flexion    Knee extension    Ankle dorsiflexion    Ankle plantarflexion    Ankle inversion    Ankle eversion     (Blank rows = not tested)  LOWER EXTREMITY MMT:    MMT Right eval Left eval  Hip flexion 4 4  Hip extension 4 4  Hip abduction 4 4  Hip adduction    Hip internal rotation 4 4  Hip external rotation 4 4  Knee flexion 4+ 4+  Knee extension 4+ 4+  Ankle dorsiflexion 5 5  Ankle plantarflexion    Ankle inversion    Ankle eversion     (Blank rows = not tested)  LUMBAR SPECIAL TESTS:  Slump test: Negative and SI Compression/distraction test: Negative  FUNCTIONAL TESTS:  5 times sit to stand: 14.07 sec without UE Timed up and go (TUG): 14.34 sec Avg 6 minute walk test: 863ft  10 meter walk test: 0.74 m/s avg Berg Balance Scale: 47/56  GAIT: Distance walked: 80 feet Assistive device utilized: None Level of assistance: SBA Comments: Decreased step length, narrowed BOS, forward flexed posture.   TREATMENT DATE: 09/11/2023   TherEx: To improve strength, endurance, mobility, and function of specific targeted muscle groups or improve joint range of motion or improve muscle flexibility  Seated HS stretch 2 x 45 sec ea   Standing hip flexor stretch 2 x 45 sec ea   TA- To improve functional movements patterns for everyday tasks   Step to 1/2 foam roller for practice of heel strike 3 x 10 repetitions on each lower extremity  Step over hurdle, then onto step trainer then off step trainer and over another hurdle in step over step gait pattern   Step up with step over step pattern and upper extremity assist 2 x 10 reps each lower extremity  Gait:  Ambulation around the gym x2 total laps, ~300' with instruction on heel-to-toe gait pattern and longer strides  Following this completed 2  rounds of retrogait followed by forward gait approximately 30 feet of each Seated rest and repeated another round of the above.  After retrogait patient forward gait and armswing did show some improvement in quality.  PATIENT EDUCATION:  Education details: Purpose of PT; Plan of care; Anatomy of lumbar spine; Instruction in HEP Person educated: Patient Education method: Explanation, Demonstration, Tactile cues, Verbal cues, and Handouts Education comprehension: verbalized understanding, returned demonstration, verbal cues required, tactile cues required, and needs further education  HOME EXERCISE PROGRAM: Access Code: [email or web address] URL: [email or web address] Date: 08/26/2023 Prepared by: Reyes London  Exercises - Seated Lumbar Flexion Stretch  - 1 x daily - 3 sets - 30 hold - Seated Hamstring Stretch  - 1 x daily - 3 sets - 30 hold  Access Code: [email or web address] URL: [email or web address] Date: 08/28/2023 Prepared by: Massie Dollar  Exercises - Supine Lower  Trunk Rotation  - 1 x daily - 7 x weekly - 3 sets - 10 reps - Supine Bridge  - 1 x daily - 7 x weekly - 3 sets - 10 reps - Supine Piriformis Stretch with Foot on Ground  - 1 x daily - 7 x weekly - 3 sets - 10 reps - Supine Figure 4 Piriformis Stretch  - 1 x daily - 7 x weekly - 3 sets - 10 reps  ASSESSMENT:  CLINICAL IMPRESSION:    Patient continuing to respond well to interventions prescribed in today's session.  At end of session patient's gait quality showed signs of improvement from when patient walked in his initial gait.  Patient instructed to continue to practice this as well as to continue with his home exercise program to further accelerate his progress.  Patient still has difficulty with the right knee terminal extension with gait as well as with hip extension bilaterally at times. Pt will continue to benefit from skilled physical therapy intervention to address impairments, improve QOL, and attain  therapy goals.    OBJECTIVE IMPAIRMENTS: Abnormal gait, decreased activity tolerance, decreased balance, decreased coordination, decreased endurance, decreased mobility, difficulty walking, decreased ROM, decreased strength, hypomobility, postural dysfunction, and pain.   ACTIVITY LIMITATIONS: carrying, lifting, bending, sitting, standing, squatting, sleeping, stairs, transfers, and bed mobility  PARTICIPATION LIMITATIONS: meal prep, cleaning, laundry, shopping, community activity, and yard work  PERSONAL FACTORS: Age, Time since onset of injury/illness/exacerbation, and 3+ comorbidities: OA, DDD  Previous surgeries,  are also affecting patient's functional outcome.   REHAB POTENTIAL: Good  CLINICAL DECISION MAKING: Evolving/moderate complexity  EVALUATION COMPLEXITY: Moderate   GOALS: Goals reviewed with patient? Yes  SHORT TERM GOALS: Target date: 10/07/2023  Pt will be independent with HEP in order to improve strength and decrease back pain in order to improve pain-free function at home. Baseline: EVAL= No formal HEP in place Goal status: INITIAL   LONG TERM GOALS: Target date: 11/18/2023  Pt will decrease mODI score by at least 8 points in order demonstrate clinically significant reduction in back pain/disability. Baseline: EVAL = 24 Goal status: INITIAL  2.  Pt will decrease worst back pain as reported on NPRS by at least 2 points in order to demonstrate clinically significant reduction in back pain.  Baseline: Worst low back pain=6/10 Goal status: INITIAL  3.  Pt will improve BERG by at least 3 points in order to demonstrate clinically significant improvement in balance.  Baseline: 47/56  Goal status: INITIAL  4.  Pt will improve ABC by at least 13% in order to demonstrate clinically significant improvement in balance confidence Baseline: EVAL= 58.75% Goal status: INITIAL  5.  Pt will decrease 5TSTS by at least 3 seconds in order to demonstrate clinically  significant improvement in LE strength. Baseline: 14.07 sec without UE Goal status: INITIAL  6.  Pt will increase by at least 47m (142ft) in order to demonstrate clinically significant improvement in cardiopulmonary endurance and community ambulation  Baseline: 870ft with no AD Goal status: INITIAL  PLAN:  PT FREQUENCY: 1-2x/week  PT DURATION: 12 weeks  PLANNED INTERVENTIONS: [id number]- PT Re-evaluation, 97750- Physical Performance Testing, 97110-Therapeutic exercises, 97530- Therapeutic activity, [id number]- Neuromuscular re-education, 97535- Self Care, [id number]- Manual therapy, [id number]- Gait training, [phone]- Orthotic Initial, [phone]- Orthotic/Prosthetic subsequent, [phone]- Canalith repositioning, [id number]- Electrical stimulation (manual), [phone] (1-2 muscles), [id number] (3+ muscles)- Dry Needling, Patient/Family education, Balance training, Stair training, Taping, Joint mobilization, Joint manipulation, Spinal manipulation, Spinal mobilization, Vestibular training, DME instructions, Cryotherapy,  and Moist heat.  PLAN FOR NEXT SESSION:   -Low back, thoracic, LE stretching  - open book stretches  - thoracic extension -Gait: address decreased knee extension, heel strike, guarded position leading to decreased arm swing & trunk rotation  -assess footwear (Add any balance, LE/low back/core strengthening to HEP)   Note: Portions of this document were prepared using Dragon voice recognition software and although reviewed may contain unintentional dictation errors in syntax, grammar, or spelling.  Lonni KATHEE Gainer PT ,DPT Physical Therapist- [facility]   09/11/23, 9:26 AM

## 2023-09-17 ENCOUNTER — Ambulatory Visit: Admitting: Physical Therapy

## 2023-09-17 DIAGNOSIS — M6281 Muscle weakness (generalized): Secondary | ICD-10-CM

## 2023-09-17 DIAGNOSIS — M5459 Other low back pain: Secondary | ICD-10-CM

## 2023-09-17 DIAGNOSIS — R269 Unspecified abnormalities of gait and mobility: Secondary | ICD-10-CM | POA: Diagnosis not present

## 2023-09-17 DIAGNOSIS — R2681 Unsteadiness on feet: Secondary | ICD-10-CM

## 2023-09-17 NOTE — Therapy (Signed)
 OUTPATIENT PHYSICAL THERAPY THORACOLUMBAR TREATMENT   Patient Name: Carlos Neal. MRN: [id number] DOB:10-Nov-1940, 83 y.o., male Today's Date: 09/17/2023  END OF SESSION:    PT End of Session - 09/17/23 1316     Visit Number 6    Number of Visits 24    Date for PT Re-Evaluation 11/18/23    Progress Note Due on Visit 10    PT Start Time 1318    PT Stop Time 1358    PT Time Calculation (min) 40 min    Equipment Utilized During Treatment Gait belt    Activity Tolerance Patient tolerated treatment well    Behavior During Therapy WFL for tasks assessed/performed              Past Medical History:  Diagnosis Date   Anemia    Asthma    as a child   GERD (gastroesophageal reflux disease)    Hypertension    Osteoarthritis    back and knee   Osteopenia    Osteoporosis    Vitamin D  deficiency    Past Surgical History:  Procedure Laterality Date   BROW LIFT Bilateral 09/23/2020   Procedure: BLEPHAROPTOSIS REPAIR; RESECT EX  BROW PTOSIS REPAIR ECTROPION REPAIR, EXTENSIVE BILATERAL;  Surgeon: Ashley Greig HERO, MD;  Location: [facility];  Service: Ophthalmology;  Laterality: Bilateral;  wants to be later in the schedule   COLONOSCOPY  01/12/2013   COLONOSCOPY N/A 01/05/2022   Procedure: COLONOSCOPY;  Surgeon: Toledo, Ladell POUR, MD;  Location: [facility];  Service: Gastroenterology;  Laterality: N/A;   ESOPHAGOGASTRODUODENOSCOPY (EGD) WITH PROPOFOL  N/A 01/05/2022   Procedure: ESOPHAGOGASTRODUODENOSCOPY (EGD) WITH PROPOFOL ;  Surgeon: Toledo, Ladell POUR, MD;  Location: [facility];  Service: Gastroenterology;  Laterality: N/A;   GIVENS CAPSULE STUDY N/A 01/05/2022   Procedure: GIVENS CAPSULE STUDY;  Surgeon: Toledo, Ladell POUR, MD;  Location: [facility];  Service: Gastroenterology;  Laterality: N/A;   HYDROCELE EXCISION     age 69   INGUINAL HERNIA REPAIR Bilateral    as a child   KNEE ARTHROPLASTY Right 01/30/2021   Procedure: COMPUTER ASSISTED TOTAL KNEE  ARTHROPLASTY;  Surgeon: Mardee Lynwood SQUIBB, MD;  Location: [facility];  Service: Orthopedics;  Laterality: Right;   LUMBAR LAMINECTOMY/ DECOMPRESSION WITH MET-RX Right 01/15/2020   Procedure: L2-3 DECOMPRESSION, RIGHT L2-3 FAR LATERAL DISCECTOMY;  Surgeon: Clois Fret, MD;  Location: [facility];  Service: Neurosurgery;  Laterality: Right;  2nd case   TOTAL HIP ARTHROPLASTY Left 12/04/2021   Procedure: TOTAL HIP ARTHROPLASTY;  Surgeon: Mardee Lynwood SQUIBB, MD;  Location: [facility];  Service: Orthopedics;  Laterality: Left;   Patient Active Problem List   Diagnosis Date Noted   Senile purpura (HCC) 09/05/2023   IDA (iron  deficiency anemia) 01/11/2022   AVM (arteriovenous malformation) 01/11/2022   Symptomatic anemia 01/03/2022   Positive D dimer 01/03/2022   H/O total hip arthroplasty, left 12/04/21 12/04/2021   Chronic left shoulder pain 11/09/2021   Primary osteoarthritis of left hip 08/22/2021   Status post total right knee replacement 01/30/2021   B12 deficiency 03/16/2020   BPH associated with nocturia 03/16/2020   Chronic pain syndrome 12/23/2019   Pharmacologic therapy 12/23/2019   Disorder of skeletal system 12/23/2019   Problems influencing health status 12/23/2019   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HCC) 12/23/2019   Chronic lower extremity pain (1ry area of Pain) (Right) 12/23/2019   Chronic low back pain (2ry area of Pain) (Bilateral) (R>L) w/ sciatica (Right) 12/23/2019   Abnormal MRI, lumbar spine (  12/04/2019) 12/23/2019   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) 12/23/2019   Retrolisthesis (T12-L1, L1-2) 12/23/2019   Lumbosacral foraminal stenosis 12/23/2019   Lumbar facet arthropathy 12/23/2019   Osteoarthritis of facet joint of lumbar spine 12/23/2019   Lumbar facet syndrome (Bilateral) 12/23/2019   Wedge compression fracture of T12 vertebra, sequela 12/23/2019   DDD (degenerative disc disease), lumbosacral 12/23/2019   DDD (degenerative disc disease), cervical 12/23/2019    Cervical facet syndrome (Right) 12/23/2019   Cervicalgia 12/23/2019   Chronic neck pain (Right) 12/23/2019   Essential hypertension 12/20/2019   GERD without esophagitis 12/20/2019   Hereditary hemochromatosis (HCC) 12/20/2019   Impotence 12/20/2019   Insomnia 12/20/2019   Osteopenia 12/20/2019   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 Alda Carpen  REFERRING PROVIDER: Dr. Alda Carpen  REFERRING DIAG:  [phone] (ICD-10-CM) - Spinal stenosis, lumbar region, without neurogenic claudication  R26.81 (ICD-10-CM) - Unsteadiness on feet    Rationale for Evaluation and Treatment: Rehabilitation  THERAPY DIAG:   Abnormality of gait and mobility  Muscle weakness (generalized)  Unsteadiness on feet  Other low back pain  ONSET DATE: 2022  SUBJECTIVE:                                                                                                                                                                                           SUBJECTIVE STATEMENT:      Pt reports no significant changes since last session. Has been working on exercises.   From EVALUATION:  I just can't get my walking like I want to. I'm unsteady and have had recent hip, knee and back surgeries back in 2022 and 2023. My main concern is to get my walking better.   PERTINENT HISTORY:  Per most recent PCP Visit note on 08/20/2023: Spinal stenosis of lumbar region without neurogenic claudication Slightly improved but still symptomatic. Patient with unstable gait and lack of strength. Plan to refer to physical therapy for evaluation   PAIN:  Are you having pain? Yes: NPRS scale: current 0/10; worst=6/10 Pain  location: Right lower back Pain description: ache and sharp with walking Aggravating factors: getting up in morning, walking in morning-does improve throughout the day Relieving factors: Tylenol , heat  PRECAUTIONS: Fall  RED FLAGS: Bowel or bladder incontinence: Yes:     WEIGHT BEARING RESTRICTIONS: No  FALLS:  Has patient fallen in last 6 months? No  LIVING ENVIRONMENT: Lives with: lives with their spouse Lives in: House/apartment Stairs: Yes: External: 3 steps; can reach both Has following equipment at home: Single point cane, Walker - 2 wheeled, and Grab bars  OCCUPATION: Retired - 2019.  [employer] and worked at [employer] prior to that.   PLOF: Independent  PATIENT GOALS: Improve my walking  NEXT MD VISIT: Dr. Babara (hematologist)   OBJECTIVE:  Note: Objective measures were completed at Evaluation unless otherwise noted.  DIAGNOSTIC FINDINGS:  CLINICAL DATA:  Lumbar surgery L2-3 on 01/15/2020. Continue low back pain with right upper leg pain.   EXAM: MRI LUMBAR SPINE WITHOUT AND WITH CONTRAST   TECHNIQUE: Multiplanar and multiecho pulse sequences of the lumbar spine were obtained without and with intravenous contrast.   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   2 mm retrolisthesis L1-2. 4 mm retrolisthesis T12-L1. 4 mm retrolisthesis L1-2. Mild anterolisthesis L4-5.   Vertebrae: Negative for fracture or mass. Discogenic edema L1-2 and L2-3 similar to the prior study.   Conus medullaris and cauda equina: Conus extends to the T12-L1 level. Conus and cauda equina appear normal.   Paraspinal and other soft tissues: Negative for paraspinous mass. Interval resolution of left paraspinous cyst at the mid L3 level lateral to the foramen.   Disc levels:   T10-11: Probable left foraminal encroachment  due to spurring. No axial images through this level   T11-12: Left paracentral disc protrusion, with progression. Bilateral facet degeneration. Negative for stenosis   T12-L1: Disc degeneration with disc bulging and spurring. No significant stenosis   L1-2: Asymmetric disc degeneration on the right with disc space narrowing and spurring. Bilateral facet hypertrophy. Spinal canal adequate in size. Moderate to severe right subarticular stenosis. Mild left subarticular stenosis. No interval change.   L2-3: Right laminectomy. 12 mm multiloculated fluid collection posterior to the lamina related to recent surgery. Severe facet degeneration. Advanced disc degeneration with disc space narrowing and endplate spurring. Spinal canal now adequate in size with mild narrowing. Moderate to severe subarticular and foraminal stenosis bilaterally right greater than left due to spurring is similar to the prior study.   L3-4: Diffuse disc bulging and bilateral facet degeneration. Mild subarticular stenosis bilaterally.   L4-5: Mild anterolisthesis. Asymmetric disc degeneration on the left with disc space narrowing and spurring. Advanced facet degeneration bilaterally. Moderate subarticular and foraminal stenosis on the left. Mild to moderate right subarticular stenosis   L5-S1: Bilateral facet degeneration. Mild right subarticular stenosis due to spurring.   IMPRESSION: Multilevel degenerative change throughout the lumbar spine most severe at L1-2 and L2-3.   Interval right laminectomy at L2-3 with improvement in canal stenosis. There remains moderate to severe subarticular stenosis bilaterally right greater than left. Interval resolution of cyst lateral to the L3 vertebral body.     Electronically Signed   By: Carlin Gaskins M.D.   On: 04/04/2020 11:52    PATIENT SURVEYS:  ABC scale: The Activities-Specific Balance Confidence (ABC) Scale 0% 10 20 30  40 50 60 70 80 90 100% No  confidence<->completely confident  "How confident are you that you will not lose your balance or become unsteady when you . . .   Date tested 08/26/2023  Walk around the house 90%  2. Walk up or down stairs 50%  3. Bend over and pick up a Teacher, music  from in front of a closet floor 50%  4. Reach for a small can off a shelf at eye level 70%  5. Stand on tip toes and reach for something above your head 40%  6. Stand on a chair and reach for something 10%  7. Sweep the floor 50%  8. Walk outside the house to a car parked in the driveway 90%  9. Get into or out of a car 80%  10. Walk across a parking lot to the mall 70%  11. Walk up or down a ramp 60%  12. Walk in a crowded mall where people rapidly walk past you 70%  13. Are bumped into by people as you walk through the mall 70%  14. Step onto or off of an escalator while you are holding onto the railing 80%  15. Step onto or off an escalator while holding onto parcels such that you cannot hold onto the railing 50%  16. Walk outside on icy sidewalks 10%  Total: #/16 58.75%    Modified Oswestry:  MODIFIED OSWESTRY DISABILITY SCALE  Date: 08/26/2023 Score  Pain intensity 3 =  Pain medication provides me with moderate relief from pain.  2. Personal care (washing, dressing, etc.) 3 =  I need help, but I am able to manage most of my personal care.  3. Lifting 3 = Pain prevents me from lifting heavy weights, but I can manage (5) I have hardly any social life because of my pain. light to medium weights if they are conveniently positioned  4. Walking 2 =  Pain prevents me from walking more than  mile.  5. Sitting 2 =  Pain prevents me from sitting more than 1 hour.  6. Standing 1 =  I can stand as long as I want but, it increases my pain.  7. Sleeping 0 = Pain does not prevent me from sleeping well.  8. Social Life 0 = My social life is normal and does not increase my pain.  9. Traveling 2 =  My pain restricts my travel over 2 hours.  10.  Employment/ Homemaking 1 = My normal homemaking/job activities increase my pain, but I can still perform all that is required of me  Total 17/50=24%   Interpretation of scores: Score Category Description  0-20% Minimal Disability The patient can cope with most living activities. Usually no treatment is indicated apart from advice on lifting, sitting and exercise  21-40% Moderate Disability The patient experiences more pain and difficulty with sitting, lifting and standing. Travel and social life are more difficult and they may be disabled from work. Personal care, sexual activity and sleeping are not grossly affected, and the patient can usually be managed by conservative means  41-60% Severe Disability Pain remains the main problem in this group, but activities of daily living are affected. These patients require a detailed investigation  61-80% Crippled Back pain impinges on all aspects of the patient's life. Positive intervention is required  81-100% Bed-bound  These patients are either bed-bound or exaggerating their symptoms  Bluford FORBES Zoe DELENA Karon DELENA, et al. Surgery versus conservative management of stable thoracolumbar fracture: the PRESTO feasibility RCT. [place]): [employer]; 2021 Nov. [facility] Technology Assessment, No. 25.62.) Appendix 3, Oswestry Disability Index category descriptors. Available from: FindJewelers.cz  Minimally Clinically Important Difference (MCID) = 12.8%  COGNITION: Overall cognitive status: Within functional limits for tasks assessed     SENSATION: WFL   POSTURE: rounded shoulders and forward head  PALPATION: (+) pain in right lumbar paraspinals  LUMBAR ROM:   AROM eval  Flexion Finger tip to toes with knees straight  Extension WNL  Right lateral flexion Finger tips to mid fibula   Left lateral flexion Finger tips to mid   Right rotation   Left rotation    (Blank rows = not tested)  LOWER  EXTREMITY ROM:     Active  Right eval Left eval  Hip flexion    Hip extension    Hip abduction    Hip adduction    Hip internal rotation    Hip external rotation    Knee flexion    Knee extension    Ankle dorsiflexion    Ankle plantarflexion    Ankle inversion    Ankle eversion     (Blank rows = not tested)  LOWER EXTREMITY MMT:    MMT Right eval Left eval  Hip flexion 4 4  Hip extension 4 4  Hip abduction 4 4  Hip adduction    Hip internal rotation 4 4  Hip external rotation 4 4  Knee flexion 4+ 4+  Knee extension 4+ 4+  Ankle dorsiflexion 5 5  Ankle plantarflexion    Ankle inversion    Ankle eversion     (Blank rows = not tested)  LUMBAR SPECIAL TESTS:  Slump test: Negative and SI Compression/distraction test: Negative  FUNCTIONAL TESTS:  5 times sit to stand: 14.07 sec without UE Timed up and go (TUG): 14.34 sec Avg 6 minute walk test: 810ft  10 meter walk test: 0.74 m/s avg Berg Balance Scale: 47/56  GAIT: Distance walked: 80 feet Assistive device utilized: None Level of assistance: SBA Comments: Decreased step length, narrowed BOS, forward flexed posture.   TREATMENT DATE: 09/17/2023   TA- To improve functional movements patterns for everyday tasks   Step over 3 x 1/2 foam roll at variable distance apart x 5 laps   STS 2 x 10 reps with postural stretch each rep   Gait:  Ambulation around the gym x2 total laps, ~320' with instruction on heel-to-toe gait pattern and longer strides with 3# AW   Gait in ladder drill x 4 rounds through with 3# AW 1 foot in each rung  -x 3 rounds lateral stepping  - x 3 rounds 2 feet in then retro step back then 2 feet into next rung   NMR: To facilitate reeducation of movement, balance, posture, coordination, and/or proprioception/kinesthetic sense.  Airex step on / off x 10 forward, then lateral to left then lateral to R   PATIENT EDUCATION:  Education details: Purpose of PT; Plan of care; Anatomy of lumbar  spine; Instruction in HEP Person educated: Patient Education method: Explanation, Demonstration, Tactile cues, Verbal cues, and Handouts Education comprehension: verbalized understanding, returned demonstration, verbal cues required, tactile cues required, and needs further education  HOME EXERCISE PROGRAM: Access Code: [email or web address] URL: [email or web address] Date: 08/26/2023 Prepared by: Reyes London  Exercises - Seated Lumbar Flexion Stretch  - 1 x daily - 3 sets - 30 hold - Seated Hamstring Stretch  - 1 x daily - 3 sets - 30 hold  Access Code: [email or web address] URL: [email or web address] Date: 08/28/2023 Prepared by: Massie Dollar  Exercises - Supine Lower Trunk Rotation  - 1 x daily - 7 x weekly - 3 sets - 10 reps - Supine Bridge  - 1 x daily - 7 x weekly - 3 sets - 10 reps - Supine Piriformis Stretch with Foot on Ground  -  1 x daily - 7 x weekly - 3 sets - 10 reps - Supine Figure 4 Piriformis Stretch  - 1 x daily - 7 x weekly - 3 sets - 10 reps  ASSESSMENT:  CLINICAL IMPRESSION:    Patient continuing to respond well to interventions prescribed in today's session.   Patient instructed to continue to practice this as well as to continue with his home exercise program to further accelerate his progress.  Patient challenged with gait interventions utilizing speed ladder at this date working on both coordination and foot clearance with these interventions.  With all interventions patient shows improvement with practice showing good adaptability and good overall ability to progress with physical therapy.  Pt will continue to benefit from skilled physical therapy intervention to address impairments, improve QOL, and attain therapy goals.    OBJECTIVE IMPAIRMENTS: Abnormal gait, decreased activity tolerance, decreased balance, decreased coordination, decreased endurance, decreased mobility, difficulty walking, decreased ROM, decreased strength, hypomobility,  postural dysfunction, and pain.   ACTIVITY LIMITATIONS: carrying, lifting, bending, sitting, standing, squatting, sleeping, stairs, transfers, and bed mobility  PARTICIPATION LIMITATIONS: meal prep, cleaning, laundry, shopping, community activity, and yard work  PERSONAL FACTORS: Age, Time since onset of injury/illness/exacerbation, and 3+ comorbidities: OA, DDD  Previous surgeries,  are also affecting patient's functional outcome.   REHAB POTENTIAL: Good  CLINICAL DECISION MAKING: Evolving/moderate complexity  EVALUATION COMPLEXITY: Moderate   GOALS: Goals reviewed with patient? Yes  SHORT TERM GOALS: Target date: 10/07/2023  Pt will be independent with HEP in order to improve strength and decrease back pain in order to improve pain-free function at home. Baseline: EVAL= No formal HEP in place Goal status: INITIAL   LONG TERM GOALS: Target date: 11/18/2023  Pt will decrease mODI score by at least 8 points in order demonstrate clinically significant reduction in back pain/disability. Baseline: EVAL = 24 Goal status: INITIAL  2.  Pt will decrease worst back pain as reported on NPRS by at least 2 points in order to demonstrate clinically significant reduction in back pain.  Baseline: Worst low back pain=6/10 Goal status: INITIAL  3.  Pt will improve BERG by at least 3 points in order to demonstrate clinically significant improvement in balance.  Baseline: 47/56  Goal status: INITIAL  4.  Pt will improve ABC by at least 13% in order to demonstrate clinically significant improvement in balance confidence Baseline: EVAL= 58.75% Goal status: INITIAL  5.  Pt will decrease 5TSTS by at least 3 seconds in order to demonstrate clinically significant improvement in LE strength. Baseline: 14.07 sec without UE Goal status: INITIAL  6.  Pt will increase by at least 62m (164ft) in order to demonstrate clinically significant improvement in cardiopulmonary endurance and community  ambulation  Baseline: 81ft with no AD Goal status: INITIAL  PLAN:  PT FREQUENCY: 1-2x/week  PT DURATION: 12 weeks  PLANNED INTERVENTIONS: 97164- PT Re-evaluation, 97750- Physical Performance Testing, 97110-Therapeutic exercises, 97530- Therapeutic activity, [id number]- Neuromuscular re-education, 97535- Self Care, [id number]- Manual therapy, [id number]- Gait training, [phone]- Orthotic Initial, [id number]- Orthotic/Prosthetic subsequent, [phone]- Canalith repositioning, [id number]- Electrical stimulation (manual), [phone] (1-2 muscles), [id number] (3+ muscles)- Dry Needling, Patient/Family education, Balance training, Stair training, Taping, Joint mobilization, Joint manipulation, Spinal manipulation, Spinal mobilization, Vestibular training, DME instructions, Cryotherapy, and Moist heat.  PLAN FOR NEXT SESSION:   -Low back, thoracic, LE stretching  - open book stretches  - thoracic extension -Gait: address decreased knee extension, heel strike, guarded position leading to decreased arm swing & trunk rotation  -  assess footwear (Add any balance, LE/low back/core strengthening to HEP)   Note: Portions of this document were prepared using Dragon voice recognition software and although reviewed may contain unintentional dictation errors in syntax, grammar, or spelling.  Lonni KATHEE Gainer PT ,DPT Physical Therapist- [facility]   09/17/23, 1:16 PM

## 2023-09-19 ENCOUNTER — Ambulatory Visit

## 2023-09-20 ENCOUNTER — Ambulatory Visit: Admitting: Physical Therapy

## 2023-09-20 DIAGNOSIS — R2689 Other abnormalities of gait and mobility: Secondary | ICD-10-CM

## 2023-09-20 DIAGNOSIS — R269 Unspecified abnormalities of gait and mobility: Secondary | ICD-10-CM | POA: Diagnosis not present

## 2023-09-20 DIAGNOSIS — M6281 Muscle weakness (generalized): Secondary | ICD-10-CM

## 2023-09-20 DIAGNOSIS — M5459 Other low back pain: Secondary | ICD-10-CM

## 2023-09-20 DIAGNOSIS — R262 Difficulty in walking, not elsewhere classified: Secondary | ICD-10-CM

## 2023-09-20 DIAGNOSIS — R2681 Unsteadiness on feet: Secondary | ICD-10-CM

## 2023-09-20 NOTE — Therapy (Signed)
 OUTPATIENT PHYSICAL THERAPY THORACOLUMBAR TREATMENT   Patient Name: Carlos Neal. MRN: [id number] DOB:[birth year], 83 y.o., male Today's Date: 09/20/2023  END OF SESSION:    PT End of Session - 09/20/23 0955     Visit Number 7    Number of Visits 24    Date for Recertification  11/18/23    Progress Note Due on Visit 10    PT Start Time 1010    PT Stop Time 1052    PT Time Calculation (min) 42 min    Equipment Utilized During Treatment Gait belt    Activity Tolerance Patient tolerated treatment well    Behavior During Therapy WFL for tasks assessed/performed               Past Medical History:  Diagnosis Date   Anemia    Asthma    as a child   GERD (gastroesophageal reflux disease)    Hypertension    Osteoarthritis    back and knee   Osteopenia    Osteoporosis    Vitamin D  deficiency    Past Surgical History:  Procedure Laterality Date   BROW LIFT Bilateral 09/23/2020   Procedure: BLEPHAROPTOSIS REPAIR; RESECT EX  BROW PTOSIS REPAIR ECTROPION REPAIR, EXTENSIVE BILATERAL;  Surgeon: Ashley Greig HERO, MD;  Location: [facility];  Service: Ophthalmology;  Laterality: Bilateral;  wants to be later in the schedule   COLONOSCOPY  01/12/2013   COLONOSCOPY N/A 01/05/2022   Procedure: COLONOSCOPY;  Surgeon: Toledo, Ladell POUR, MD;  Location: [facility];  Service: Gastroenterology;  Laterality: N/A;   ESOPHAGOGASTRODUODENOSCOPY (EGD) WITH PROPOFOL  N/A 01/05/2022   Procedure: ESOPHAGOGASTRODUODENOSCOPY (EGD) WITH PROPOFOL ;  Surgeon: Toledo, Ladell POUR, MD;  Location: [facility];  Service: Gastroenterology;  Laterality: N/A;   GIVENS CAPSULE STUDY N/A 01/05/2022   Procedure: GIVENS CAPSULE STUDY;  Surgeon: Toledo, Ladell POUR, MD;  Location: [facility];  Service: Gastroenterology;  Laterality: N/A;   HYDROCELE EXCISION     age 14   INGUINAL HERNIA REPAIR Bilateral    as a child   KNEE ARTHROPLASTY Right 01/30/2021   Procedure: COMPUTER ASSISTED TOTAL KNEE  ARTHROPLASTY;  Surgeon: Mardee Lynwood SQUIBB, MD;  Location: [facility];  Service: Orthopedics;  Laterality: Right;   LUMBAR LAMINECTOMY/ DECOMPRESSION WITH MET-RX Right 01/15/2020   Procedure: L2-3 DECOMPRESSION, RIGHT L2-3 FAR LATERAL DISCECTOMY;  Surgeon: Clois Fret, MD;  Location: [facility];  Service: Neurosurgery;  Laterality: Right;  2nd case   TOTAL HIP ARTHROPLASTY Left 12/04/2021   Procedure: TOTAL HIP ARTHROPLASTY;  Surgeon: Mardee Lynwood SQUIBB, MD;  Location: [facility];  Service: Orthopedics;  Laterality: Left;   Patient Active Problem List   Diagnosis Date Noted   Senile purpura (HCC) 09/05/2023   IDA (iron  deficiency anemia) 01/11/2022   AVM (arteriovenous malformation) 01/11/2022   Symptomatic anemia 01/03/2022   Positive D dimer 01/03/2022   H/O total hip arthroplasty, left 12/04/21 12/04/2021   Chronic left shoulder pain 11/09/2021   Primary osteoarthritis of left hip 08/22/2021   Status post total right knee replacement 01/30/2021   B12 deficiency 03/16/2020   BPH associated with nocturia 03/16/2020   Chronic pain syndrome 12/23/2019   Pharmacologic therapy 12/23/2019   Disorder of skeletal system 12/23/2019   Problems influencing health status 12/23/2019   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HCC) 12/23/2019   Chronic lower extremity pain (1ry area of Pain) (Right) 12/23/2019   Chronic low back pain (2ry area of Pain) (Bilateral) (R>L) w/ sciatica (Right) 12/23/2019   Abnormal MRI, lumbar  spine (12/04/2019) 12/23/2019   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) 12/23/2019   Retrolisthesis (T12-L1, L1-2) 12/23/2019   Lumbosacral foraminal stenosis 12/23/2019   Lumbar facet arthropathy 12/23/2019   Osteoarthritis of facet joint of lumbar spine 12/23/2019   Lumbar facet syndrome (Bilateral) 12/23/2019   Wedge compression fracture of T12 vertebra, sequela 12/23/2019   DDD (degenerative disc disease), lumbosacral 12/23/2019   DDD (degenerative disc disease), cervical 12/23/2019    Cervical facet syndrome (Right) 12/23/2019   Cervicalgia 12/23/2019   Chronic neck pain (Right) 12/23/2019   Essential hypertension 12/20/2019   GERD without esophagitis 12/20/2019   Hereditary hemochromatosis (HCC) 12/20/2019   Impotence 12/20/2019   Insomnia 12/20/2019   Osteopenia 12/20/2019   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 Alda Carpen  REFERRING PROVIDER: Dr. Alda Carpen  REFERRING DIAG:  [phone] (ICD-10-CM) - Spinal stenosis, lumbar region, without neurogenic claudication  R26.81 (ICD-10-CM) - Unsteadiness on feet    Rationale for Evaluation and Treatment: Rehabilitation  THERAPY DIAG:   No diagnosis found.  ONSET DATE: 2022  SUBJECTIVE:                                                                                                                                                                                           SUBJECTIVE STATEMENT:      Pt reports no significant changes since last session. Has been working on exercises. 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   From EVALUATION:  I just can't get my walking like I want to. I'm unsteady and have had recent hip, knee and back surgeries back in 2022 and 2023. My main concern is to get my walking better.   PERTINENT HISTORY:  Per most recent PCP Visit note on 08/20/2023: Spinal stenosis of lumbar region without neurogenic claudication Slightly improved but still symptomatic. Patient with unstable gait and lack of strength. Plan to refer to physical therapy for evaluation   PAIN:  Are you having pain? Yes: NPRS scale: current 0/10; worst=6/10 Pain location:  Right lower back Pain description: ache and sharp with walking Aggravating factors: getting up in morning, walking in morning-does improve throughout the day Relieving factors: Tylenol , heat  PRECAUTIONS: Fall  RED FLAGS: Bowel or bladder incontinence: Yes:     WEIGHT BEARING RESTRICTIONS: No  FALLS:  Has patient fallen in last 6 months? No  LIVING ENVIRONMENT: Lives with: lives with their spouse Lives in: House/apartment Stairs: Yes: External: 3 steps; can reach both Has following equipment at home: Single point cane, Walker - 2 wheeled, and Grab bars  OCCUPATION: Retired - 2019.  [employer] and worked at [employer] prior to that.   PLOF: Independent  PATIENT GOALS: Improve my walking  NEXT MD VISIT: Dr. Babara (hematologist)   OBJECTIVE:  Note: Objective measures were completed at Evaluation unless otherwise noted.  DIAGNOSTIC FINDINGS:  CLINICAL DATA:  Lumbar surgery L2-3 on 01/15/2020. Continue low back pain with right upper leg pain.   EXAM: MRI LUMBAR SPINE WITHOUT AND WITH CONTRAST   TECHNIQUE: Multiplanar and multiecho pulse sequences of the lumbar spine were obtained without and with intravenous contrast.   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   2 mm retrolisthesis L1-2. 4 mm retrolisthesis T12-L1. 4 mm retrolisthesis L1-2. Mild anterolisthesis L4-5.   Vertebrae: Negative for fracture or mass. Discogenic edema L1-2 and L2-3 similar to the prior study.   Conus medullaris and cauda equina: Conus extends to the T12-L1 level. Conus and cauda equina appear normal.   Paraspinal and other soft tissues: Negative for paraspinous mass. Interval resolution of left paraspinous cyst at the mid L3 level lateral to the foramen.   Disc levels:   T10-11: Probable left foraminal encroachment due to  spurring. No axial images through this level   T11-12: Left paracentral disc protrusion, with progression. Bilateral facet degeneration. Negative for stenosis   T12-L1: Disc degeneration with disc bulging and spurring. No significant stenosis   L1-2: Asymmetric disc degeneration on the right with disc space narrowing and spurring. Bilateral facet hypertrophy. Spinal canal adequate in size. Moderate to severe right subarticular stenosis. Mild left subarticular stenosis. No interval change.   L2-3: Right laminectomy. 12 mm multiloculated fluid collection posterior to the lamina related to recent surgery. Severe facet degeneration. Advanced disc degeneration with disc space narrowing and endplate spurring. Spinal canal now adequate in size with mild narrowing. Moderate to severe subarticular and foraminal stenosis bilaterally right greater than left due to spurring is similar to the prior study.   L3-4: Diffuse disc bulging and bilateral facet degeneration. Mild subarticular stenosis bilaterally.   L4-5: Mild anterolisthesis. Asymmetric disc degeneration on the left with disc space narrowing and spurring. Advanced facet degeneration bilaterally. Moderate subarticular and foraminal stenosis on the left. Mild to moderate right subarticular stenosis   L5-S1: Bilateral facet degeneration. Mild right subarticular stenosis due to spurring.   IMPRESSION: Multilevel degenerative change throughout the lumbar spine most severe at L1-2 and L2-3.   Interval right laminectomy at L2-3 with improvement in canal stenosis. There remains moderate to severe subarticular stenosis bilaterally right greater than left. Interval resolution of cyst lateral to the L3 vertebral body.     Electronically Signed   By: Carlin Gaskins M.D.   On: 04/04/2020 11:52    PATIENT SURVEYS:  ABC scale: The Activities-Specific Balance Confidence (ABC) Scale 0% 10 20 30  40 50 60 70 80 90 100% No  confidence<->completely confident  "How confident are you that you will not lose your balance or become unsteady when you . . .   Date tested 08/26/2023  Walk around the house 90%  2. Walk up or down stairs 50%  3. Bend over and pick up a Teacher, music  from in front of a closet floor 50%  4. Reach for a small can off a shelf at eye level 70%  5. Stand on tip toes and reach for something above your head 40%  6. Stand on a chair and reach for something 10%  7. Sweep the floor 50%  8. Walk outside the house to a car parked in the driveway 90%  9. Get into or out of a car 80%  10. Walk across a parking lot to the mall 70%  11. Walk up or down a ramp 60%  12. Walk in a crowded mall where people rapidly walk past you 70%  13. Are bumped into by people as you walk through the mall 70%  14. Step onto or off of an escalator while you are holding onto the railing 80%  15. Step onto or off an escalator while holding onto parcels such that you cannot hold onto the railing 50%  16. Walk outside on icy sidewalks 10%  Total: #/16 58.75%    Modified Oswestry:  MODIFIED OSWESTRY DISABILITY SCALE  Date: 08/26/2023 Score  Pain intensity 3 =  Pain medication provides me with moderate relief from pain.  2. Personal care (washing, dressing, etc.) 3 =  I need help, but I am able to manage most of my personal care.  3. Lifting 3 = Pain prevents me from lifting heavy weights, but I can manage (5) I have hardly any social life because of my pain. light to medium weights if they are conveniently positioned  4. Walking 2 =  Pain prevents me from walking more than  mile.  5. Sitting 2 =  Pain prevents me from sitting more than 1 hour.  6. Standing 1 =  I can stand as long as I want but, it increases my pain.  7. Sleeping 0 = Pain does not prevent me from sleeping well.  8. Social Life 0 = My social life is normal and does not increase my pain.  9. Traveling 2 =  My pain restricts my travel over 2 hours.  10.  Employment/ Homemaking 1 = My normal homemaking/job activities increase my pain, but I can still perform all that is required of me  Total 17/50=24%   Interpretation of scores: Score Category Description  0-20% Minimal Disability The patient can cope with most living activities. Usually no treatment is indicated apart from advice on lifting, sitting and exercise  21-40% Moderate Disability The patient experiences more pain and difficulty with sitting, lifting and standing. Travel and social life are more difficult and they may be disabled from work. Personal care, sexual activity and sleeping are not grossly affected, and the patient can usually be managed by conservative means  41-60% Severe Disability Pain remains the main problem in this group, but activities of daily living are affected. These patients require a detailed investigation  61-80% Crippled Back pain impinges on all aspects of the patient's life. Positive intervention is required  81-100% Bed-bound  These patients are either bed-bound or exaggerating their symptoms  Bluford FORBES Zoe DELENA Karon DELENA, et al. Surgery versus conservative management of stable thoracolumbar fracture: the PRESTO feasibility RCT. [place]): [employer]; 2021 Nov. [facility] Technology Assessment, No. 25.62.) Appendix 3, Oswestry Disability Index category descriptors. Available from: FindJewelers.cz  Minimally Clinically Important Difference (MCID) = 12.8%  COGNITION: Overall cognitive status: Within functional limits for tasks assessed     SENSATION: WFL   POSTURE: rounded shoulders and forward head  PALPATION: (+) pain in right lumbar paraspinals  LUMBAR ROM:   AROM eval  Flexion Finger tip to toes with knees straight  Extension WNL  Right lateral flexion Finger tips to mid fibula   Left lateral flexion Finger tips to mid   Right rotation   Left rotation    (Blank rows = not tested)  LOWER  EXTREMITY ROM:     Active  Right eval Left eval  Hip flexion    Hip extension    Hip abduction    Hip adduction    Hip internal rotation    Hip external rotation    Knee flexion    Knee extension    Ankle dorsiflexion    Ankle plantarflexion    Ankle inversion    Ankle eversion     (Blank rows = not tested)  LOWER EXTREMITY MMT:    MMT Right eval Left eval  Hip flexion 4 4  Hip extension 4 4  Hip abduction 4 4  Hip adduction    Hip internal rotation 4 4  Hip external rotation 4 4  Knee flexion 4+ 4+  Knee extension 4+ 4+  Ankle dorsiflexion 5 5  Ankle plantarflexion    Ankle inversion    Ankle eversion     (Blank rows = not tested)  LUMBAR SPECIAL TESTS:  Slump test: Negative and SI Compression/distraction test: Negative  FUNCTIONAL TESTS:  5 times sit to stand: 14.07 sec without UE Timed up and go (TUG): 14.34 sec Avg 6 minute walk test: 863ft  10 meter walk test: 0.74 m/s avg Berg Balance Scale: 47/56  GAIT: Distance walked: 80 feet Assistive device utilized: None Level of assistance: SBA Comments: Decreased step length, narrowed BOS, forward flexed posture.   TREATMENT DATE: 09/20/2023   TA- To improve functional movements patterns for everyday tasks    STS x 15 reps with postural stretch each rep   Gait:  Ambulation around the gym x2 total laps, ~470' with instruction on heel-to-toe gait pattern and longer strides with 3# AW    NMR: To facilitate reeducation of movement, balance, posture, coordination, and/or proprioception/kinesthetic sense.  Balance obstacle course: step over hurdles, on and off airex, up and down steps, on and over bosu, across airex beam, from airex to step trainer to airex step through pattern 2 x 3 laps   Resisted gait on cable column x 3 laps ea direction (ant. L and R lateral, retro with 12.5# resistnace  - High difficulty with lateral stepping and required cues for proper performance without loss of  balance.  Sidestepping on airex beam x 5 laps ea direction    PATIENT EDUCATION:  Education details: Purpose of PT; Plan of care; Anatomy of lumbar spine; Instruction in HEP Person educated: Patient Education method: Explanation, Demonstration, Tactile cues, Verbal cues, and Handouts Education comprehension: verbalized understanding, returned demonstration, verbal cues required, tactile cues required, and needs further education  HOME EXERCISE PROGRAM: Access Code: [email or web address] URL: [email or web address] Date: 08/26/2023 Prepared by: Reyes London  Exercises - Seated Lumbar Flexion Stretch  - 1 x daily - 3 sets - 30 hold - Seated Hamstring Stretch  - 1 x daily - 3 sets - 30 hold  Access Code: [email or web address] URL: [email or web address] Date: 08/28/2023 Prepared by: Massie Dollar  Exercises - Supine Lower Trunk Rotation  - 1 x daily - 7 x weekly - 3 sets - 10 reps - Supine Bridge  - 1 x daily - 7 x weekly - 3 sets - 10  reps - Supine Piriformis Stretch with Foot on Ground  - 1 x daily - 7 x weekly - 3 sets - 10 reps - Supine Figure 4 Piriformis Stretch  - 1 x daily - 7 x weekly - 3 sets - 10 reps  ASSESSMENT:  CLINICAL IMPRESSION:    Patient continuing to respond well to interventions prescribed in today's session.  Patient challenged with primarily higher level dynamic balance obstacles this date.  Patient responded well to interventions and demonstrated improved balance responses with practice with various objects.  Patient still requires cues for taking longer steps particularly with resisted gait as well as with stepping over obstacles like half foam rollers.  Patient encouraged to continue to practice with home exercises and is self-motivated to do so.  Pt will continue to benefit from skilled physical therapy intervention to address impairments, improve QOL, and attain therapy goals.    OBJECTIVE IMPAIRMENTS: Abnormal gait, decreased activity tolerance,  decreased balance, decreased coordination, decreased endurance, decreased mobility, difficulty walking, decreased ROM, decreased strength, hypomobility, postural dysfunction, and pain.   ACTIVITY LIMITATIONS: carrying, lifting, bending, sitting, standing, squatting, sleeping, stairs, transfers, and bed mobility  PARTICIPATION LIMITATIONS: meal prep, cleaning, laundry, shopping, community activity, and yard work  PERSONAL FACTORS: Age, Time since onset of injury/illness/exacerbation, and 3+ comorbidities: OA, DDD  Previous surgeries,  are also affecting patient's functional outcome.   REHAB POTENTIAL: Good  CLINICAL DECISION MAKING: Evolving/moderate complexity  EVALUATION COMPLEXITY: Moderate   GOALS: Goals reviewed with patient? Yes  SHORT TERM GOALS: Target date: 10/07/2023  Pt will be independent with HEP in order to improve strength and decrease back pain in order to improve pain-free function at home. Baseline: EVAL= No formal HEP in place Goal status: INITIAL   LONG TERM GOALS: Target date: 11/18/2023  Pt will decrease mODI score by at least 8 points in order demonstrate clinically significant reduction in back pain/disability. Baseline: EVAL = 24 Goal status: INITIAL  2.  Pt will decrease worst back pain as reported on NPRS by at least 2 points in order to demonstrate clinically significant reduction in back pain.  Baseline: Worst low back pain=6/10 Goal status: INITIAL  3.  Pt will improve BERG by at least 3 points in order to demonstrate clinically significant improvement in balance.  Baseline: 47/56  Goal status: INITIAL  4.  Pt will improve ABC by at least 13% in order to demonstrate clinically significant improvement in balance confidence Baseline: EVAL= 58.75% Goal status: INITIAL  5.  Pt will decrease 5TSTS by at least 3 seconds in order to demonstrate clinically significant improvement in LE strength. Baseline: 14.07 sec without UE Goal status: INITIAL  6.   Pt will increase by at least 37m (141ft) in order to demonstrate clinically significant improvement in cardiopulmonary endurance and community ambulation  Baseline: 844ft with no AD Goal status: INITIAL  PLAN:  PT FREQUENCY: 1-2x/week  PT DURATION: 12 weeks  PLANNED INTERVENTIONS: [id number]- PT Re-evaluation, 97750- Physical Performance Testing, 97110-Therapeutic exercises, 97530- Therapeutic activity, [id number]- Neuromuscular re-education, 97535- Self Care, [id number]- Manual therapy, [id number]- Gait training, [phone]- Orthotic Initial, [id number]- Orthotic/Prosthetic subsequent, [phone]- Canalith repositioning, [id number]- Electrical stimulation (manual), [phone] (1-2 muscles), [id number] (3+ muscles)- Dry Needling, Patient/Family education, Balance training, Stair training, Taping, Joint mobilization, Joint manipulation, Spinal manipulation, Spinal mobilization, Vestibular training, DME instructions, Cryotherapy, and Moist heat.  PLAN FOR NEXT SESSION:   -Low back, thoracic, LE stretching  - open book stretches  - thoracic extension -Gait: address decreased knee extension, heel  strike, guarded position leading to decreased arm swing & trunk rotation  -assess footwear (Add any balance, LE/low back/core strengthening to HEP)  Note: Portions of this document were prepared using Dragon voice recognition software and although reviewed may contain unintentional dictation errors in syntax, grammar, or spelling.  Lonni KATHEE Gainer PT ,DPT Physical Therapist- [facility]   09/20/23, 9:56 AM

## 2023-09-23 ENCOUNTER — Ambulatory Visit

## 2023-09-23 DIAGNOSIS — R262 Difficulty in walking, not elsewhere classified: Secondary | ICD-10-CM

## 2023-09-23 DIAGNOSIS — R2681 Unsteadiness on feet: Secondary | ICD-10-CM

## 2023-09-23 DIAGNOSIS — R269 Unspecified abnormalities of gait and mobility: Secondary | ICD-10-CM | POA: Diagnosis not present

## 2023-09-23 DIAGNOSIS — M6281 Muscle weakness (generalized): Secondary | ICD-10-CM

## 2023-09-23 DIAGNOSIS — M5459 Other low back pain: Secondary | ICD-10-CM

## 2023-09-23 NOTE — Therapy (Signed)
 OUTPATIENT PHYSICAL THERAPY TREATMENT  Patient Name: Carlos Neal. MRN: [id number] DOB:07-30-1940, 83 y.o., male Today's Date: 09/23/2023  END OF SESSION:    PT End of Session - 09/23/23 1318     Visit Number 8    Number of Visits 24    Date for Recertification  11/18/23    Progress Note Due on Visit 10    PT Start Time 1315    PT Stop Time 1355    PT Time Calculation (min) 40 min    Equipment Utilized During Treatment Gait belt    Activity Tolerance Patient tolerated treatment well    Behavior During Therapy WFL for tasks assessed/performed          Past Medical History:  Diagnosis Date   Anemia    Asthma    as a child   GERD (gastroesophageal reflux disease)    Hypertension    Osteoarthritis    back and knee   Osteopenia    Osteoporosis    Vitamin D  deficiency    Past Surgical History:  Procedure Laterality Date   BROW LIFT Bilateral 09/23/2020   Procedure: BLEPHAROPTOSIS REPAIR; RESECT EX  BROW PTOSIS REPAIR ECTROPION REPAIR, EXTENSIVE BILATERAL;  Surgeon: Ashley Greig HERO, MD;  Location: [facility];  Service: Ophthalmology;  Laterality: Bilateral;  wants to be later in the schedule   COLONOSCOPY  01/12/2013   COLONOSCOPY N/A 01/05/2022   Procedure: COLONOSCOPY;  Surgeon: Toledo, Ladell POUR, MD;  Location: [facility];  Service: Gastroenterology;  Laterality: N/A;   ESOPHAGOGASTRODUODENOSCOPY (EGD) WITH PROPOFOL  N/A 01/05/2022   Procedure: ESOPHAGOGASTRODUODENOSCOPY (EGD) WITH PROPOFOL ;  Surgeon: Toledo, Ladell POUR, MD;  Location: [facility];  Service: Gastroenterology;  Laterality: N/A;   GIVENS CAPSULE STUDY N/A 01/05/2022   Procedure: GIVENS CAPSULE STUDY;  Surgeon: Toledo, Ladell POUR, MD;  Location: [facility];  Service: Gastroenterology;  Laterality: N/A;   HYDROCELE EXCISION     age 73   INGUINAL HERNIA REPAIR Bilateral    as a child   KNEE ARTHROPLASTY Right 01/30/2021   Procedure: COMPUTER ASSISTED TOTAL KNEE ARTHROPLASTY;  Surgeon: Mardee Lynwood SQUIBB, MD;  Location: [facility];  Service: Orthopedics;  Laterality: Right;   LUMBAR LAMINECTOMY/ DECOMPRESSION WITH MET-RX Right 01/15/2020   Procedure: L2-3 DECOMPRESSION, RIGHT L2-3 FAR LATERAL DISCECTOMY;  Surgeon: Clois Fret, MD;  Location: [facility];  Service: Neurosurgery;  Laterality: Right;  2nd case   TOTAL HIP ARTHROPLASTY Left 12/04/2021   Procedure: TOTAL HIP ARTHROPLASTY;  Surgeon: Mardee Lynwood SQUIBB, MD;  Location: [facility];  Service: Orthopedics;  Laterality: Left;   Patient Active Problem List   Diagnosis Date Noted   Senile purpura (HCC) 09/05/2023   IDA (iron  deficiency anemia) 01/11/2022   AVM (arteriovenous malformation) 01/11/2022   Symptomatic anemia 01/03/2022   Positive D dimer 01/03/2022   H/O total hip arthroplasty, left 12/04/21 12/04/2021   Chronic left shoulder pain 11/09/2021   Primary osteoarthritis of left hip 08/22/2021   Status post total right knee replacement 01/30/2021   B12 deficiency 03/16/2020   BPH associated with nocturia 03/16/2020   Chronic pain syndrome 12/23/2019   Pharmacologic therapy 12/23/2019   Disorder of skeletal system 12/23/2019   Problems influencing health status 12/23/2019   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HCC) 12/23/2019   Chronic lower extremity pain (1ry area of Pain) (Right) 12/23/2019   Chronic low back pain (2ry area of Pain) (Bilateral) (R>L) w/ sciatica (Right) 12/23/2019   Abnormal MRI, lumbar spine (12/04/2019) 12/23/2019   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) 12/23/2019   Retrolisthesis (T12-L1, L1-2) 12/23/2019   Lumbosacral foraminal stenosis 12/23/2019   Lumbar facet arthropathy 12/23/2019   Osteoarthritis of facet joint of lumbar spine 12/23/2019   Lumbar facet syndrome (Bilateral) 12/23/2019   Wedge compression fracture of T12 vertebra, sequela 12/23/2019   DDD (degenerative disc disease), lumbosacral 12/23/2019   DDD (degenerative disc disease), cervical 12/23/2019   Cervical facet syndrome (Right)  12/23/2019   Cervicalgia 12/23/2019   Chronic neck pain (Right) 12/23/2019   Essential hypertension 12/20/2019   GERD without esophagitis 12/20/2019   Hereditary hemochromatosis (HCC) 12/20/2019   Impotence 12/20/2019   Insomnia 12/20/2019   Osteopenia 12/20/2019   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 Alda Carpen  REFERRING PROVIDER: Dr. Alda Carpen  REFERRING DIAG:  [phone] (ICD-10-CM) - Spinal stenosis, lumbar region, without neurogenic claudication  R26.81 (ICD-10-CM) - Unsteadiness on feet    Rationale for Evaluation and Treatment: Rehabilitation  THERAPY DIAG:   Abnormality of gait and mobility  Muscle weakness (generalized)  Unsteadiness on feet  Other low back pain  Difficulty in walking, not elsewhere classified  ONSET DATE: 2022  SUBJECTIVE:                                                                                                                                                                                           SUBJECTIVE STATEMENT:    Pt reports things are going well in general. He is still stiff in the morning, but it is improving with heat and tylenol  as needed.   PERTINENT HISTORY:  Per most recent PCP Visit note on 08/20/2023: Spinal stenosis of lumbar region without neurogenic claudication. Slightly improved but still symptomatic. Patient with unstable gait and lack of strength. Plan to refer to physical therapy for evaluation   PAIN:  Are you having pain? No pain   PRECAUTIONS: Fall  WEIGHT BEARING RESTRICTIONS: No  FALLS:  Has patient fallen in last 6 months? No  LIVING ENVIRONMENT: Lives with: lives with their spouse Lives in:  House/apartment Stairs: Yes: External: 3 steps; can reach both Has following equipment at home: Single point cane, Walker - 2 wheeled, and Grab bars  OCCUPATION: Retired - 2019.  [employer] and worked at [employer] prior to that.   PLOF: Independent  PATIENT GOALS: Improve my walking  NEXT MD VISIT: Dr. Babara (hematologist)   OBJECTIVE:  Note: Objective measures were completed at evaluation unless otherwise noted.  FUNCTIONAL TESTS:  5 times sit to stand: 14.07 sec without UE Timed up and go (TUG): 14.34 sec Avg 6 minute walk test: 874ft  10 meter walk test: 0.74 m/s avg Berg Balance Scale: 47/56   TREATMENT DATE: 09/23/2023  -STS x15 with standing power pose extension stretch  -repeated extension over chairback x15 (seated on airex pad) -STS x15 with standing power pose extension stretch -resisted gait on cable column x3 laps ea direction (ant. L and R lateral, retro with 12.5# resistnace (more difficulty with Rt side stepping but improves with practice.)  -ambulation around the gym x3 total laps with 3# AW, 456ft in 3:34 -standing with theraband trunk twists x15 [place] double theratube length (minGuard assit)  -HIT sled push in hall 45lb chair, 72ft (max speed with breaks between) x4 (22sec, 16sec, 15sec, 15sec)  -fwd step up, fwd step down, turn around repat (carrying 5kg bal) x12   PATIENT EDUCATION:  Education details: exercise modification to avoid pain in shoulders.  Person educated: Patient Education method: Explanation, Demonstration, Tactile cues, Verbal cues, and Handouts Education comprehension: verbalized understanding, returned demonstration, verbal cues required, tactile cues required, and needs further education  HOME EXERCISE PROGRAM: Access Code: [email or web address] URL: [email or web address] Date: 08/26/2023 Prepared by: Reyes London  Exercises - Seated Lumbar Flexion Stretch  - 1 x daily - 3 sets - 30 hold - Seated Hamstring  Stretch  - 1 x daily - 3 sets - 30 hold  Access Code: [email or web address] URL: [email or web address] Date: 08/28/2023 Prepared by: Massie Dollar  Exercises - Supine Lower Trunk Rotation  - 1 x daily - 7 x weekly - 3 sets - 10 reps - Supine Bridge  - 1 x daily - 7 x weekly - 3 sets - 10 reps - Supine Piriformis Stretch with Foot on Ground  - 1 x daily - 7 x weekly - 3 sets - 10 reps - Supine Figure 4 Piriformis Stretch  - 1 x daily - 7 x weekly - 3 sets - 10 reps  ASSESSMENT:  CLINICAL IMPRESSION:   Patient continuing to respond well to interventions prescribed in today's session, similar to previous sessions. Continued to advance patient challenge with primarily higher level dynamic balance obstacles this date. Stride length for HIT looking great! Patient encouraged to continue to practice with home exercises and is self-motivated to do so.  Pt will continue to benefit from skilled physical therapy intervention to address impairments, improve QOL, and attain therapy goals.   OBJECTIVE IMPAIRMENTS: Abnormal gait, decreased activity tolerance, decreased balance, decreased coordination, decreased endurance, decreased mobility, difficulty walking, decreased ROM, decreased strength, hypomobility, postural dysfunction, and pain.   ACTIVITY LIMITATIONS: carrying, lifting, bending, sitting, standing, squatting, sleeping, stairs, transfers, and bed mobility  PARTICIPATION LIMITATIONS: meal prep, cleaning, laundry, shopping, community activity, and yard work  PERSONAL FACTORS: Age, Time since onset of injury/illness/exacerbation, and 3+ comorbidities: OA, DDD  Previous surgeries,  are also affecting patient's functional outcome.   REHAB POTENTIAL: Good  CLINICAL DECISION MAKING: Evolving/moderate complexity  EVALUATION COMPLEXITY: Moderate   GOALS: Goals reviewed with patient? Yes SHORT TERM GOALS: Target date: 10/07/2023  Pt will be independent with HEP in order to improve strength and  decrease back pain in order to improve pain-free function at home. Baseline: EVAL= No formal HEP in place Goal status: INITIAL  LONG TERM GOALS: Target date: 11/18/2023  Pt will decrease mODI score by at least 8 points in order demonstrate clinically  significant reduction in back pain/disability. Baseline: EVAL = 24 Goal status: INITIAL  2.  Pt will decrease worst back pain as reported on NPRS by at least 2 points in order to demonstrate clinically significant reduction in back pain.  Baseline: Worst low back pain=6/10 Goal status: INITIAL  3.  Pt will improve BERG by at least 3 points in order to demonstrate clinically significant improvement in balance.  Baseline: 47/56  Goal status: INITIAL  4.  Pt will improve ABC by at least 13% in order to demonstrate clinically significant improvement in balance confidence Baseline: EVAL= 58.75% Goal status: INITIAL  5.  Pt will decrease 5TSTS by at least 3 seconds in order to demonstrate clinically significant improvement in LE strength. Baseline: 14.07 sec without UE Goal status: INITIAL  6.  Pt will increase by at least 82m (132ft) in order to demonstrate clinically significant improvement in cardiopulmonary endurance and community ambulation  Baseline: 818ft with no AD Goal status: INITIAL  PLAN:  PT FREQUENCY: 1-2x/week  PT DURATION: 12 weeks  PLANNED INTERVENTIONS: 97164- PT Re-evaluation, 97750- Physical Performance Testing, 97110-Therapeutic exercises, 97530- Therapeutic activity, [id number]- Neuromuscular re-education, 97535- Self Care, 02859- Manual therapy, [id number]- Gait training, [phone]- Orthotic Initial, [id number]- Orthotic/Prosthetic subsequent, [phone]- Canalith repositioning, [id number]- Electrical stimulation (manual), [phone] (1-2 muscles), [phone] (3+ muscles)- Dry Needling, Patient/Family education, Balance training, Stair training, Taping, Joint mobilization, Joint manipulation, Spinal manipulation, Spinal mobilization, Vestibular  training, DME instructions, Cryotherapy, and Moist heat.  PLAN FOR NEXT SESSION:   -Low back, thoracic, LE stretching  - open book stretches  - thoracic extension -Gait: address decreased knee extension, heel strike, guarded position leading to decreased arm swing & trunk rotation  -assess footwear (Add any balance, LE/low back/core strengthening to HEP)   1:20 PM, 09/23/23 Peggye JAYSON Linear, PT, DPT Physical Therapist - [facility]  Outpatient Physical Therapy- Main Campus [phone]

## 2023-09-25 ENCOUNTER — Ambulatory Visit

## 2023-09-26 ENCOUNTER — Ambulatory Visit: Admitting: Physical Therapy

## 2023-09-26 DIAGNOSIS — R262 Difficulty in walking, not elsewhere classified: Secondary | ICD-10-CM

## 2023-09-26 DIAGNOSIS — R278 Other lack of coordination: Secondary | ICD-10-CM

## 2023-09-26 DIAGNOSIS — R2681 Unsteadiness on feet: Secondary | ICD-10-CM

## 2023-09-26 DIAGNOSIS — M5459 Other low back pain: Secondary | ICD-10-CM

## 2023-09-26 DIAGNOSIS — M6281 Muscle weakness (generalized): Secondary | ICD-10-CM

## 2023-09-26 DIAGNOSIS — R2689 Other abnormalities of gait and mobility: Secondary | ICD-10-CM

## 2023-09-26 DIAGNOSIS — R269 Unspecified abnormalities of gait and mobility: Secondary | ICD-10-CM | POA: Diagnosis not present

## 2023-09-26 NOTE — Therapy (Unsigned)
 OUTPATIENT PHYSICAL THERAPY TREATMENT  Patient Name: Carlos Neal. MRN: [id number] DOB:November 01, 1940, 83 y.o., male Today's Date: 09/26/2023  END OF SESSION:    PT End of Session - 09/26/23 1323     Visit Number 9    Number of Visits 24    Date for Recertification  11/18/23    Progress Note Due on Visit 10    PT Start Time 1321    PT Stop Time 1400    PT Time Calculation (min) 39 min    Equipment Utilized During Treatment Gait belt    Activity Tolerance Patient tolerated treatment well    Behavior During Therapy WFL for tasks assessed/performed          Past Medical History:  Diagnosis Date   Anemia    Asthma    as a child   GERD (gastroesophageal reflux disease)    Hypertension    Osteoarthritis    back and knee   Osteopenia    Osteoporosis    Vitamin D  deficiency    Past Surgical History:  Procedure Laterality Date   BROW LIFT Bilateral 09/23/2020   Procedure: BLEPHAROPTOSIS REPAIR; RESECT EX  BROW PTOSIS REPAIR ECTROPION REPAIR, EXTENSIVE BILATERAL;  Surgeon: Ashley Greig HERO, MD;  Location: [facility];  Service: Ophthalmology;  Laterality: Bilateral;  wants to be later in the schedule   COLONOSCOPY  01/12/2013   COLONOSCOPY N/A 01/05/2022   Procedure: COLONOSCOPY;  Surgeon: Toledo, Ladell POUR, MD;  Location: [facility];  Service: Gastroenterology;  Laterality: N/A;   ESOPHAGOGASTRODUODENOSCOPY (EGD) WITH PROPOFOL  N/A 01/05/2022   Procedure: ESOPHAGOGASTRODUODENOSCOPY (EGD) WITH PROPOFOL ;  Surgeon: Toledo, Ladell POUR, MD;  Location: [facility];  Service: Gastroenterology;  Laterality: N/A;   GIVENS CAPSULE STUDY N/A 01/05/2022   Procedure: GIVENS CAPSULE STUDY;  Surgeon: Toledo, Ladell POUR, MD;  Location: [facility];  Service: Gastroenterology;  Laterality: N/A;   HYDROCELE EXCISION     age 68   INGUINAL HERNIA REPAIR Bilateral    as a child   KNEE ARTHROPLASTY Right 01/30/2021   Procedure: COMPUTER ASSISTED TOTAL KNEE ARTHROPLASTY;  Surgeon: Mardee Lynwood SQUIBB, MD;  Location: [facility];  Service: Orthopedics;  Laterality: Right;   LUMBAR LAMINECTOMY/ DECOMPRESSION WITH MET-RX Right 01/15/2020   Procedure: L2-3 DECOMPRESSION, RIGHT L2-3 FAR LATERAL DISCECTOMY;  Surgeon: Clois Fret, MD;  Location: [facility];  Service: Neurosurgery;  Laterality: Right;  2nd case   TOTAL HIP ARTHROPLASTY Left 12/04/2021   Procedure: TOTAL HIP ARTHROPLASTY;  Surgeon: Mardee Lynwood SQUIBB, MD;  Location: [facility];  Service: Orthopedics;  Laterality: Left;   Patient Active Problem List   Diagnosis Date Noted   Senile purpura 09/05/2023   IDA (iron  deficiency anemia) 01/11/2022   AVM (arteriovenous malformation) 01/11/2022   Symptomatic anemia 01/03/2022   Positive D dimer 01/03/2022   H/O total hip arthroplasty, left 12/04/21 12/04/2021   Chronic left shoulder pain 11/09/2021   Primary osteoarthritis of left hip 08/22/2021   Status post total right knee replacement 01/30/2021   B12 deficiency 03/16/2020   BPH associated with nocturia 03/16/2020   Chronic pain syndrome 12/23/2019   Pharmacologic therapy 12/23/2019   Disorder of skeletal system 12/23/2019   Problems influencing health status 12/23/2019   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HCC) 12/23/2019   Chronic lower extremity pain (1ry area of Pain) (Right) 12/23/2019   Chronic low back pain (2ry area of Pain) (Bilateral) (R>L) w/ sciatica (Right) 12/23/2019   Abnormal MRI, lumbar spine (12/04/2019) 12/23/2019   Anterolisthesis of lumbar  spine (L4-5) 12/23/2019   Retrolisthesis (T12-L1, L1-2) 12/23/2019   Lumbosacral foraminal stenosis 12/23/2019   Lumbar facet arthropathy 12/23/2019   Osteoarthritis of facet joint of lumbar spine 12/23/2019   Lumbar facet syndrome (Bilateral) 12/23/2019   Wedge compression fracture of T12 vertebra, sequela 12/23/2019   DDD (degenerative disc disease), lumbosacral 12/23/2019   DDD (degenerative disc disease), cervical 12/23/2019   Cervical facet syndrome (Right)  12/23/2019   Cervicalgia 12/23/2019   Chronic neck pain (Right) 12/23/2019   Essential hypertension 12/20/2019   GERD without esophagitis 12/20/2019   Hereditary hemochromatosis 12/20/2019   Impotence 12/20/2019   Insomnia 12/20/2019   Osteopenia 12/20/2019   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 Alda Carpen  REFERRING PROVIDER: Dr. Alda Carpen  REFERRING DIAG:  [phone] (ICD-10-CM) - Spinal stenosis, lumbar region, without neurogenic claudication  R26.81 (ICD-10-CM) - Unsteadiness on feet    Rationale for Evaluation and Treatment: Rehabilitation  THERAPY DIAG:   Abnormality of gait and mobility  Muscle weakness (generalized)  Unsteadiness on feet  Other low back pain  Difficulty in walking, not elsewhere classified  Other abnormalities of gait and mobility  Other lack of coordination  ONSET DATE: 2022  SUBJECTIVE:                                                                                                                                                                                           SUBJECTIVE STATEMENT:    Pt reports that he got a really good work out last time he was seen by PT. Went to [place] the day after treatment and was excessively tired and soreness.    PERTINENT HISTORY:  Per most recent PCP Visit note on 08/20/2023: Spinal stenosis of lumbar region without neurogenic claudication. Slightly improved but still symptomatic. Patient with unstable gait and lack of strength. Plan to refer to physical therapy for evaluation   PAIN:  Are you having pain? No pain   PRECAUTIONS: Fall  WEIGHT BEARING RESTRICTIONS: No  FALLS:  Has patient fallen  in last 6 months? No  LIVING ENVIRONMENT: Lives with: lives with their spouse Lives in: House/apartment Stairs: Yes: External: 3 steps; can reach both Has following equipment at home: Single point cane, Walker - 2 wheeled, and Grab bars  OCCUPATION: Retired - 2019.  [employer] and worked at [employer] prior to that.   PLOF: Independent  PATIENT GOALS: Improve my walking  NEXT MD VISIT: Dr. Babara (hematologist)   OBJECTIVE:  Note: Objective measures were completed at evaluation unless otherwise noted.  FUNCTIONAL TESTS:  5 times sit to stand: 14.07 sec without UE Timed up and go (TUG): 14.34 sec Avg 6 minute walk test: 855ft  10 meter walk test: 0.74 m/s avg Berg Balance Scale: 47/56   TREATMENT DATE: 09/26/2023  -STS x15 with extension/posture stretch  -Sit<>stand x 4 throughout session with weighted 10# medicine ball   - Standing on airex beam:  Normal BOS 2 x 30 sec  Narrow BOS 2 x 30 sec  Sidestepping R and L with light UE support x 5 bil  Tandem stance with light to no UE support 2 x 12 sec bil   Weighted gait with 10# med ball  2 x 120ft  Forward/reverse 107ft x 4  Side stepping 52ft x 3   CGA for safety with min cues for posture and step length/foot clearance initially.     PATIENT EDUCATION:  Education details: exercise modification to avoid pain in shoulders.  Person educated: Patient Education method: Explanation, Demonstration, Tactile cues, Verbal cues, and Handouts Education comprehension: verbalized understanding, returned demonstration, verbal cues required, tactile cues required, and needs further education  HOME EXERCISE PROGRAM: Access Code: [email or web address] URL: [email or web address] Date: 08/26/2023 Prepared by: Reyes London  Exercises - Seated Lumbar Flexion Stretch  - 1 x daily - 3 sets - 30 hold - Seated Hamstring Stretch  - 1 x daily - 3 sets - 30 hold  Access Code: [email or web address] URL:  [email or web address] Date: 08/28/2023 Prepared by: Massie Dollar  Exercises - Supine Lower Trunk Rotation  - 1 x daily - 7 x weekly - 3 sets - 10 reps - Supine Bridge  - 1 x daily - 7 x weekly - 3 sets - 10 reps - Supine Piriformis Stretch with Foot on Ground  - 1 x daily - 7 x weekly - 3 sets - 10 reps - Supine Figure 4 Piriformis Stretch  - 1 x daily - 7 x weekly - 3 sets - 10 reps  ASSESSMENT:  CLINICAL IMPRESSION:   Patient continuing to respond well to interventions prescribed in today's session, similar to previous sessions. Continued to advance patient challenge with primarily higher level dynamic balance obstacles this date. Stride length for HIT looking great! Patient encouraged to continue to practice with home exercises and is self-motivated to do so.  Pt will continue to benefit from skilled physical therapy intervention to address impairments, improve QOL, and attain therapy goals.   OBJECTIVE IMPAIRMENTS: Abnormal gait, decreased activity tolerance, decreased balance, decreased coordination, decreased endurance, decreased mobility, difficulty walking, decreased ROM, decreased strength, hypomobility, postural dysfunction, and pain.   ACTIVITY LIMITATIONS: carrying, lifting, bending, sitting, standing, squatting, sleeping, stairs, transfers, and bed mobility  PARTICIPATION LIMITATIONS: meal prep, cleaning, laundry, shopping, community activity, and yard work  PERSONAL FACTORS: Age, Time since onset of injury/illness/exacerbation, and 3+ comorbidities: OA, DDD  Previous surgeries,  are also affecting patient's functional outcome.   REHAB POTENTIAL: Good  CLINICAL DECISION MAKING: Evolving/moderate complexity  EVALUATION COMPLEXITY: Moderate   GOALS: Goals reviewed with patient? Yes SHORT TERM GOALS: Target date: 10/07/2023  Pt will be independent with HEP in order to improve strength and decrease back pain in order to improve pain-free function at  home. Baseline: EVAL= No formal HEP in place Goal status: INITIAL  LONG TERM GOALS: Target date: 11/18/2023  Pt will decrease mODI score by at least  8 points in order demonstrate clinically significant reduction in back pain/disability. Baseline: EVAL = 24 Goal status: INITIAL  2.  Pt will decrease worst back pain as reported on NPRS by at least 2 points in order to demonstrate clinically significant reduction in back pain.  Baseline: Worst low back pain=6/10 Goal status: INITIAL  3.  Pt will improve BERG by at least 3 points in order to demonstrate clinically significant improvement in balance.  Baseline: 47/56  Goal status: INITIAL  4.  Pt will improve ABC by at least 13% in order to demonstrate clinically significant improvement in balance confidence Baseline: EVAL= 58.75% Goal status: INITIAL  5.  Pt will decrease 5TSTS by at least 3 seconds in order to demonstrate clinically significant improvement in LE strength. Baseline: 14.07 sec without UE Goal status: INITIAL  6.  Pt will increase by at least 6m (159ft) in order to demonstrate clinically significant improvement in cardiopulmonary endurance and community ambulation  Baseline: 88ft with no AD Goal status: INITIAL  PLAN:  PT FREQUENCY: 1-2x/week  PT DURATION: 12 weeks  PLANNED INTERVENTIONS: 97164- PT Re-evaluation, 97750- Physical Performance Testing, 97110-Therapeutic exercises, 97530- Therapeutic activity, [id number]- Neuromuscular re-education, 97535- Self Care, 02859- Manual therapy, [id number]- Gait training, [phone]- Orthotic Initial, [id number]- Orthotic/Prosthetic subsequent, [phone]- Canalith repositioning, [id number]- Electrical stimulation (manual), [phone] (1-2 muscles), 20561 (3+ muscles)- Dry Needling, Patient/Family education, Balance training, Stair training, Taping, Joint mobilization, Joint manipulation, Spinal manipulation, Spinal mobilization, Vestibular training, DME instructions, Cryotherapy, and Moist heat.  PLAN  FOR NEXT SESSION:   -Low back, thoracic, LE stretching  - open book stretches  - thoracic extension -Gait: address decreased knee extension, heel strike, guarded position leading to decreased arm swing & trunk rotation  -assess footwear (Add any balance, LE/low back/core strengthening to HEP)   1:23 PM, 09/26/23 Peggye JAYSON Linear, PT, DPT Physical Therapist -  [facility]  Outpatient Physical Therapy- [facility] [phone]

## 2023-09-30 ENCOUNTER — Ambulatory Visit

## 2023-09-30 DIAGNOSIS — R278 Other lack of coordination: Secondary | ICD-10-CM

## 2023-09-30 DIAGNOSIS — R269 Unspecified abnormalities of gait and mobility: Secondary | ICD-10-CM

## 2023-09-30 DIAGNOSIS — M6281 Muscle weakness (generalized): Secondary | ICD-10-CM

## 2023-09-30 DIAGNOSIS — R2689 Other abnormalities of gait and mobility: Secondary | ICD-10-CM

## 2023-09-30 DIAGNOSIS — R2681 Unsteadiness on feet: Secondary | ICD-10-CM

## 2023-09-30 DIAGNOSIS — M5459 Other low back pain: Secondary | ICD-10-CM

## 2023-09-30 DIAGNOSIS — R262 Difficulty in walking, not elsewhere classified: Secondary | ICD-10-CM

## 2023-09-30 NOTE — Therapy (Signed)
 OUTPATIENT PHYSICAL THERAPY TREATMENT Physical Therapy Progress Note   Dates of reporting period   08/26/23  to   09/30/23   Patient Name: Carlos Neal. MRN: [id number] DOB:11-20-1940, 83 y.o., male Today's Date: 09/30/2023  END OF SESSION:    PT End of Session - 09/30/23 1147     Visit Number 10    Number of Visits 24    Date for Recertification  11/18/23    Authorization Type [employer]    Progress Note Due on Visit 10    PT Start Time 1145    PT Stop Time 1225    PT Time Calculation (min) 40 min    Equipment Utilized During Treatment Gait belt    Activity Tolerance Patient tolerated treatment well    Behavior During Therapy WFL for tasks assessed/performed          Past Medical History:  Diagnosis Date   Anemia    Asthma    as a child   GERD (gastroesophageal reflux disease)    Hypertension    Osteoarthritis    back and knee   Osteopenia    Osteoporosis    Vitamin D  deficiency    Past Surgical History:  Procedure Laterality Date   BROW LIFT Bilateral 09/23/2020   Procedure: BLEPHAROPTOSIS REPAIR; RESECT EX  BROW PTOSIS REPAIR ECTROPION REPAIR, EXTENSIVE BILATERAL;  Surgeon: Ashley Greig HERO, MD;  Location: [facility];  Service: Ophthalmology;  Laterality: Bilateral;  wants to be later in the schedule   COLONOSCOPY  01/12/2013   COLONOSCOPY N/A 01/05/2022   Procedure: COLONOSCOPY;  Surgeon: Toledo, Ladell POUR, MD;  Location: [facility];  Service: Gastroenterology;  Laterality: N/A;   ESOPHAGOGASTRODUODENOSCOPY (EGD) WITH PROPOFOL  N/A 01/05/2022   Procedure: ESOPHAGOGASTRODUODENOSCOPY (EGD) WITH PROPOFOL ;  Surgeon: Toledo, Ladell POUR, MD;  Location: [facility];  Service: Gastroenterology;  Laterality: N/A;   GIVENS CAPSULE STUDY N/A 01/05/2022   Procedure: GIVENS CAPSULE STUDY;  Surgeon: Toledo, Ladell POUR, MD;  Location: [facility];  Service: Gastroenterology;  Laterality: N/A;   HYDROCELE EXCISION     age 30   INGUINAL HERNIA REPAIR  Bilateral    as a child   KNEE ARTHROPLASTY Right 01/30/2021   Procedure: COMPUTER ASSISTED TOTAL KNEE ARTHROPLASTY;  Surgeon: Mardee Lynwood SQUIBB, MD;  Location: [facility];  Service: Orthopedics;  Laterality: Right;   LUMBAR LAMINECTOMY/ DECOMPRESSION WITH MET-RX Right 01/15/2020   Procedure: L2-3 DECOMPRESSION, RIGHT L2-3 FAR LATERAL DISCECTOMY;  Surgeon: Clois Fret, MD;  Location: [facility];  Service: Neurosurgery;  Laterality: Right;  2nd case   TOTAL HIP ARTHROPLASTY Left 12/04/2021   Procedure: TOTAL HIP ARTHROPLASTY;  Surgeon: Mardee Lynwood SQUIBB, MD;  Location: [facility];  Service: Orthopedics;  Laterality: Left;   Patient Active Problem List   Diagnosis Date Noted   Senile purpura 09/05/2023   IDA (iron  deficiency anemia) 01/11/2022   AVM (arteriovenous malformation) 01/11/2022   Symptomatic anemia 01/03/2022   Positive D dimer 01/03/2022   H/O total hip arthroplasty, left 12/04/21 12/04/2021   Chronic left shoulder pain 11/09/2021   Primary osteoarthritis of left hip 08/22/2021   Status post total right knee replacement 01/30/2021   B12 deficiency 03/16/2020   BPH associated with nocturia 03/16/2020   Chronic pain syndrome 12/23/2019   Pharmacologic therapy 12/23/2019   Disorder of skeletal system 12/23/2019   Problems influencing health status 12/23/2019   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HCC) 12/23/2019   Chronic lower extremity pain (1ry area of Pain) (Right) 12/23/2019   Chronic  low back pain (2ry area of Pain) (Bilateral) (R>L) w/ sciatica (Right) 12/23/2019   Abnormal MRI, lumbar spine (12/04/2019) 12/23/2019   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) 12/23/2019   Retrolisthesis (T12-L1, L1-2) 12/23/2019   Lumbosacral foraminal stenosis 12/23/2019   Lumbar facet arthropathy 12/23/2019   Osteoarthritis of facet joint of lumbar spine 12/23/2019   Lumbar facet syndrome (Bilateral) 12/23/2019   Wedge compression fracture of T12 vertebra, sequela 12/23/2019   DDD (degenerative  disc disease), lumbosacral 12/23/2019   DDD (degenerative disc disease), cervical 12/23/2019   Cervical facet syndrome (Right) 12/23/2019   Cervicalgia 12/23/2019   Chronic neck pain (Right) 12/23/2019   Essential hypertension 12/20/2019   GERD without esophagitis 12/20/2019   Hereditary hemochromatosis 12/20/2019   Impotence 12/20/2019   Insomnia 12/20/2019   Osteopenia 12/20/2019   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 Alda Carpen  REFERRING PROVIDER: Dr. Alda Carpen  REFERRING DIAG:  [phone] (ICD-10-CM) - Spinal stenosis, lumbar region, without neurogenic claudication  R26.81 (ICD-10-CM) - Unsteadiness on feet    Rationale for Evaluation and Treatment: Rehabilitation  THERAPY DIAG:   Abnormality of gait and mobility  Muscle weakness (generalized)  Unsteadiness on feet  Other low back pain  Difficulty in walking, not elsewhere classified  Other abnormalities of gait and mobility  Other lack of coordination  ONSET DATE: 2022  SUBJECTIVE:                                                                                                                                                                                           SUBJECTIVE STATEMENT:    Pt feels he is making progress so far. He sees his gait as less rigid overall.   PERTINENT HISTORY:  Per most recent PCP Visit note on 08/20/2023: Spinal stenosis of lumbar region without neurogenic claudication. Slightly improved but still symptomatic. Patient with unstable gait and lack of strength. Plan to refer to physical therapy for evaluation   PAIN:  Are you having pain?    PRECAUTIONS: Fall  WEIGHT BEARING  RESTRICTIONS: No  FALLS:  Has patient fallen in last 6 months? No  LIVING ENVIRONMENT: Lives with: lives with their spouse Lives in: House/apartment Stairs: Yes: External: 3 steps; can reach both Has following equipment at home: Single point cane, Walker - 2 wheeled, and Grab bars  OCCUPATION: Retired - 2019.  [employer] and worked at  [employer] prior to that.   PLOF: Independent  PATIENT GOALS: Improve my walking  OBJECTIVE:  Note: Objective measures were completed at evaluation unless otherwise noted.  FUNCTIONAL TESTS:   EVAL 5 times sit to stand: 14.07 sec without UE Timed up and go (TUG): 14.34 sec Avg 6 minute walk test: 814ft  10 meter walk test: 0.74 m/s avg Berg Balance Scale: 47/56  09/30/23:  6 minute walk test: 1056ft, no device  5xSTS: 11.47sec hands free; 8.15sec hands free  TUG: 10.23sec  : 0.35m/s (self selected pace)  Berg Balance Test: 52/56   TREATMENT DATE: 09/30/2023 6 minute walk test: 1064ft, no device  5xSTS: 11.47sec hands free; 8.15sec hands free  TUG: 10.23sec  : 0.78m/s (self selected pace)  Berg Balance Test: 52/56 -alternate step taps 6 step holding sternal 5kg ball: 16 forward, 12 Rt (alternating) 12 Lt alternating -AMB over airex square, 2 step, airex square, u-turn on red mat, then aforementioned in reverse -4 square stepping with YTB at knees    PATIENT EDUCATION:  Education details: exercise modification to avoid pain in shoulders.  Person educated: Patient Education method: Explanation, Demonstration, Tactile cues, Verbal cues, and Handouts Education comprehension: verbalized understanding, returned demonstration, verbal cues required, tactile cues required, and needs further education  HOME EXERCISE PROGRAM: Access Code: [email or web address] URL: [email or web address] Date: 08/26/2023 Prepared by: Reyes London  Exercises - Seated Lumbar Flexion Stretch  - 1 x daily - 3 sets - 30 hold -  Seated Hamstring Stretch  - 1 x daily - 3 sets - 30 hold  Access Code: [email or web address] URL: [email or web address] Date: 08/28/2023 Prepared by: Massie Dollar  Exercises - Supine Lower Trunk Rotation  - 1 x daily - 7 x weekly - 3 sets - 10 reps - Supine Bridge  - 1 x daily - 7 x weekly - 3 sets - 10 reps - Supine Piriformis Stretch with Foot on Ground  - 1 x daily - 7 x weekly - 3 sets - 10 reps - Supine Figure 4 Piriformis Stretch  - 1 x daily - 7 x weekly - 3 sets - 10 reps  ASSESSMENT:  CLINICAL IMPRESSION:   Pt making progress on all outcome measures thus far, all statistically far beyond what is typical after 10 visits. Pt shows no improvement with SLS time or tandem balance performance. Jumped right back into that balance training after standardized measures are finalized. Pt will continue to benefit from skilled physical therapy intervention to address impairments, improve QOL, and attain therapy goals.   OBJECTIVE IMPAIRMENTS: Abnormal gait, decreased activity tolerance, decreased balance, decreased coordination, decreased endurance, decreased mobility, difficulty walking, decreased ROM, decreased strength, hypomobility, postural dysfunction, and pain.   ACTIVITY LIMITATIONS: carrying, lifting, bending, sitting, standing, squatting, sleeping, stairs, transfers, and bed mobility  PARTICIPATION LIMITATIONS: meal prep, cleaning, laundry, shopping, community activity, and yard work  PERSONAL FACTORS: Age, Time since onset of injury/illness/exacerbation, and 3+ comorbidities: OA, DDD  Previous surgeries,  are also affecting patient's functional outcome.   REHAB POTENTIAL: Good  CLINICAL DECISION MAKING: Evolving/moderate complexity  EVALUATION COMPLEXITY: Moderate   GOALS: Goals reviewed with patient? Yes SHORT TERM GOALS: Target date: 10/07/2023  Pt will be independent with HEP in order to improve strength and decrease back pain in order to improve pain-free function at  home. Baseline:  Goal status: ACHIEVED   LONG TERM GOALS: Target date: 11/18/2023  Pt will decrease mODI score by at least 8 points in order demonstrate clinically significant reduction in back pain/disability.  Baseline: EVAL = 24 Goal status: INITIAL  2.  Pt will decrease worst back pain as reported on NPRS by at least 2 points in order to demonstrate clinically significant reduction in back pain.  Baseline: Worst low back pain: 6/10;  Goal status: INITIAL  3.  Pt will improve BERG by at least 3 points in order to demonstrate clinically significant improvement in balance.  Baseline: 47/56; 09/30/23: 52/56 Goal status: ACHIEVED  4.  Pt will improve ABC by at least 13% in order to demonstrate clinically significant improvement in balance confidence Baseline: EVAL= 58.75% Goal status: INITIAL  5.  Pt will decrease 5TSTS by at least 3 seconds in order to demonstrate clinically significant improvement in LE strength. Baseline: 14.07 sec without UE; 09/30/23: 8sec Goal status: ACHIEVED  6.  Pt will increase by at least 20m (151ft) in order to demonstrate clinically significant improvement in cardiopulmonary endurance and community ambulation  Baseline: 872ft with no AD; 1056ft no device, 3 stumbles  Goal status: ACHIEVED   PLAN:  PT FREQUENCY: 1-2x/week  PT DURATION: 12 weeks  PLANNED INTERVENTIONS: [id number]- PT Re-evaluation, 97750- Physical Performance Testing, 97110-Therapeutic exercises, 97530- Therapeutic activity, [id number]- Neuromuscular re-education, 97535- Self Care, [id number]- Manual therapy, [id number]- Gait training, [id number]- Orthotic Initial, [id number]- Orthotic/Prosthetic subsequent, [phone]- Canalith repositioning, [id number]- Electrical stimulation (manual), [phone] (1-2 muscles), [id number] (3+ muscles)- Dry Needling, Patient/Family education, Balance training, Stair training, Taping, Joint mobilization, Joint manipulation, Spinal manipulation, Spinal mobilization, Vestibular training, DME  instructions, Cryotherapy, and Moist heat.  PLAN FOR NEXT SESSION:   -Low back, thoracic, LE stretching  - open book stretches  - thoracic extension -Gait: address decreased knee extension, heel strike, guarded position leading to decreased arm swing & trunk rotation  -assess footwear    11:57 AM, 09/30/23 Peggye JAYSON Linear, PT, DPT Physical Therapist - [facility]  Outpatient Physical Therapy- Main Campus [phone]

## 2023-10-02 ENCOUNTER — Ambulatory Visit: Attending: Family Medicine | Admitting: Physical Therapy

## 2023-10-02 DIAGNOSIS — R269 Unspecified abnormalities of gait and mobility: Secondary | ICD-10-CM | POA: Insufficient documentation

## 2023-10-02 DIAGNOSIS — R278 Other lack of coordination: Secondary | ICD-10-CM | POA: Insufficient documentation

## 2023-10-02 DIAGNOSIS — R262 Difficulty in walking, not elsewhere classified: Secondary | ICD-10-CM | POA: Insufficient documentation

## 2023-10-02 DIAGNOSIS — M6281 Muscle weakness (generalized): Secondary | ICD-10-CM | POA: Insufficient documentation

## 2023-10-02 DIAGNOSIS — R2689 Other abnormalities of gait and mobility: Secondary | ICD-10-CM | POA: Insufficient documentation

## 2023-10-02 DIAGNOSIS — R2681 Unsteadiness on feet: Secondary | ICD-10-CM | POA: Insufficient documentation

## 2023-10-02 DIAGNOSIS — M5459 Other low back pain: Secondary | ICD-10-CM | POA: Diagnosis present

## 2023-10-02 NOTE — Therapy (Signed)
 OUTPATIENT PHYSICAL THERAPY TREATMENT  Patient Name: Carlos Neal. MRN: [id number] DOB:[birth year], 83 y.o., male Today's Date: 10/02/2023  END OF SESSION:    PT End of Session - 10/02/23 0849     Visit Number 11    Number of Visits 24    Date for Recertification  11/18/23    Authorization Type [employer]    Progress Note Due on Visit 10    PT Start Time 0850    PT Stop Time 0930    PT Time Calculation (min) 40 min    Equipment Utilized During Treatment Gait belt    Activity Tolerance Patient tolerated treatment well    Behavior During Therapy WFL for tasks assessed/performed          Past Medical History:  Diagnosis Date   Anemia    Asthma    as a child   GERD (gastroesophageal reflux disease)    Hypertension    Osteoarthritis    back and knee   Osteopenia    Osteoporosis    Vitamin D  deficiency    Past Surgical History:  Procedure Laterality Date   BROW LIFT Bilateral 09/23/2020   Procedure: BLEPHAROPTOSIS REPAIR; RESECT EX  BROW PTOSIS REPAIR ECTROPION REPAIR, EXTENSIVE BILATERAL;  Surgeon: Ashley Greig HERO, MD;  Location: [facility];  Service: Ophthalmology;  Laterality: Bilateral;  wants to be later in the schedule   COLONOSCOPY  01/12/2013   COLONOSCOPY N/A 01/05/2022   Procedure: COLONOSCOPY;  Surgeon: Toledo, Ladell POUR, MD;  Location: [facility];  Service: Gastroenterology;  Laterality: N/A;   ESOPHAGOGASTRODUODENOSCOPY (EGD) WITH PROPOFOL  N/A 01/05/2022   Procedure: ESOPHAGOGASTRODUODENOSCOPY (EGD) WITH PROPOFOL ;  Surgeon: Toledo, Ladell POUR, MD;  Location: [facility];  Service: Gastroenterology;  Laterality: N/A;   GIVENS CAPSULE STUDY N/A 01/05/2022   Procedure: GIVENS CAPSULE STUDY;  Surgeon: Toledo, Ladell POUR, MD;  Location: [facility];  Service: Gastroenterology;  Laterality: N/A;   HYDROCELE EXCISION     age 59   INGUINAL HERNIA REPAIR Bilateral    as a child   KNEE ARTHROPLASTY Right 01/30/2021   Procedure: COMPUTER ASSISTED  TOTAL KNEE ARTHROPLASTY;  Surgeon: Mardee Lynwood SQUIBB, MD;  Location: [facility];  Service: Orthopedics;  Laterality: Right;   LUMBAR LAMINECTOMY/ DECOMPRESSION WITH MET-RX Right 01/15/2020   Procedure: L2-3 DECOMPRESSION, RIGHT L2-3 FAR LATERAL DISCECTOMY;  Surgeon: Clois Fret, MD;  Location: [facility];  Service: Neurosurgery;  Laterality: Right;  2nd case   TOTAL HIP ARTHROPLASTY Left 12/04/2021   Procedure: TOTAL HIP ARTHROPLASTY;  Surgeon: Mardee Lynwood SQUIBB, MD;  Location: [facility];  Service: Orthopedics;  Laterality: Left;   Patient Active Problem List   Diagnosis Date Noted   Senile purpura 09/05/2023   IDA (iron  deficiency anemia) 01/11/2022   AVM (arteriovenous malformation) 01/11/2022   Symptomatic anemia 01/03/2022   Positive D dimer 01/03/2022   H/O total hip arthroplasty, left 12/04/21 12/04/2021   Chronic left shoulder pain 11/09/2021   Primary osteoarthritis of left hip 08/22/2021   Status post total right knee replacement 01/30/2021   B12 deficiency 03/16/2020   BPH associated with nocturia 03/16/2020   Chronic pain syndrome 12/23/2019   Pharmacologic therapy 12/23/2019   Disorder of skeletal system 12/23/2019   Problems influencing health status 12/23/2019   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HCC) 12/23/2019   Chronic lower extremity pain (1ry area of Pain) (Right) 12/23/2019   Chronic low back pain (2ry area of Pain) (Bilateral) (R>L) w/ sciatica (Right) 12/23/2019   Abnormal MRI, lumbar spine (  12/04/2019) 12/23/2019   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) 12/23/2019   Retrolisthesis (T12-L1, L1-2) 12/23/2019   Lumbosacral foraminal stenosis 12/23/2019   Lumbar facet arthropathy 12/23/2019   Osteoarthritis of facet joint of lumbar spine 12/23/2019   Lumbar facet syndrome (Bilateral) 12/23/2019   Wedge compression fracture of T12 vertebra, sequela 12/23/2019   DDD (degenerative disc disease), lumbosacral 12/23/2019   DDD (degenerative disc disease), cervical 12/23/2019    Cervical facet syndrome (Right) 12/23/2019   Cervicalgia 12/23/2019   Chronic neck pain (Right) 12/23/2019   Essential hypertension 12/20/2019   GERD without esophagitis 12/20/2019   Hereditary hemochromatosis 12/20/2019   Impotence 12/20/2019   Insomnia 12/20/2019   Osteopenia 12/20/2019   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 Alda Carpen  REFERRING PROVIDER: Dr. Alda Carpen  REFERRING DIAG:  [phone] (ICD-10-CM) - Spinal stenosis, lumbar region, without neurogenic claudication  R26.81 (ICD-10-CM) - Unsteadiness on feet    Rationale for Evaluation and Treatment: Rehabilitation  THERAPY DIAG:   Abnormality of gait and mobility  Muscle weakness (generalized)  Unsteadiness on feet  Other low back pain  Difficulty in walking, not elsewhere classified  Other abnormalities of gait and mobility  Other lack of coordination  ONSET DATE: 2022  SUBJECTIVE:                                                                                                                                                                                           SUBJECTIVE STATEMENT:    Pt reports that he is doing well this AM. Has a little stiffness in the back this AM, and a slight amount of pain in lower region, but does not rate. Also continues to report that his balance is off and needs Work.   PERTINENT HISTORY:  Per most recent PCP Visit note on 08/20/2023: Spinal stenosis of lumbar region without neurogenic claudication. Slightly improved but still symptomatic. Patient with unstable gait and lack of strength. Plan to refer to physical therapy for evaluation   PAIN:  Are you having pain? No  pain   PRECAUTIONS: Fall  WEIGHT BEARING RESTRICTIONS: No  FALLS:  Has patient fallen in last 6 months? 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   PLOF: Independent  PATIENT GOALS: Improve my walking  NEXT MD VISIT: Dr. Babara (hematologist)   OBJECTIVE:  Note: Objective measures were completed at evaluation unless otherwise noted.  FUNCTIONAL TESTS:  5 times sit to stand: 14.07 sec without UE Timed up and go (TUG): 14.34 sec Avg 6 minute walk test: 835ft  10 meter walk test: 0.74 m/s avg Berg Balance Scale: 47/56   TREATMENT DATE: 10/02/2023  Nustep BLE/BUE reciprocal movement training with encouragement to allow increased trunkal rotation x 6 min rolling hill program level 2-7.   Sit>stand with pull assist from bar to engage lats 2 x 15  Trunk rotation to move letters across board  with cross body reach. X 15 bil   Forward lunge with 1 Ue support on rail x 10 bil  Lateral lunge with 1 UE support x 10  bil   Standing on airex pad:  Semitandem 15 sec hold bil  Semitandem with cross body reach to touch target x 10 bil Eyes open/eyes closed x 10 bil   Pavlov press x 10 bil with GTB.   CGA for safety with min cues for posture and step length/foot clearance with lunges initially, and able to maintain without  addition instruction    PATIENT EDUCATION:  Education details: exercise modification to avoid pain in shoulders.  Person educated: Patient Education method: Explanation, Demonstration, Tactile cues, Verbal cues, and Handouts Education comprehension: verbalized understanding, returned demonstration, verbal cues required, tactile cues required, and needs further education  HOME EXERCISE  PROGRAM: Access Code: [email or web address] URL: [email or web address] Date: 08/26/2023 Prepared by: Reyes London  Exercises - Seated Lumbar Flexion Stretch  - 1 x daily - 3 sets - 30 hold - Seated Hamstring Stretch  - 1 x daily - 3 sets - 30 hold  Access Code: [email or web address] URL: [email or web address] Date: 08/28/2023 Prepared by: Massie Dollar  Exercises - Supine Lower Trunk Rotation  - 1 x daily - 7 x weekly - 3 sets - 10 reps - Supine Bridge  - 1 x daily - 7 x weekly - 3 sets - 10 reps - Supine Piriformis Stretch with Foot on Ground  - 1 x daily - 7 x weekly - 3 sets - 10 reps - Supine Figure 4 Piriformis Stretch  - 1 x daily - 7 x weekly - 3 sets - 10 reps  ASSESSMENT:  CLINICAL IMPRESSION:   Patient continuing to respond well to interventions prescribed in today's session, similar to previous sessions. Added trunkal rotation and resistance interventions on this day to improve core and postural strengthening. Tolerated well with no increased pain reported by pt. Pt will continue to benefit from skilled physical therapy intervention to address impairments, improve QOL, and attain therapy goals.   OBJECTIVE IMPAIRMENTS: Abnormal gait, decreased activity tolerance, decreased balance, decreased coordination, decreased endurance, decreased mobility, difficulty walking, decreased ROM, decreased strength, hypomobility, postural dysfunction, and pain.   ACTIVITY LIMITATIONS: carrying, lifting, bending, sitting, standing, squatting, sleeping, stairs, transfers, and bed mobility  PARTICIPATION LIMITATIONS: meal prep, cleaning, laundry, shopping, community activity, and yard work  PERSONAL FACTORS: Age, Time since onset of injury/illness/exacerbation, and 3+ comorbidities: OA, DDD  Previous surgeries,  are also affecting patient's functional outcome.   REHAB POTENTIAL: Good  CLINICAL DECISION MAKING: Evolving/moderate complexity  EVALUATION COMPLEXITY:  Moderate   GOALS: Goals reviewed with patient? Yes SHORT TERM GOALS: Target date: 10/07/2023  Pt will be independent with HEP in  order to improve strength and decrease back pain in order to improve pain-free function at home. Baseline: EVAL= No formal HEP in place Goal status: INITIAL  LONG TERM GOALS: Target date: 11/18/2023  Pt will decrease mODI score by at least 8 points in order demonstrate clinically significant reduction in back pain/disability. Baseline: EVAL = 24 Goal status: INITIAL  2.  Pt will decrease worst back pain as reported on NPRS by at least 2 points in order to demonstrate clinically significant reduction in back pain.  Baseline: Worst low back pain=6/10 Goal status: INITIAL  3.  Pt will improve BERG by at least 3 points in order to demonstrate clinically significant improvement in balance.  Baseline: 47/56  Goal status: INITIAL  4.  Pt will improve ABC by at least 13% in order to demonstrate clinically significant improvement in balance confidence Baseline: EVAL= 58.75% Goal status: INITIAL  5.  Pt will decrease 5TSTS by at least 3 seconds in order to demonstrate clinically significant improvement in LE strength. Baseline: 14.07 sec without UE Goal status: INITIAL  6.  Pt will increase by at least 69m (136ft) in order to demonstrate clinically significant improvement in cardiopulmonary endurance and community ambulation  Baseline: 837ft with no AD Goal status: INITIAL  PLAN:  PT FREQUENCY: 1-2x/week  PT DURATION: 12 weeks  PLANNED INTERVENTIONS: [id number]- PT Re-evaluation, 97750- Physical Performance Testing, 97110-Therapeutic exercises, 97530- Therapeutic activity, [id number]- Neuromuscular re-education, 97535- Self Care, [id number]- Manual therapy, [id number]- Gait training, [phone]- Orthotic Initial, [id number]- Orthotic/Prosthetic subsequent, [phone]- Canalith repositioning, [id number]- Electrical stimulation (manual), [phone] (1-2 muscles), [id number] (3+ muscles)- Dry Needling,  Patient/Family education, Balance training, Stair training, Taping, Joint mobilization, Joint manipulation, Spinal manipulation, Spinal mobilization, Vestibular training, DME instructions, Cryotherapy, and Moist heat.  PLAN FOR NEXT SESSION:   -continue strengthening  in Low back, thoracic, LE stretching  - open book stretches  - thoracic extension -Gait: address decreased knee extension, heel strike, guarded position leading to decreased arm swing & trunk rotation  -assess footwear  any balance, LE/low back/core strengthening to HEP)   Massie Dollar PT, DPT  Physical Therapist - [place]  [facility]  9:55 AM 10/02/23

## 2023-10-07 ENCOUNTER — Ambulatory Visit: Admitting: Physical Therapy

## 2023-10-07 DIAGNOSIS — M6281 Muscle weakness (generalized): Secondary | ICD-10-CM

## 2023-10-07 DIAGNOSIS — R2689 Other abnormalities of gait and mobility: Secondary | ICD-10-CM

## 2023-10-07 DIAGNOSIS — R269 Unspecified abnormalities of gait and mobility: Secondary | ICD-10-CM

## 2023-10-07 DIAGNOSIS — R262 Difficulty in walking, not elsewhere classified: Secondary | ICD-10-CM

## 2023-10-07 DIAGNOSIS — R2681 Unsteadiness on feet: Secondary | ICD-10-CM

## 2023-10-07 NOTE — Therapy (Signed)
 OUTPATIENT PHYSICAL THERAPY TREATMENT  Patient Name: Carlos Neal. MRN: [id number] DOB:1940/12/28, 83 y.o., male Today's Date: 10/07/2023  END OF SESSION:    PT End of Session - 10/07/23 1321     Visit Number 12    Number of Visits 24    Date for Recertification  11/18/23    Authorization Type [employer]    Progress Note Due on Visit 20    PT Start Time 1319    PT Stop Time 1358    PT Time Calculation (min) 39 min    Equipment Utilized During Treatment Gait belt    Activity Tolerance Patient tolerated treatment well    Behavior During Therapy WFL for tasks assessed/performed          Past Medical History:  Diagnosis Date   Anemia    Asthma    as a child   GERD (gastroesophageal reflux disease)    Hypertension    Osteoarthritis    back and knee   Osteopenia    Osteoporosis    Vitamin D  deficiency    Past Surgical History:  Procedure Laterality Date   BROW LIFT Bilateral 09/23/2020   Procedure: BLEPHAROPTOSIS REPAIR; RESECT EX  BROW PTOSIS REPAIR ECTROPION REPAIR, EXTENSIVE BILATERAL;  Surgeon: Ashley Greig HERO, MD;  Location: [facility];  Service: Ophthalmology;  Laterality: Bilateral;  wants to be later in the schedule   COLONOSCOPY  01/12/2013   COLONOSCOPY N/A 01/05/2022   Procedure: COLONOSCOPY;  Surgeon: Toledo, Ladell POUR, MD;  Location: [facility];  Service: Gastroenterology;  Laterality: N/A;   ESOPHAGOGASTRODUODENOSCOPY (EGD) WITH PROPOFOL  N/A 01/05/2022   Procedure: ESOPHAGOGASTRODUODENOSCOPY (EGD) WITH PROPOFOL ;  Surgeon: Toledo, Ladell POUR, MD;  Location: [facility];  Service: Gastroenterology;  Laterality: N/A;   GIVENS CAPSULE STUDY N/A 01/05/2022   Procedure: GIVENS CAPSULE STUDY;  Surgeon: Toledo, Ladell POUR, MD;  Location: [facility];  Service: Gastroenterology;  Laterality: N/A;   HYDROCELE EXCISION     age 27   INGUINAL HERNIA REPAIR Bilateral    as a child   KNEE ARTHROPLASTY Right 01/30/2021   Procedure: COMPUTER ASSISTED  TOTAL KNEE ARTHROPLASTY;  Surgeon: Mardee Lynwood SQUIBB, MD;  Location: [facility];  Service: Orthopedics;  Laterality: Right;   LUMBAR LAMINECTOMY/ DECOMPRESSION WITH MET-RX Right 01/15/2020   Procedure: L2-3 DECOMPRESSION, RIGHT L2-3 FAR LATERAL DISCECTOMY;  Surgeon: Clois Fret, MD;  Location: [facility];  Service: Neurosurgery;  Laterality: Right;  2nd case   TOTAL HIP ARTHROPLASTY Left 12/04/2021   Procedure: TOTAL HIP ARTHROPLASTY;  Surgeon: Mardee Lynwood SQUIBB, MD;  Location: [facility];  Service: Orthopedics;  Laterality: Left;   Patient Active Problem List   Diagnosis Date Noted   Senile purpura 09/05/2023   IDA (iron  deficiency anemia) 01/11/2022   AVM (arteriovenous malformation) 01/11/2022   Symptomatic anemia 01/03/2022   Positive D dimer 01/03/2022   H/O total hip arthroplasty, left 12/04/21 12/04/2021   Chronic left shoulder pain 11/09/2021   Primary osteoarthritis of left hip 08/22/2021   Status post total right knee replacement 01/30/2021   B12 deficiency 03/16/2020   BPH associated with nocturia 03/16/2020   Chronic pain syndrome 12/23/2019   Pharmacologic therapy 12/23/2019   Disorder of skeletal system 12/23/2019   Problems influencing health status 12/23/2019   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HCC) 12/23/2019   Chronic lower extremity pain (1ry area of Pain) (Right) 12/23/2019   Chronic low back pain (2ry area of Pain) (Bilateral) (R>L) w/ sciatica (Right) 12/23/2019   Abnormal MRI, lumbar spine (  12/04/2019) 12/23/2019   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) 12/23/2019   Retrolisthesis (T12-L1, L1-2) 12/23/2019   Lumbosacral foraminal stenosis 12/23/2019   Lumbar facet arthropathy 12/23/2019   Osteoarthritis of facet joint of lumbar spine 12/23/2019   Lumbar facet syndrome (Bilateral) 12/23/2019   Wedge compression fracture of T12 vertebra, sequela 12/23/2019   DDD (degenerative disc disease), lumbosacral 12/23/2019   DDD (degenerative disc disease), cervical 12/23/2019    Cervical facet syndrome (Right) 12/23/2019   Cervicalgia 12/23/2019   Chronic neck pain (Right) 12/23/2019   Essential hypertension 12/20/2019   GERD without esophagitis 12/20/2019   Hereditary hemochromatosis 12/20/2019   Impotence 12/20/2019   Insomnia 12/20/2019   Osteopenia 12/20/2019   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 Alda Carpen  REFERRING PROVIDER: Dr. Alda Carpen  REFERRING DIAG:  [phone] (ICD-10-CM) - Spinal stenosis, lumbar region, without neurogenic claudication  R26.81 (ICD-10-CM) - Unsteadiness on feet    Rationale for Evaluation and Treatment: Rehabilitation  THERAPY DIAG:   No diagnosis found.  ONSET DATE: 2022  SUBJECTIVE:                                                                                                                                                                                           SUBJECTIVE STATEMENT:     Pt reports that he is doing well this AM. No changes to report, does report fatigue following session but it does not linger more than that day.   PERTINENT HISTORY:  Per most recent PCP Visit note on 08/20/2023: Spinal stenosis of lumbar region without neurogenic claudication. Slightly improved but still symptomatic. Patient with unstable gait and lack of strength. Plan to refer to physical therapy for evaluation   PAIN:  Are you having pain? No pain   PRECAUTIONS: Fall  WEIGHT BEARING RESTRICTIONS: No  FALLS:  Has patient fallen in last 6 months? No  LIVING ENVIRONMENT: Lives with: lives with their spouse Lives in: House/apartment Stairs: Yes: External: 3 steps; can reach both Has following equipment at home:  Single point cane, Walker - 2 wheeled, and Grab bars  OCCUPATION: Retired - 2019.  [employer] and worked at [employer] prior to that.   PLOF: Independent  PATIENT GOALS: Improve my walking  NEXT MD VISIT: Dr. Babara (hematologist)   OBJECTIVE:  Note: Objective measures were completed at evaluation unless otherwise noted.  FUNCTIONAL TESTS:  5 times sit to stand: 14.07 sec without UE Timed up and go (TUG): 14.34 sec Avg 6 minute walk test: 857ft  10 meter walk test: 0.74 m/s avg Berg Balance Scale: 47/56   TREATMENT DATE: 10/07/2023 TA- To improve functional movements patterns for everyday tasks   STS 2 x 10   NMR: To facilitate reeducation of movement, balance, posture, coordination, and/or proprioception/kinesthetic sense.  Ambulate across stable and unstable surface outside. Negotiating changing surfaces from grass to sidewalk, across brick with turns and obstacles in pathway without LOB. X 28 min with 2 x seated rest secondary to fatigue   Standing on airex pad:  NBOS x 30 sec  NBOS EC 2 x 30 sec   Stepping on  off airex x 20 reps   CGA for safety with min cues for posture and step length/foot clearance with lunges initially, and able to maintain without  addition instruction    PATIENT EDUCATION:  Education details: exercise modification to avoid pain in shoulders.  Person educated: Patient Education method: Explanation, Demonstration, Tactile cues, Verbal cues, and Handouts Education comprehension: verbalized understanding, returned demonstration, verbal cues required, tactile cues required, and needs further education  HOME EXERCISE PROGRAM: Access Code: [email or web address] URL: [email or web address] Date: 08/26/2023 Prepared by: Reyes London  Exercises - Seated Lumbar Flexion Stretch  - 1 x daily - 3 sets - 30 hold - Seated Hamstring Stretch  - 1 x daily - 3 sets - 30 hold  Access Code: [email or web address] URL:  [email or web address] Date: 08/28/2023 Prepared by: Massie Dollar  Exercises - Supine Lower Trunk Rotation  - 1 x daily - 7 x weekly - 3 sets - 10 reps - Supine Bridge  - 1 x daily - 7 x weekly - 3 sets - 10 reps - Supine Piriformis Stretch with Foot on Ground  - 1 x daily - 7 x weekly - 3 sets - 10 reps - Supine Figure 4 Piriformis Stretch  - 1 x daily - 7 x weekly - 3 sets - 10 reps  ASSESSMENT:  CLINICAL IMPRESSION:    Patient continuing to respond well to interventions prescribed in today's session. Focussed on functional movements and various outdoor terrain adaptation. Pt had 1 small stumble on grass terrain but recovered independently although PT was there to prevent any excessive LOB. Will continue to progress in future sessions. Pt will continue to benefit from skilled physical therapy intervention to address impairments, improve QOL, and attain therapy goals.    OBJECTIVE IMPAIRMENTS: Abnormal gait, decreased activity tolerance, decreased balance, decreased coordination, decreased endurance, decreased mobility, difficulty walking, decreased ROM, decreased strength, hypomobility, postural dysfunction, and pain.   ACTIVITY LIMITATIONS: carrying, lifting, bending, sitting, standing, squatting, sleeping, stairs, transfers, and bed mobility  PARTICIPATION LIMITATIONS: meal prep, cleaning, laundry, shopping, community activity, and yard work  PERSONAL FACTORS: Age, Time since onset of injury/illness/exacerbation, and 3+ comorbidities: OA, DDD  Previous surgeries,  are also affecting patient's functional outcome.   REHAB POTENTIAL: Good  CLINICAL DECISION MAKING: Evolving/moderate complexity  EVALUATION COMPLEXITY: Moderate   GOALS: Goals reviewed with patient? Yes SHORT TERM GOALS: Target date: 10/07/2023  Pt will be independent with HEP in order to improve strength and decrease back pain in order to improve pain-free function at home. Baseline: EVAL= No formal  HEP in place Goal status: INITIAL  LONG TERM GOALS: Target date: 11/18/2023  Pt will decrease MODI score by at least 8 points in order demonstrate clinically significant reduction in back pain/disability. Baseline: EVAL = 24 Goal status:  INITIAL  2.  Pt will decrease worst back pain as reported on NPRS by at least 2 points in order to demonstrate clinically significant reduction in back pain.  Baseline: Worst low back pain=6/10 Goal status: INITIAL  3.  Pt will improve BERG by at least 3 points in order to demonstrate clinically significant improvement in balance.  Baseline: 47/56  Goal status: INITIAL  4.  Pt will improve ABC by at least 13% in order to demonstrate clinically significant improvement in balance confidence Baseline: EVAL= 58.75% Goal status: INITIAL  5.  Pt will decrease 5TSTS by at least 3 seconds in order to demonstrate clinically significant improvement in LE strength. Baseline: 14.07 sec without UE Goal status: INITIAL  6.  Pt will increase by at least 15m (127ft) in order to demonstrate clinically significant improvement in cardiopulmonary endurance and community ambulation  Baseline: 825ft with no AD Goal status: INITIAL  PLAN:  PT FREQUENCY: 1-2x/week  PT DURATION: 12 weeks  PLANNED INTERVENTIONS: [id number]- PT Re-evaluation, 97750- Physical Performance Testing, 97110-Therapeutic exercises, 97530- Therapeutic activity, [id number]- Neuromuscular re-education, 97535- Self Care, [id number]- Manual therapy, [id number]- Gait training, [phone]- Orthotic Initial, [id number]- Orthotic/Prosthetic subsequent, [phone]- Canalith repositioning, [id number]- Electrical stimulation (manual), [phone] (1-2 muscles), [id number] (3+ muscles)- Dry Needling, Patient/Family education, Balance training, Stair training, Taping, Joint mobilization, Joint manipulation, Spinal manipulation, Spinal mobilization, Vestibular training, DME instructions, Cryotherapy, and Moist heat.  PLAN FOR NEXT SESSION:   -continue  strengthening  in Low back, thoracic, LE stretching  - open book stretches  - thoracic extension -Gait: address decreased knee extension, heel strike, guarded position leading to decreased arm swing & trunk rotation  -assess footwear  any balance, LE/low back/core strengthening to HEP)   Massie Dollar PT, DPT  Note: Portions of this document were prepared using Dragon voice recognition software and although reviewed may contain unintentional dictation errors in syntax, grammar, or spelling.  Lonni KATHEE Gainer PT ,DPT Physical Therapist- [place]  [facility]   1:21 PM 10/07/23

## 2023-10-09 ENCOUNTER — Ambulatory Visit: Admitting: Physical Therapy

## 2023-10-11 ENCOUNTER — Ambulatory Visit: Admitting: Physical Therapy

## 2023-10-11 DIAGNOSIS — R269 Unspecified abnormalities of gait and mobility: Secondary | ICD-10-CM | POA: Diagnosis not present

## 2023-10-11 DIAGNOSIS — R262 Difficulty in walking, not elsewhere classified: Secondary | ICD-10-CM

## 2023-10-11 DIAGNOSIS — M6281 Muscle weakness (generalized): Secondary | ICD-10-CM

## 2023-10-11 DIAGNOSIS — R2681 Unsteadiness on feet: Secondary | ICD-10-CM

## 2023-10-11 DIAGNOSIS — R2689 Other abnormalities of gait and mobility: Secondary | ICD-10-CM

## 2023-10-11 NOTE — Therapy (Signed)
 OUTPATIENT PHYSICAL THERAPY TREATMENT  Patient Name: Carlos Neal. MRN: [id number] DOB:1940-05-05, 83 y.o., male Today's Date: 10/11/2023  END OF SESSION:    PT End of Session - 10/11/23 0927     Visit Number 13    Number of Visits 24    Date for Recertification  11/18/23    Authorization Type [employer]    Progress Note Due on Visit 20    PT Start Time 0929    PT Stop Time 1012    PT Time Calculation (min) 43 min    Equipment Utilized During Treatment Gait belt    Activity Tolerance Patient tolerated treatment well    Behavior During Therapy WFL for tasks assessed/performed           Past Medical History:  Diagnosis Date   Anemia    Asthma    as a child   GERD (gastroesophageal reflux disease)    Hypertension    Osteoarthritis    back and knee   Osteopenia    Osteoporosis    Vitamin D  deficiency    Past Surgical History:  Procedure Laterality Date   BROW LIFT Bilateral 09/23/2020   Procedure: BLEPHAROPTOSIS REPAIR; RESECT EX  BROW PTOSIS REPAIR ECTROPION REPAIR, EXTENSIVE BILATERAL;  Surgeon: Ashley Greig HERO, MD;  Location: [facility];  Service: Ophthalmology;  Laterality: Bilateral;  wants to be later in the schedule   COLONOSCOPY  01/12/2013   COLONOSCOPY N/A 01/05/2022   Procedure: COLONOSCOPY;  Surgeon: Toledo, Ladell POUR, MD;  Location: [facility];  Service: Gastroenterology;  Laterality: N/A;   ESOPHAGOGASTRODUODENOSCOPY (EGD) WITH PROPOFOL  N/A 01/05/2022   Procedure: ESOPHAGOGASTRODUODENOSCOPY (EGD) WITH PROPOFOL ;  Surgeon: Toledo, Ladell POUR, MD;  Location: [facility];  Service: Gastroenterology;  Laterality: N/A;   GIVENS CAPSULE STUDY N/A 01/05/2022   Procedure: GIVENS CAPSULE STUDY;  Surgeon: Toledo, Ladell POUR, MD;  Location: [facility];  Service: Gastroenterology;  Laterality: N/A;   HYDROCELE EXCISION     age 21   INGUINAL HERNIA REPAIR Bilateral    as a child   KNEE ARTHROPLASTY Right 01/30/2021   Procedure: COMPUTER ASSISTED  TOTAL KNEE ARTHROPLASTY;  Surgeon: Mardee Lynwood SQUIBB, MD;  Location: [facility];  Service: Orthopedics;  Laterality: Right;   LUMBAR LAMINECTOMY/ DECOMPRESSION WITH MET-RX Right 01/15/2020   Procedure: L2-3 DECOMPRESSION, RIGHT L2-3 FAR LATERAL DISCECTOMY;  Surgeon: Clois Fret, MD;  Location: [facility];  Service: Neurosurgery;  Laterality: Right;  2nd case   TOTAL HIP ARTHROPLASTY Left 12/04/2021   Procedure: TOTAL HIP ARTHROPLASTY;  Surgeon: Mardee Lynwood SQUIBB, MD;  Location: [facility];  Service: Orthopedics;  Laterality: Left;   Patient Active Problem List   Diagnosis Date Noted   Senile purpura 09/05/2023   IDA (iron  deficiency anemia) 01/11/2022   AVM (arteriovenous malformation) 01/11/2022   Symptomatic anemia 01/03/2022   Positive D dimer 01/03/2022   H/O total hip arthroplasty, left 12/04/21 12/04/2021   Chronic left shoulder pain 11/09/2021   Primary osteoarthritis of left hip 08/22/2021   Status post total right knee replacement 01/30/2021   B12 deficiency 03/16/2020   BPH associated with nocturia 03/16/2020   Chronic pain syndrome 12/23/2019   Pharmacologic therapy 12/23/2019   Disorder of skeletal system 12/23/2019   Problems influencing health status 12/23/2019   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HCC) 12/23/2019   Chronic lower extremity pain (1ry area of Pain) (Right) 12/23/2019   Chronic low back pain (2ry area of Pain) (Bilateral) (R>L) w/ sciatica (Right) 12/23/2019   Abnormal MRI, lumbar  spine (12/04/2019) 12/23/2019   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) 12/23/2019   Retrolisthesis (T12-L1, L1-2) 12/23/2019   Lumbosacral foraminal stenosis 12/23/2019   Lumbar facet arthropathy 12/23/2019   Osteoarthritis of facet joint of lumbar spine 12/23/2019   Lumbar facet syndrome (Bilateral) 12/23/2019   Wedge compression fracture of T12 vertebra, sequela 12/23/2019   DDD (degenerative disc disease), lumbosacral 12/23/2019   DDD (degenerative disc disease), cervical 12/23/2019    Cervical facet syndrome (Right) 12/23/2019   Cervicalgia 12/23/2019   Chronic neck pain (Right) 12/23/2019   Essential hypertension 12/20/2019   GERD without esophagitis 12/20/2019   Hereditary hemochromatosis 12/20/2019   Impotence 12/20/2019   Insomnia 12/20/2019   Osteopenia 12/20/2019   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 Alda Carpen  REFERRING PROVIDER: Dr. Alda Carpen  REFERRING DIAG:  [phone] (ICD-10-CM) - Spinal stenosis, lumbar region, without neurogenic claudication  R26.81 (ICD-10-CM) - Unsteadiness on feet    Rationale for Evaluation and Treatment: Rehabilitation  THERAPY DIAG:   Abnormality of gait and mobility  Muscle weakness (generalized)  Unsteadiness on feet  Other abnormalities of gait and mobility  Difficulty in walking, not elsewhere classified  ONSET DATE: 2022  SUBJECTIVE:                                                                                                                                                                                           SUBJECTIVE STATEMENT:     Pt reports that he is doing well this AM. No changes to note since last visit.   PERTINENT HISTORY:  Per most recent PCP Visit note on 08/20/2023: Spinal stenosis of lumbar region without neurogenic claudication. Slightly improved but still symptomatic. Patient with unstable gait and lack of strength. Plan to refer to physical therapy for evaluation   PAIN:  Are you having pain? No pain   PRECAUTIONS: Fall  WEIGHT BEARING RESTRICTIONS: No  FALLS:  Has patient fallen in last 6 months? No  LIVING ENVIRONMENT: Lives with: lives with their spouse Lives in:  House/apartment Stairs: Yes: External: 3 steps; can reach both Has following equipment at home: Single point cane, Walker - 2 wheeled, and Grab bars  OCCUPATION: Retired - 2019.  [employer] and worked at [employer] prior to that.   PLOF: Independent  PATIENT GOALS: Improve my walking  NEXT MD VISIT: Dr. Babara (hematologist)   OBJECTIVE:  Note: Objective measures were completed at evaluation unless otherwise noted.  FUNCTIONAL TESTS:  5 times sit to stand: 14.07 sec without UE Timed up and go (TUG): 14.34 sec Avg 6 minute walk test: 866ft  10 meter walk test: 0.74 m/s avg Berg Balance Scale: 47/56   TREATMENT DATE: 10/11/2023 TA- To improve functional movements patterns for everyday tasks   Nustep SPM setting LE only 80 SPM goal level 4 average x 8 min  Step ups 2 x 10 ea LE with UE support   Standing heel raise on incline 3 x 10    NMR: To facilitate reeducation of movement, balance, posture, coordination, and/or proprioception/kinesthetic sense.  Kore balance trainer working on patients proprioception and standing balance on dynamic surface  Setting type(s): tux racer  Reps: 5 Comments: 4 rounds on bunny course and one round on more difficult intermediate course    CGA for safety with min cues for posture and step length/foot clearance with lunges initially, and able to maintain without  addition instruction  Pt required occasional rest breaks due fatigue, PT was attentive to when pt appeared to be tired or winded in order to prevent excessive fatigue.   PATIENT EDUCATION:  Education details: exercise modification to avoid pain in shoulders.  Person educated: Patient Education method: Explanation, Demonstration, Tactile cues, Verbal cues, and Handouts Education comprehension: verbalized understanding, returned demonstration, verbal cues required, tactile cues required, and needs further education  HOME EXERCISE PROGRAM: Access Code: [email or web address] URL:  [email or web address] Date: 08/26/2023 Prepared by: Reyes London  Exercises - Seated Lumbar Flexion Stretch  - 1 x daily - 3 sets - 30 hold - Seated Hamstring Stretch  - 1 x daily - 3 sets - 30 hold  Access Code: [email or web address] URL: [email or web address] Date: 08/28/2023 Prepared by: Massie Dollar  Exercises - Supine Lower Trunk Rotation  - 1 x daily - 7 x weekly - 3 sets - 10 reps - Supine Bridge  - 1 x daily - 7 x weekly - 3 sets - 10 reps - Supine Piriformis Stretch with Foot on Ground  - 1 x daily - 7 x weekly - 3 sets - 10 reps - Supine Figure 4 Piriformis Stretch  - 1 x daily - 7 x weekly - 3 sets - 10 reps  ASSESSMENT:  CLINICAL IMPRESSION:    Patient continuing to respond well to interventions prescribed in today's session. Focussed on functional movements and integrated KORE balance trainer to improve proprioceptive function. Pt progressing as expected and Pt will continue to benefit from skilled physical therapy intervention to address impairments, improve QOL, and attain therapy goals.   OBJECTIVE IMPAIRMENTS: Abnormal gait, decreased activity tolerance, decreased balance, decreased coordination, decreased endurance, decreased mobility, difficulty walking, decreased ROM, decreased strength, hypomobility, postural dysfunction, and pain.   ACTIVITY LIMITATIONS: carrying, lifting, bending, sitting, standing, squatting, sleeping, stairs, transfers, and bed mobility  PARTICIPATION LIMITATIONS: meal prep, cleaning, laundry, shopping, community activity, and yard work  PERSONAL FACTORS: Age, Time since onset of injury/illness/exacerbation, and 3+ comorbidities: OA, DDD  Previous surgeries,  are also affecting patient's functional outcome.   REHAB POTENTIAL: Good  CLINICAL DECISION MAKING: Evolving/moderate complexity  EVALUATION COMPLEXITY: Moderate   GOALS: Goals reviewed with patient? Yes SHORT TERM GOALS: Target date: 10/07/2023  Pt will  be independent with HEP in order to improve strength and decrease back pain in order to improve pain-free function at home. Baseline: EVAL= No formal HEP in place Goal status: INITIAL  LONG TERM GOALS: Target date: 11/18/2023  Pt will decrease MODI score by at least 8 points in order demonstrate clinically significant reduction in back pain/disability. Baseline: EVAL = 24 Goal status: INITIAL  2.  Pt will decrease worst back pain as reported on NPRS by at least 2 points in order to demonstrate clinically significant reduction in back pain.  Baseline: Worst low back pain=6/10 Goal status: INITIAL  3.  Pt will improve BERG by at least 3 points in order to demonstrate clinically significant improvement in balance.  Baseline: 47/56  Goal status: INITIAL  4.  Pt will improve ABC by at least 13% in order to demonstrate clinically significant improvement in balance confidence Baseline: EVAL= 58.75% Goal status: INITIAL  5.  Pt will decrease 5TSTS by at least 3 seconds in order to demonstrate clinically significant improvement in LE strength. Baseline: 14.07 sec without UE Goal status: INITIAL  6.  Pt will increase by at least 42m (129ft) in order to demonstrate clinically significant improvement in cardiopulmonary endurance and community ambulation  Baseline: 860ft with no AD Goal status: INITIAL  PLAN:  PT FREQUENCY: 1-2x/week  PT DURATION: 12 weeks  PLANNED INTERVENTIONS: [id number]- PT Re-evaluation, 97750- Physical Performance Testing, 97110-Therapeutic exercises, 97530- Therapeutic activity, [id number]- Neuromuscular re-education, 97535- Self Care, [id number]- Manual therapy, [id number]- Gait training, [phone]- Orthotic Initial, [id number]- Orthotic/Prosthetic subsequent, [phone]- Canalith repositioning, [id number]- Electrical stimulation (manual), [phone] (1-2 muscles), [id number] (3+ muscles)- Dry Needling, Patient/Family education, Balance training, Stair training, Taping, Joint mobilization, Joint manipulation,  Spinal manipulation, Spinal mobilization, Vestibular training, DME instructions, Cryotherapy, and Moist heat.  PLAN FOR NEXT SESSION:   -continue strengthening  in Low back, thoracic, LE stretching  - open book stretches  - thoracic extension -Gait: address decreased knee extension, heel strike, guarded position leading to decreased arm swing & trunk rotation  -assess footwear  any balance, LE/low back/core strengthening to HEP)    Note: Portions of this document were prepared using Dragon voice recognition software and although reviewed may contain unintentional dictation errors in syntax, grammar, or spelling.  Lonni KATHEE Gainer PT ,DPT Physical Therapist- [facility]   10:59 AM 10/11/23

## 2023-10-14 ENCOUNTER — Ambulatory Visit: Admitting: Physical Therapy

## 2023-10-14 DIAGNOSIS — R269 Unspecified abnormalities of gait and mobility: Secondary | ICD-10-CM | POA: Diagnosis not present

## 2023-10-14 DIAGNOSIS — R2681 Unsteadiness on feet: Secondary | ICD-10-CM

## 2023-10-14 DIAGNOSIS — R262 Difficulty in walking, not elsewhere classified: Secondary | ICD-10-CM

## 2023-10-14 DIAGNOSIS — R2689 Other abnormalities of gait and mobility: Secondary | ICD-10-CM

## 2023-10-14 DIAGNOSIS — M6281 Muscle weakness (generalized): Secondary | ICD-10-CM

## 2023-10-14 NOTE — Therapy (Signed)
 OUTPATIENT PHYSICAL THERAPY TREATMENT  Patient Name: Carlos Neal. MRN: [id number] DOB:[birth year], 83 y.o., male Today's Date: 10/14/2023  END OF SESSION:    PT End of Session - 10/14/23 1149     Visit Number 14    Number of Visits 24    Date for Recertification  11/18/23    Authorization Type [employer]    Progress Note Due on Visit 20    PT Start Time 1145    PT Stop Time 1225    PT Time Calculation (min) 40 min    Equipment Utilized During Treatment Gait belt    Activity Tolerance Patient tolerated treatment well    Behavior During Therapy WFL for tasks assessed/performed            Past Medical History:  Diagnosis Date   Anemia    Asthma    as a child   GERD (gastroesophageal reflux disease)    Hypertension    Osteoarthritis    back and knee   Osteopenia    Osteoporosis    Vitamin D  deficiency    Past Surgical History:  Procedure Laterality Date   BROW LIFT Bilateral 09/23/2020   Procedure: BLEPHAROPTOSIS REPAIR; RESECT EX  BROW PTOSIS REPAIR ECTROPION REPAIR, EXTENSIVE BILATERAL;  Surgeon: Ashley Greig HERO, MD;  Location: [facility];  Service: Ophthalmology;  Laterality: Bilateral;  wants to be later in the schedule   COLONOSCOPY  01/12/2013   COLONOSCOPY N/A 01/05/2022   Procedure: COLONOSCOPY;  Surgeon: Toledo, Ladell POUR, MD;  Location: [facility];  Service: Gastroenterology;  Laterality: N/A;   ESOPHAGOGASTRODUODENOSCOPY (EGD) WITH PROPOFOL  N/A 01/05/2022   Procedure: ESOPHAGOGASTRODUODENOSCOPY (EGD) WITH PROPOFOL ;  Surgeon: Toledo, Ladell POUR, MD;  Location: [facility];  Service: Gastroenterology;  Laterality: N/A;   GIVENS CAPSULE STUDY N/A 01/05/2022   Procedure: GIVENS CAPSULE STUDY;  Surgeon: Toledo, Ladell POUR, MD;  Location: [facility];  Service: Gastroenterology;  Laterality: N/A;   HYDROCELE EXCISION     age 4   INGUINAL HERNIA REPAIR Bilateral    as a child   KNEE ARTHROPLASTY Right 01/30/2021   Procedure: COMPUTER  ASSISTED TOTAL KNEE ARTHROPLASTY;  Surgeon: Mardee Lynwood SQUIBB, MD;  Location: [facility];  Service: Orthopedics;  Laterality: Right;   LUMBAR LAMINECTOMY/ DECOMPRESSION WITH MET-RX Right 01/15/2020   Procedure: L2-3 DECOMPRESSION, RIGHT L2-3 FAR LATERAL DISCECTOMY;  Surgeon: Clois Fret, MD;  Location: [facility];  Service: Neurosurgery;  Laterality: Right;  2nd case   TOTAL HIP ARTHROPLASTY Left 12/04/2021   Procedure: TOTAL HIP ARTHROPLASTY;  Surgeon: Mardee Lynwood SQUIBB, MD;  Location: [facility];  Service: Orthopedics;  Laterality: Left;   Patient Active Problem List   Diagnosis Date Noted   Senile purpura 09/05/2023   IDA (iron  deficiency anemia) 01/11/2022   AVM (arteriovenous malformation) 01/11/2022   Symptomatic anemia 01/03/2022   Positive D dimer 01/03/2022   H/O total hip arthroplasty, left 12/04/21 12/04/2021   Chronic left shoulder pain 11/09/2021   Primary osteoarthritis of left hip 08/22/2021   Status post total right knee replacement 01/30/2021   B12 deficiency 03/16/2020   BPH associated with nocturia 03/16/2020   Chronic pain syndrome 12/23/2019   Pharmacologic therapy 12/23/2019   Disorder of skeletal system 12/23/2019   Problems influencing health status 12/23/2019   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HCC) 12/23/2019   Chronic lower extremity pain (1ry area of Pain) (Right) 12/23/2019   Chronic low back pain (2ry area of Pain) (Bilateral) (R>L) w/ sciatica (Right) 12/23/2019   Abnormal MRI,  lumbar spine (12/04/2019) 12/23/2019   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) 12/23/2019   Retrolisthesis (T12-L1, L1-2) 12/23/2019   Lumbosacral foraminal stenosis 12/23/2019   Lumbar facet arthropathy 12/23/2019   Osteoarthritis of facet joint of lumbar spine 12/23/2019   Lumbar facet syndrome (Bilateral) 12/23/2019   Wedge compression fracture of T12 vertebra, sequela 12/23/2019   DDD (degenerative disc disease), lumbosacral 12/23/2019   DDD (degenerative disc disease), cervical  12/23/2019   Cervical facet syndrome (Right) 12/23/2019   Cervicalgia 12/23/2019   Chronic neck pain (Right) 12/23/2019   Essential hypertension 12/20/2019   GERD without esophagitis 12/20/2019   Hereditary hemochromatosis 12/20/2019   Impotence 12/20/2019   Insomnia 12/20/2019   Osteopenia 12/20/2019   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 Alda Carpen  REFERRING PROVIDER: Dr. Alda Carpen  REFERRING DIAG:  [phone] (ICD-10-CM) - Spinal stenosis, lumbar region, without neurogenic claudication  R26.81 (ICD-10-CM) - Unsteadiness on feet    Rationale for Evaluation and Treatment: Rehabilitation  THERAPY DIAG:   Abnormality of gait and mobility  Muscle weakness (generalized)  Unsteadiness on feet  Other abnormalities of gait and mobility  Difficulty in walking, not elsewhere classified  ONSET DATE: 2022  SUBJECTIVE:                                                                                                                                                                                           SUBJECTIVE STATEMENT:     Pt reports that he is doing well this AM. No changes to note since last visit.   PERTINENT HISTORY:  Per most recent PCP Visit note on 08/20/2023: Spinal stenosis of lumbar region without neurogenic claudication. Slightly improved but still symptomatic. Patient with unstable gait and lack of strength. Plan to refer to physical therapy for evaluation   PAIN:  Are you having pain? No pain   PRECAUTIONS: Fall  WEIGHT BEARING RESTRICTIONS: No  FALLS:  Has patient fallen in last 6 months? No  LIVING ENVIRONMENT: Lives with: lives with their  spouse Lives in: House/apartment Stairs: Yes: External: 3 steps; can reach both Has following equipment at home: Single point cane, Walker - 2 wheeled, and Grab bars  OCCUPATION: Retired - 2019.  [employer] and worked at [employer] prior to that.   PLOF: Independent  PATIENT GOALS: Improve my walking  NEXT MD VISIT: Dr. Babara (hematologist)   OBJECTIVE:  Note: Objective measures were completed at evaluation unless otherwise noted.  FUNCTIONAL TESTS:  5 times sit to stand: 14.07 sec without UE Timed up and go (TUG): 14.34 sec Avg 6 minute walk test: 873ft  10 meter walk test: 0.74 m/s avg Berg Balance Scale: 47/56   TREATMENT DATE: 10/14/2023 TA- To improve functional movements patterns for everyday tasks   Nustep rolling hills intervals setting x 8 min level 3-7  Leg press 3 x 10 @ 40 #- pt rates hard, could probably increase resistance next session based on overall response following activity   Step ups 2 x 10 ea LE without UE support   Standing heel raise on incline 3 x 10   Seated toe raise 2 x 20 reps on decline    NMR: To facilitate reeducation of movement, balance, posture, coordination, and/or proprioception/kinesthetic sense.  Lateral stepping on Blazejewski airex beam 2 x 5 laps   Kore balance trainer working on patients proprioception and standing balance on dynamic surface  Setting type(s): tux racer  Reps: 3 Comments: 1 rounds on bunny course ( 1-2 min) and one round on more difficult intermediate course  x 6 min   CGA for safety with min cues for posture and step length/foot clearance with lunges initially, and able to maintain without  addition instruction  Pt required occasional rest breaks due fatigue, PT was attentive to when pt appeared to be tired or winded in order to prevent excessive fatigue.   PATIENT EDUCATION:  Education details: exercise modification to avoid pain in shoulders.  Person educated: Patient Education method:  Explanation, Demonstration, Tactile cues, Verbal cues, and Handouts Education comprehension: verbalized understanding, returned demonstration, verbal cues required, tactile cues required, and needs further education  HOME EXERCISE PROGRAM: Access Code: [email or web address] URL: [email or web address] Date: 08/26/2023 Prepared by: Reyes London  Exercises - Seated Lumbar Flexion Stretch  - 1 x daily - 3 sets - 30 hold - Seated Hamstring Stretch  - 1 x daily - 3 sets - 30 hold  Access Code: [email or web address] URL: [email or web address] Date: 08/28/2023 Prepared by: Massie Dollar  Exercises - Supine Lower Trunk Rotation  - 1 x daily - 7 x weekly - 3 sets - 10 reps - Supine Bridge  - 1 x daily - 7 x weekly - 3 sets - 10 reps - Supine Piriformis Stretch with Foot on Ground  - 1 x daily - 7 x weekly - 3 sets - 10 reps - Supine Figure 4 Piriformis Stretch  - 1 x daily - 7 x weekly - 3 sets - 10 reps  ASSESSMENT:  CLINICAL IMPRESSION:    Patient continuing to respond well to interventions prescribed in today's session. Focussed on functional movements and integrated KORE balance trainer to improve proprioceptive function. Pt progressing as expected continues to work on his SLS strength and overall LE muscular strength. Pt will continue to benefit from skilled physical therapy intervention to address impairments, improve QOL, and attain therapy goals.   OBJECTIVE IMPAIRMENTS: Abnormal gait, decreased activity tolerance, decreased balance, decreased coordination, decreased endurance, decreased mobility, difficulty walking, decreased ROM, decreased strength, hypomobility, postural dysfunction, and pain.   ACTIVITY LIMITATIONS: carrying, lifting, bending, sitting, standing, squatting, sleeping, stairs, transfers, and bed mobility  PARTICIPATION LIMITATIONS: meal prep, cleaning, laundry, shopping, community activity, and yard work  PERSONAL FACTORS: Age, Time since onset of  injury/illness/exacerbation, and 3+ comorbidities: OA, DDD  Previous surgeries,  are also affecting patient's functional outcome.   REHAB POTENTIAL: Good  CLINICAL DECISION MAKING: Evolving/moderate  complexity  EVALUATION COMPLEXITY: Moderate   GOALS: Goals reviewed with patient? Yes SHORT TERM GOALS: Target date: 10/07/2023  Pt will be independent with HEP in order to improve strength and decrease back pain in order to improve pain-free function at home. Baseline: EVAL= No formal HEP in place Goal status: INITIAL  LONG TERM GOALS: Target date: 11/18/2023  Pt will decrease MODI score by at least 8 points in order demonstrate clinically significant reduction in back pain/disability. Baseline: EVAL = 24 Goal status: INITIAL  2.  Pt will decrease worst back pain as reported on NPRS by at least 2 points in order to demonstrate clinically significant reduction in back pain.  Baseline: Worst low back pain=6/10 Goal status: INITIAL  3.  Pt will improve BERG by at least 3 points in order to demonstrate clinically significant improvement in balance.  Baseline: 47/56  Goal status: INITIAL  4.  Pt will improve ABC by at least 13% in order to demonstrate clinically significant improvement in balance confidence Baseline: EVAL= 58.75% Goal status: INITIAL  5.  Pt will decrease 5TSTS by at least 3 seconds in order to demonstrate clinically significant improvement in LE strength. Baseline: 14.07 sec without UE Goal status: INITIAL  6.  Pt will increase by at least 66m (16ft) in order to demonstrate clinically significant improvement in cardiopulmonary endurance and community ambulation  Baseline: 830ft with no AD Goal status: INITIAL  PLAN:  PT FREQUENCY: 1-2x/week  PT DURATION: 12 weeks  PLANNED INTERVENTIONS: [id number]- PT Re-evaluation, 97750- Physical Performance Testing, 97110-Therapeutic exercises, 97530- Therapeutic activity, [id number]- Neuromuscular re-education, 97535- Self  Care, 02859- Manual therapy, [id number]- Gait training, [phone]- Orthotic Initial, [id number]- Orthotic/Prosthetic subsequent, [phone]- Canalith repositioning, [id number]- Electrical stimulation (manual), [phone] (1-2 muscles), [id number] (3+ muscles)- Dry Needling, Patient/Family education, Balance training, Stair training, Taping, Joint mobilization, Joint manipulation, Spinal manipulation, Spinal mobilization, Vestibular training, DME instructions, Cryotherapy, and Moist heat.  PLAN FOR NEXT SESSION:   -continue strengthening  in Low back, thoracic, LE stretching  - open book stretches  - thoracic extension -Gait: address decreased knee extension, heel strike, guarded position leading to decreased arm swing & trunk rotation  -assess footwear  any balance, LE/low back/core strengthening to HEP)    Note: Portions of this document were prepared using Dragon voice recognition software and although reviewed may contain unintentional dictation errors in syntax, grammar, or spelling.  Lonni KATHEE Gainer PT ,DPT Physical Therapist- [facility]   11:50 AM 10/14/23

## 2023-10-16 ENCOUNTER — Ambulatory Visit: Admitting: Physical Therapy

## 2023-10-17 ENCOUNTER — Ambulatory Visit

## 2023-10-17 DIAGNOSIS — M6281 Muscle weakness (generalized): Secondary | ICD-10-CM

## 2023-10-17 DIAGNOSIS — R2689 Other abnormalities of gait and mobility: Secondary | ICD-10-CM

## 2023-10-17 DIAGNOSIS — R262 Difficulty in walking, not elsewhere classified: Secondary | ICD-10-CM

## 2023-10-17 DIAGNOSIS — R2681 Unsteadiness on feet: Secondary | ICD-10-CM

## 2023-10-17 DIAGNOSIS — R269 Unspecified abnormalities of gait and mobility: Secondary | ICD-10-CM | POA: Diagnosis not present

## 2023-10-17 NOTE — Therapy (Signed)
 OUTPATIENT PHYSICAL THERAPY TREATMENT  Patient Name: Carlos Neal. MRN: [id number] DOB:12-13-1940, 83 y.o., male Today's Date: 10/17/2023  END OF SESSION:    PT End of Session - 10/17/23 1113     Visit Number 15    Number of Visits 24    Date for Recertification  11/18/23    Authorization Type [employer]    Progress Note Due on Visit 20    PT Start Time 1105    PT Stop Time 1145    PT Time Calculation (min) 40 min    Equipment Utilized During Treatment Gait belt    Activity Tolerance Patient tolerated treatment well    Behavior During Therapy WFL for tasks assessed/performed            Past Medical History:  Diagnosis Date   Anemia    Asthma    as a child   GERD (gastroesophageal reflux disease)    Hypertension    Osteoarthritis    back and knee   Osteopenia    Osteoporosis    Vitamin D  deficiency    Past Surgical History:  Procedure Laterality Date   BROW LIFT Bilateral 09/23/2020   Procedure: BLEPHAROPTOSIS REPAIR; RESECT EX  BROW PTOSIS REPAIR ECTROPION REPAIR, EXTENSIVE BILATERAL;  Surgeon: Ashley Greig HERO, MD;  Location: [facility];  Service: Ophthalmology;  Laterality: Bilateral;  wants to be later in the schedule   COLONOSCOPY  01/12/2013   COLONOSCOPY N/A 01/05/2022   Procedure: COLONOSCOPY;  Surgeon: Toledo, Ladell POUR, MD;  Location: [facility];  Service: Gastroenterology;  Laterality: N/A;   ESOPHAGOGASTRODUODENOSCOPY (EGD) WITH PROPOFOL  N/A 01/05/2022   Procedure: ESOPHAGOGASTRODUODENOSCOPY (EGD) WITH PROPOFOL ;  Surgeon: Toledo, Ladell POUR, MD;  Location: [facility];  Service: Gastroenterology;  Laterality: N/A;   GIVENS CAPSULE STUDY N/A 01/05/2022   Procedure: GIVENS CAPSULE STUDY;  Surgeon: Toledo, Ladell POUR, MD;  Location: [facility];  Service: Gastroenterology;  Laterality: N/A;   HYDROCELE EXCISION     age 89   INGUINAL HERNIA REPAIR Bilateral    as a child   KNEE ARTHROPLASTY Right 01/30/2021   Procedure: COMPUTER  ASSISTED TOTAL KNEE ARTHROPLASTY;  Surgeon: Mardee Lynwood SQUIBB, MD;  Location: [facility];  Service: Orthopedics;  Laterality: Right;   LUMBAR LAMINECTOMY/ DECOMPRESSION WITH MET-RX Right 01/15/2020   Procedure: L2-3 DECOMPRESSION, RIGHT L2-3 FAR LATERAL DISCECTOMY;  Surgeon: Clois Fret, MD;  Location: [facility];  Service: Neurosurgery;  Laterality: Right;  2nd case   TOTAL HIP ARTHROPLASTY Left 12/04/2021   Procedure: TOTAL HIP ARTHROPLASTY;  Surgeon: Mardee Lynwood SQUIBB, MD;  Location: [facility];  Service: Orthopedics;  Laterality: Left;   Patient Active Problem List   Diagnosis Date Noted   Senile purpura 09/05/2023   IDA (iron  deficiency anemia) 01/11/2022   AVM (arteriovenous malformation) 01/11/2022   Symptomatic anemia 01/03/2022   Positive D dimer 01/03/2022   H/O total hip arthroplasty, left 12/04/21 12/04/2021   Chronic left shoulder pain 11/09/2021   Primary osteoarthritis of left hip 08/22/2021   Status post total right knee replacement 01/30/2021   B12 deficiency 03/16/2020   BPH associated with nocturia 03/16/2020   Chronic pain syndrome 12/23/2019   Pharmacologic therapy 12/23/2019   Disorder of skeletal system 12/23/2019   Problems influencing health status 12/23/2019   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HCC) 12/23/2019   Chronic lower extremity pain (1ry area of Pain) (Right) 12/23/2019   Chronic low back pain (2ry area of Pain) (Bilateral) (R>L) w/ sciatica (Right) 12/23/2019   Abnormal MRI,  lumbar spine (12/04/2019) 12/23/2019   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) 12/23/2019   Retrolisthesis (T12-L1, L1-2) 12/23/2019   Lumbosacral foraminal stenosis 12/23/2019   Lumbar facet arthropathy 12/23/2019   Osteoarthritis of facet joint of lumbar spine 12/23/2019   Lumbar facet syndrome (Bilateral) 12/23/2019   Wedge compression fracture of T12 vertebra, sequela 12/23/2019   DDD (degenerative disc disease), lumbosacral 12/23/2019   DDD (degenerative disc disease), cervical  12/23/2019   Cervical facet syndrome (Right) 12/23/2019   Cervicalgia 12/23/2019   Chronic neck pain (Right) 12/23/2019   Essential hypertension 12/20/2019   GERD without esophagitis 12/20/2019   Hereditary hemochromatosis 12/20/2019   Impotence 12/20/2019   Insomnia 12/20/2019   Osteopenia 12/20/2019   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 Alda Carpen  REFERRING PROVIDER: Dr. Alda Carpen  REFERRING DIAG:  [phone] (ICD-10-CM) - Spinal stenosis, lumbar region, without neurogenic claudication  R26.81 (ICD-10-CM) - Unsteadiness on feet    Rationale for Evaluation and Treatment: Rehabilitation  THERAPY DIAG:   Abnormality of gait and mobility  Muscle weakness (generalized)  Unsteadiness on feet  Other abnormalities of gait and mobility  Difficulty in walking, not elsewhere classified  ONSET DATE: 2022  SUBJECTIVE:                                                                                                                                                                                           SUBJECTIVE STATEMENT:    Pt reports that he is doing well this AM. Pt needs to go to [employer] after session. Pt asks about returning to his fitness center and decreasing his visit frequency to 1x/week.   PERTINENT HISTORY:  Per most recent PCP Visit note on 08/20/2023: Spinal stenosis of lumbar region without neurogenic claudication. Slightly improved but still symptomatic. Patient with unstable gait and lack of strength. Plan to refer to physical therapy for evaluation   PAIN:  Are you having pain? No pain   PRECAUTIONS: Fall  WEIGHT BEARING RESTRICTIONS: No  FALLS:  Has  patient fallen in last 6 months? No  LIVING ENVIRONMENT: Lives with: lives with their spouse Lives in: House/apartment Stairs: Yes: External: 3 steps; can reach both Has following equipment at home: Single point cane, Walker - 2 wheeled, and Grab bars  OCCUPATION: Retired - 2019.  [employer] and worked at [employer] prior to that.   PLOF: Independent  PATIENT  GOALS: Improve my walking  NEXT MD VISIT: Dr. Babara (hematologist)   OBJECTIVE:  Note: Objective measures were completed at evaluation unless otherwise noted.  TREATMENT DATE: 10/17/2023 -overground AMB 957ft , no device, 2 Rt foot scuffs, 3m5sec  Seated recovery  -cable resisted AMB 12.5lb backward twice, forward twice Seated recovery  -cable resisted AMB 12.5lb backward twice, forward twice (added a red band to knees this time)  Seated recovery   -Leg press 1x20 @ 40lb + add on unit, seat 1  -Standing Double Heel Raise x25 -Leg press 1x15 @ 55lb, seat 1  (asked pt to do 10)  -Standing Double Heel Raise x25 -Leg press 1x25 @ 55lb, seat 1  (asked pt to do 15)    PATIENT EDUCATION:  Education details: exercise modification to avoid pain in shoulders.  Person educated: Patient Education method: Explanation, Demonstration, Tactile cues, Verbal cues, and Handouts Education comprehension: verbalized understanding, returned demonstration, verbal cues required, tactile cues required, and needs further education  HOME EXERCISE PROGRAM: Access Code: [email or web address] URL: [email or web address] Date: 08/26/2023 Prepared by: Reyes London  Exercises - Seated Lumbar Flexion Stretch  - 1 x daily - 3 sets - 30 hold - Seated Hamstring Stretch  - 1 x daily - 3 sets - 30 hold  Access Code: [email or web address] URL: [email or web address] Date: 08/28/2023 Prepared by: Massie Dollar  Exercises - Supine Lower Trunk Rotation  - 1 x daily - 7 x weekly - 3 sets - 10 reps - Supine Bridge  - 1 x daily - 7 x  weekly - 3 sets - 10 reps - Supine Piriformis Stretch with Foot on Ground  - 1 x daily - 7 x weekly - 3 sets - 10 reps - Supine Figure 4 Piriformis Stretch  - 1 x daily - 7 x weekly - 3 sets - 10 reps  ASSESSMENT:  CLINICAL IMPRESSION:   Pt continues to do exceptionally well overall, still improving in strength and is fairly meticulous with balance activity. Pt very much motivated to continued with PT, although he wants to start preparing for transition back to fitness center as well. Pt will continue to benefit from skilled physical therapy intervention to address impairments, improve QOL, and attain therapy goals.   OBJECTIVE IMPAIRMENTS: Abnormal gait, decreased activity tolerance, decreased balance, decreased coordination, decreased endurance, decreased mobility, difficulty walking, decreased ROM, decreased strength, hypomobility, postural dysfunction, and pain.   ACTIVITY LIMITATIONS: carrying, lifting, bending, sitting, standing, squatting, sleeping, stairs, transfers, and bed mobility  PARTICIPATION LIMITATIONS: meal prep, cleaning, laundry, shopping, community activity, and yard work  PERSONAL FACTORS: Age, Time since onset of injury/illness/exacerbation, and 3+ comorbidities: OA, DDD  Previous surgeries,  are also affecting patient's functional outcome.   REHAB POTENTIAL: Good  CLINICAL DECISION MAKING: Evolving/moderate complexity  EVALUATION COMPLEXITY: Moderate   GOALS: Goals reviewed with patient? Yes SHORT TERM GOALS: Target date: 10/07/2023  Pt will be independent with HEP in order to improve strength and decrease back pain in order to improve pain-free function at home. Baseline: EVAL= No formal HEP in place Goal status: MET  LONG TERM GOALS: Target date: 11/18/2023  Pt will decrease MODI score by at least 8 points in order demonstrate clinically significant reduction in back pain/disability. Baseline: EVAL = 24 Goal status: INITIAL  2.  Pt will decrease worst  back pain as reported on NPRS by at least 2 points in order to demonstrate clinically significant reduction in back pain.  Baseline: Worst low back pain=6/10 Goal status: INITIAL  3.  Pt will improve BERG by at least 3 points in order to demonstrate clinically significant improvement in balance.  Baseline: 47/56; 09/20/23: 52/56 Goal status: MET 09/30/23  4.  Pt will improve ABC by at least 13% in order to demonstrate clinically significant improvement in balance confidence Baseline: EVAL= 58.75% Goal status: INITIAL  5.  Pt will decrease 5TSTS by at least 3 seconds in order to demonstrate clinically significant improvement in LE strength. Baseline: 14.07 sec without UE; 09/30/23: 8.15sec Goal status: MET 09/30/23  6.  Pt will increase by at least 11m (170ft) in order to demonstrate clinically significant improvement in cardiopulmonary endurance and community ambulation  Baseline: 870ft with no AD; 09/30/23: 1025 no device  Goal status: MET 9/29  PLAN:  PT FREQUENCY: 1-2x/week  PT DURATION: 12 weeks  PLANNED INTERVENTIONS: 97164- PT Re-evaluation, 97750- Physical Performance Testing, 97110-Therapeutic exercises, 97530- Therapeutic activity, 97112- Neuromuscular re-education, 97535- Self Care, 02859- Manual therapy, [id number]- Gait training, [id number]- Orthotic Initial, [id number]- Orthotic/Prosthetic subsequent, [phone]- Canalith repositioning, [id number]- Electrical stimulation (manual), [phone] (1-2 muscles), [id number] (3+ muscles)- Dry Needling, Patient/Family education, Balance training, Stair training, Taping, Joint mobilization, Joint manipulation, Spinal manipulation, Spinal mobilization, Vestibular training, DME instructions, Cryotherapy, and Moist heat.  PLAN FOR NEXT SESSION:   Give HEP for fitness center Decrease frequency to 1x/week Repeat ABC scale, MODI, LTG2  Peggye JAYSON Linear PT ,DPT Physical Therapist- [facility]   11:14 AM 10/17/23   11:59 AM,  10/17/23 Peggye JAYSON Linear, PT, DPT Physical Therapist - [facility]  Outpatient Physical Therapy- Main Campus [phone]

## 2023-10-21 ENCOUNTER — Ambulatory Visit

## 2023-10-21 DIAGNOSIS — R2689 Other abnormalities of gait and mobility: Secondary | ICD-10-CM

## 2023-10-21 DIAGNOSIS — R262 Difficulty in walking, not elsewhere classified: Secondary | ICD-10-CM

## 2023-10-21 DIAGNOSIS — R269 Unspecified abnormalities of gait and mobility: Secondary | ICD-10-CM | POA: Diagnosis not present

## 2023-10-21 DIAGNOSIS — M5459 Other low back pain: Secondary | ICD-10-CM

## 2023-10-21 DIAGNOSIS — M6281 Muscle weakness (generalized): Secondary | ICD-10-CM

## 2023-10-21 DIAGNOSIS — R2681 Unsteadiness on feet: Secondary | ICD-10-CM

## 2023-10-21 NOTE — Therapy (Signed)
 OUTPATIENT PHYSICAL THERAPY TREATMENT  Patient Name: Carlos Neal. MRN: [id number] DOB:06-06-1940, 83 y.o., male Today's Date: 10/21/2023  END OF SESSION:    PT End of Session - 10/21/23 1104     Visit Number 16    Number of Visits 24    Date for Recertification  11/18/23    Authorization Type [employer]    Progress Note Due on Visit 20    PT Start Time 1100    PT Stop Time 1140    PT Time Calculation (min) 40 min    Equipment Utilized During Treatment Gait belt    Activity Tolerance Patient tolerated treatment well    Behavior During Therapy WFL for tasks assessed/performed            Past Medical History:  Diagnosis Date   Anemia    Asthma    as a child   GERD (gastroesophageal reflux disease)    Hypertension    Osteoarthritis    back and knee   Osteopenia    Osteoporosis    Vitamin D  deficiency    Past Surgical History:  Procedure Laterality Date   BROW LIFT Bilateral 09/23/2020   Procedure: BLEPHAROPTOSIS REPAIR; RESECT EX  BROW PTOSIS REPAIR ECTROPION REPAIR, EXTENSIVE BILATERAL;  Surgeon: Ashley Greig HERO, MD;  Location: [facility];  Service: Ophthalmology;  Laterality: Bilateral;  wants to be later in the schedule   COLONOSCOPY  01/12/2013   COLONOSCOPY N/A 01/05/2022   Procedure: COLONOSCOPY;  Surgeon: Toledo, Ladell POUR, MD;  Location: [facility];  Service: Gastroenterology;  Laterality: N/A;   ESOPHAGOGASTRODUODENOSCOPY (EGD) WITH PROPOFOL  N/A 01/05/2022   Procedure: ESOPHAGOGASTRODUODENOSCOPY (EGD) WITH PROPOFOL ;  Surgeon: Toledo, Ladell POUR, MD;  Location: [facility];  Service: Gastroenterology;  Laterality: N/A;   GIVENS CAPSULE STUDY N/A 01/05/2022   Procedure: GIVENS CAPSULE STUDY;  Surgeon: Toledo, Ladell POUR, MD;  Location: [facility];  Service: Gastroenterology;  Laterality: N/A;   HYDROCELE EXCISION     age 54   INGUINAL HERNIA REPAIR Bilateral    as a child   KNEE ARTHROPLASTY Right 01/30/2021   Procedure: COMPUTER  ASSISTED TOTAL KNEE ARTHROPLASTY;  Surgeon: Mardee Lynwood SQUIBB, MD;  Location: [facility];  Service: Orthopedics;  Laterality: Right;   LUMBAR LAMINECTOMY/ DECOMPRESSION WITH MET-RX Right 01/15/2020   Procedure: L2-3 DECOMPRESSION, RIGHT L2-3 FAR LATERAL DISCECTOMY;  Surgeon: Clois Fret, MD;  Location: [facility];  Service: Neurosurgery;  Laterality: Right;  2nd case   TOTAL HIP ARTHROPLASTY Left 12/04/2021   Procedure: TOTAL HIP ARTHROPLASTY;  Surgeon: Mardee Lynwood SQUIBB, MD;  Location: [facility];  Service: Orthopedics;  Laterality: Left;   Patient Active Problem List   Diagnosis Date Noted   Senile purpura 09/05/2023   IDA (iron  deficiency anemia) 01/11/2022   AVM (arteriovenous malformation) 01/11/2022   Symptomatic anemia 01/03/2022   Positive D dimer 01/03/2022   H/O total hip arthroplasty, left 12/04/21 12/04/2021   Chronic left shoulder pain 11/09/2021   Primary osteoarthritis of left hip 08/22/2021   Status post total right knee replacement 01/30/2021   B12 deficiency 03/16/2020   BPH associated with nocturia 03/16/2020   Chronic pain syndrome 12/23/2019   Pharmacologic therapy 12/23/2019   Disorder of skeletal system 12/23/2019   Problems influencing health status 12/23/2019   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HCC) 12/23/2019   Chronic lower extremity pain (1ry area of Pain) (Right) 12/23/2019   Chronic low back pain (2ry area of Pain) (Bilateral) (R>L) w/ sciatica (Right) 12/23/2019   Abnormal MRI,  lumbar spine (12/04/2019) 12/23/2019   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) 12/23/2019   Retrolisthesis (T12-L1, L1-2) 12/23/2019   Lumbosacral foraminal stenosis 12/23/2019   Lumbar facet arthropathy 12/23/2019   Osteoarthritis of facet joint of lumbar spine 12/23/2019   Lumbar facet syndrome (Bilateral) 12/23/2019   Wedge compression fracture of T12 vertebra, sequela 12/23/2019   DDD (degenerative disc disease), lumbosacral 12/23/2019   DDD (degenerative disc disease), cervical  12/23/2019   Cervical facet syndrome (Right) 12/23/2019   Cervicalgia 12/23/2019   Chronic neck pain (Right) 12/23/2019   Essential hypertension 12/20/2019   GERD without esophagitis 12/20/2019   Hereditary hemochromatosis 12/20/2019   Impotence 12/20/2019   Insomnia 12/20/2019   Osteopenia 12/20/2019   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 Alda Carpen  REFERRING PROVIDER: Dr. Alda Carpen  REFERRING DIAG:  [phone] (ICD-10-CM) - Spinal stenosis, lumbar region, without neurogenic claudication  R26.81 (ICD-10-CM) - Unsteadiness on feet    Rationale for Evaluation and Treatment: Rehabilitation  THERAPY DIAG:   Abnormality of gait and mobility  Muscle weakness (generalized)  Unsteadiness on feet  Other abnormalities of gait and mobility  Difficulty in walking, not elsewhere classified  Other low back pain  ONSET DATE: 2022  SUBJECTIVE:                                                                                                                                                                                           SUBJECTIVE STATEMENT:    Pt doing fine today. No updates. Still wants to decrease to 1x/week and return to gym more.   PERTINENT HISTORY:  Per most recent PCP Visit note on 08/20/2023: Spinal stenosis of lumbar region without neurogenic claudication. Slightly improved but still symptomatic. Patient with unstable gait and lack of strength. Plan to refer to physical therapy for evaluation   PAIN:  Are you having pain? Typicla bilat shoulder pain, no low back pain  PRECAUTIONS: Fall  WEIGHT BEARING RESTRICTIONS: No  FALLS:  Has patient fallen in last 6  months? No  LIVING ENVIRONMENT: Lives with: lives with their spouse Lives in: House/apartment Stairs: Yes: External: 3 steps; can reach both Has following equipment at home: Single point cane, Walker - 2 wheeled, and Grab bars  OCCUPATION: Retired - 2019.  [employer] and worked at [employer] prior to that.   PLOF: Independent  PATIENT GOALS: Improve my walking  NEXT MD VISIT: Dr. Babara (hematologist)   OBJECTIVE:  Note: Objective measures were completed at evaluation unless otherwise noted.  TREATMENT DATE: 10/21/2023 -overground AMB 1077ft , no device, 1.5lb AW bilat, 0 Rt foot scuffs, 44m42sec  Seated recovery  -MODI survey:  26% -ABCScale survey: 86.9% -AMB to wellzone -seated row with neutral grip 2 plates x4, then 1 plate x 1 -Leg press x10 @ plate 7 (does 84k instead) -standing cable row 5lbs  1x15 -discussed STS from weight bench v STS from chair + weight, will try next visit.    PATIENT EDUCATION:  Education details: exercise modification to avoid pain in shoulders.  Person educated: Patient Education method: Explanation, Demonstration, Tactile cues, Verbal cues, and Handouts Education comprehension: verbalized understanding, returned demonstration, verbal cues required, tactile cues required, and needs further education  HOME EXERCISE PROGRAM: Access Code: [email or web address] URL: [email or web address] Date: 08/26/2023 Prepared by: Reyes London  Exercises - Seated Lumbar Flexion Stretch  - 1 x daily - 3 sets - 30 hold - Seated Hamstring Stretch  - 1 x daily - 3 sets - 30 hold  Access Code: [email or web address] URL: [email or web address] Date: 08/28/2023 Prepared by: Massie Dollar  Exercises - Supine Lower Trunk Rotation  - 1 x daily - 7 x weekly - 3 sets - 10 reps - Supine Bridge  - 1 x daily - 7 x weekly - 3 sets - 10 reps - Supine Piriformis Stretch with Foot on Ground  - 1 x daily - 7 x weekly - 3 sets - 10 reps - Supine Figure  4 Piriformis Stretch  - 1 x daily - 7 x weekly - 3 sets - 10 reps  ASSESSMENT:  CLINICAL IMPRESSION:   Retested outcome measure surverys, excellent progress on balance measure and back pain measure unchanged, although pt feels he manages this pain better. Pt oriented to gym based activities as he begings this transition back into the gym. Pt will continue to benefit from skilled physical therapy intervention to address impairments, improve QOL, and attain therapy goals.   OBJECTIVE IMPAIRMENTS: Abnormal gait, decreased activity tolerance, decreased balance, decreased coordination, decreased endurance, decreased mobility, difficulty walking, decreased ROM, decreased strength, hypomobility, postural dysfunction, and pain.   ACTIVITY LIMITATIONS: carrying, lifting, bending, sitting, standing, squatting, sleeping, stairs, transfers, and bed mobility  PARTICIPATION LIMITATIONS: meal prep, cleaning, laundry, shopping, community activity, and yard work  PERSONAL FACTORS: Age, Time since onset of injury/illness/exacerbation, and 3+ comorbidities: OA, DDD  Previous surgeries,  are also affecting patient's functional outcome.   REHAB POTENTIAL: Good  CLINICAL DECISION MAKING: Evolving/moderate complexity  EVALUATION COMPLEXITY: Moderate   GOALS: Goals reviewed with patient? Yes SHORT TERM GOALS: Target date: 10/07/2023  Pt will be independent with HEP in order to improve strength and decrease back pain in order to improve pain-free function at home. Baseline: EVAL= No formal HEP in place Goal status: MET  LONG TERM GOALS: Target date: 11/18/2023  Pt will decrease MODI score by at least 8 points in order demonstrate clinically significant reduction in back pain/disability. Baseline: EVAL = 24; 10/21/23; 26 Goal status: NOT MET, but patient reports he is more successfully managing it.   2.  Pt will decrease worst back pain as reported on NPRS by at least 2 points in order to demonstrate  clinically significant reduction in back pain.  Baseline: Worst low back pain=6/10; 10/21/23: 4/10  Goal status: MET  3.  Pt will improve BERG by at least 3 points in order to demonstrate clinically significant improvement in  balance.  Baseline: 47/56; 09/20/23: 52/56 Goal status: MET 09/30/23  4.  Pt will improve ABC by at least 13% in order to demonstrate clinically significant improvement in balance confidence Baseline: EVAL= 58.75%; 10/21/23: 86.9% Goal status: INITIAL  5.  Pt will decrease 5TSTS by at least 3 seconds in order to demonstrate clinically significant improvement in LE strength. Baseline: 14.07 sec without UE; 09/30/23: 8.15sec Goal status: MET 09/30/23  6.  Pt will increase by at least 38m (121ft) in order to demonstrate clinically significant improvement in cardiopulmonary endurance and community ambulation  Baseline: 870ft with no AD; 09/30/23: 1025 no device  Goal status: MET 9/29  PLAN:  PT FREQUENCY: 1-2x/week  PT DURATION: 12 weeks  PLANNED INTERVENTIONS: 97164- PT Re-evaluation, 97750- Physical Performance Testing, 97110-Therapeutic exercises, 97530- Therapeutic activity, 97112- Neuromuscular re-education, 97535- Self Care, 02859- Manual therapy, [id number]- Gait training, [id number]- Orthotic Initial, [id number]- Orthotic/Prosthetic subsequent, [phone]- Canalith repositioning, [id number]- Electrical stimulation (manual), [phone] (1-2 muscles), 20561 (3+ muscles)- Dry Needling, Patient/Family education, Balance training, Stair training, Taping, Joint mobilization, Joint manipulation, Spinal manipulation, Spinal mobilization, Vestibular training, DME instructions, Cryotherapy, and Moist heat.  PLAN FOR NEXT SESSION:   Give HEP for fitness center Decrease frequency to 1x/week Repeat ABC scale, MODI, LTG2  Peggye JAYSON Linear PT ,DPT Physical Therapist- [facility]   11:16 AM 10/21/23   11:16 AM, 10/21/23 Peggye JAYSON Linear, PT, DPT Physical  Therapist - [facility]  Outpatient Physical Therapy- Main Campus [phone]

## 2023-10-23 ENCOUNTER — Ambulatory Visit: Admitting: Physical Therapy

## 2023-10-24 ENCOUNTER — Ambulatory Visit: Admitting: Physical Therapy

## 2023-10-28 ENCOUNTER — Ambulatory Visit: Admitting: Physical Therapy

## 2023-10-28 DIAGNOSIS — M6281 Muscle weakness (generalized): Secondary | ICD-10-CM

## 2023-10-28 DIAGNOSIS — R2681 Unsteadiness on feet: Secondary | ICD-10-CM

## 2023-10-28 DIAGNOSIS — R269 Unspecified abnormalities of gait and mobility: Secondary | ICD-10-CM | POA: Diagnosis not present

## 2023-10-28 DIAGNOSIS — R2689 Other abnormalities of gait and mobility: Secondary | ICD-10-CM

## 2023-10-28 DIAGNOSIS — R262 Difficulty in walking, not elsewhere classified: Secondary | ICD-10-CM

## 2023-10-28 NOTE — Therapy (Signed)
 OUTPATIENT PHYSICAL THERAPY TREATMENT  Patient Name: Carlos Neal. MRN: [id number] DOB:12/14/1940, 83 y.o., male Today's Date: 10/28/2023  END OF SESSION:    PT End of Session - 10/28/23 1126     Visit Number 17    Number of Visits 24    Date for Recertification  11/18/23    Authorization Type [employer]    Progress Note Due on Visit 20    PT Start Time 1145    PT Stop Time 1225    PT Time Calculation (min) 40 min    Equipment Utilized During Treatment Gait belt    Activity Tolerance Patient tolerated treatment well    Behavior During Therapy WFL for tasks assessed/performed            Past Medical History:  Diagnosis Date   Anemia    Asthma    as a child   GERD (gastroesophageal reflux disease)    Hypertension    Osteoarthritis    back and knee   Osteopenia    Osteoporosis    Vitamin D  deficiency    Past Surgical History:  Procedure Laterality Date   BROW LIFT Bilateral 09/23/2020   Procedure: BLEPHAROPTOSIS REPAIR; RESECT EX  BROW PTOSIS REPAIR ECTROPION REPAIR, EXTENSIVE BILATERAL;  Surgeon: Ashley Greig HERO, MD;  Location: [facility];  Service: Ophthalmology;  Laterality: Bilateral;  wants to be later in the schedule   COLONOSCOPY  01/12/2013   COLONOSCOPY N/A 01/05/2022   Procedure: COLONOSCOPY;  Surgeon: Toledo, Ladell POUR, MD;  Location: [facility];  Service: Gastroenterology;  Laterality: N/A;   ESOPHAGOGASTRODUODENOSCOPY (EGD) WITH PROPOFOL  N/A 01/05/2022   Procedure: ESOPHAGOGASTRODUODENOSCOPY (EGD) WITH PROPOFOL ;  Surgeon: Toledo, Ladell POUR, MD;  Location: [facility];  Service: Gastroenterology;  Laterality: N/A;   GIVENS CAPSULE STUDY N/A 01/05/2022   Procedure: GIVENS CAPSULE STUDY;  Surgeon: Toledo, Ladell POUR, MD;  Location: [facility];  Service: Gastroenterology;  Laterality: N/A;   HYDROCELE EXCISION     age 26   INGUINAL HERNIA REPAIR Bilateral    as a child   KNEE ARTHROPLASTY Right 01/30/2021   Procedure: COMPUTER  ASSISTED TOTAL KNEE ARTHROPLASTY;  Surgeon: Mardee Lynwood SQUIBB, MD;  Location: [facility];  Service: Orthopedics;  Laterality: Right;   LUMBAR LAMINECTOMY/ DECOMPRESSION WITH MET-RX Right 01/15/2020   Procedure: L2-3 DECOMPRESSION, RIGHT L2-3 FAR LATERAL DISCECTOMY;  Surgeon: Clois Fret, MD;  Location: [facility];  Service: Neurosurgery;  Laterality: Right;  2nd case   TOTAL HIP ARTHROPLASTY Left 12/04/2021   Procedure: TOTAL HIP ARTHROPLASTY;  Surgeon: Mardee Lynwood SQUIBB, MD;  Location: [facility];  Service: Orthopedics;  Laterality: Left;   Patient Active Problem List   Diagnosis Date Noted   Senile purpura 09/05/2023   IDA (iron  deficiency anemia) 01/11/2022   AVM (arteriovenous malformation) 01/11/2022   Symptomatic anemia 01/03/2022   Positive D dimer 01/03/2022   H/O total hip arthroplasty, left 12/04/21 12/04/2021   Chronic left shoulder pain 11/09/2021   Primary osteoarthritis of left hip 08/22/2021   Status post total right knee replacement 01/30/2021   B12 deficiency 03/16/2020   BPH associated with nocturia 03/16/2020   Chronic pain syndrome 12/23/2019   Pharmacologic therapy 12/23/2019   Disorder of skeletal system 12/23/2019   Problems influencing health status 12/23/2019   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HCC) 12/23/2019   Chronic lower extremity pain (1ry area of Pain) (Right) 12/23/2019   Chronic low back pain (2ry area of Pain) (Bilateral) (R>L) w/ sciatica (Right) 12/23/2019   Abnormal MRI,  lumbar spine (12/04/2019) 12/23/2019   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) 12/23/2019   Retrolisthesis (T12-L1, L1-2) 12/23/2019   Lumbosacral foraminal stenosis 12/23/2019   Lumbar facet arthropathy 12/23/2019   Osteoarthritis of facet joint of lumbar spine 12/23/2019   Lumbar facet syndrome (Bilateral) 12/23/2019   Wedge compression fracture of T12 vertebra, sequela 12/23/2019   DDD (degenerative disc disease), lumbosacral 12/23/2019   DDD (degenerative disc disease), cervical  12/23/2019   Cervical facet syndrome (Right) 12/23/2019   Cervicalgia 12/23/2019   Chronic neck pain (Right) 12/23/2019   Essential hypertension 12/20/2019   GERD without esophagitis 12/20/2019   Hereditary hemochromatosis 12/20/2019   Impotence 12/20/2019   Insomnia 12/20/2019   Osteopenia 12/20/2019   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 Alda Carpen  REFERRING PROVIDER: Dr. Alda Carpen  REFERRING DIAG:  [id number] (ICD-10-CM) - Spinal stenosis, lumbar region, without neurogenic claudication  R26.81 (ICD-10-CM) - Unsteadiness on feet    Rationale for Evaluation and Treatment: Rehabilitation  THERAPY DIAG:   No diagnosis found.  ONSET DATE: 2022  SUBJECTIVE:                                                                                                                                                                                           SUBJECTIVE STATEMENT:     Pt doing fine today. No updates. Did not ride to play golf this weekend. Went to gym since last appointment.   PERTINENT HISTORY:  Per most recent PCP Visit note on 08/20/2023: Spinal stenosis of lumbar region without neurogenic claudication. Slightly improved but still symptomatic. Patient with unstable gait and lack of strength. Plan to refer to physical therapy for evaluation   PAIN:  Are you having pain? Typicla bilat shoulder pain, no low back pain  PRECAUTIONS: Fall  WEIGHT BEARING RESTRICTIONS: No  FALLS:  Has patient fallen in last 6 months? No  LIVING ENVIRONMENT: Lives with: lives with their spouse Lives in: House/apartment Stairs: Yes: External: 3 steps; can reach both Has following equipment at  home: Single point cane, Walker - 2 wheeled, and Grab bars  OCCUPATION: Retired - 2019.  [employer] and worked at [employer] prior to that.   PLOF: Independent  PATIENT GOALS: Improve my walking  NEXT MD VISIT: Dr. Babara (hematologist)   OBJECTIVE:  Note: Objective measures were completed at evaluation unless otherwise noted.  TREATMENT DATE: 10/28/2023 TE-  To improve strength, endurance, mobility, and function of specific targeted muscle groups or improve joint range of motion or improve muscle flexibility  -Nustep rolling hills level 3-7 x 8 min for LE reciprocal movement training.  -AMB to wellzone - Full Leg Press  3 sets - 10 reps-Level 6  - Single Leg Press  - - 2 sets - 10 reps Level 3 - Hamstring Curl with Weight Machine - 3 sets - 10 reps Level 4  - Knee Extension with Weight Machine  -- 3 sets - 10 reps level 2  NMR: To facilitate reeducation of movement, balance, posture, coordination, and/or proprioception/kinesthetic sense.  Bosu soft side stance 3  30 sec  BOSU hard side squat 2 x 10 with UE support  BOSU hard side stance x 30 sec- sign LE trembling noted, difficulty establishing equilibrium    PATIENT EDUCATION:  Education details: exercise modification to avoid pain in shoulders.  Person educated: Patient Education method: Explanation, Demonstration, Tactile cues, Verbal cues, and Handouts Education comprehension: verbalized understanding, returned demonstration, verbal cues required, tactile cues required, and needs further education  HOME EXERCISE PROGRAM:  Access Code: [email or web address] URL: [email or web address] Date: 10/28/2023 Prepared by: Lonni Gainer  Exercises - Nustep level 4-5 x 15 minutes   - 1 x daily - 3 x weekly - Full Leg Press  - 1 x daily - 3 x weekly - 3 sets - 10 reps - Single Leg Press  - 1 x daily - 3 x weekly - 2 sets - 10 reps - Hamstring Curl with Weight Machine  - 1 x daily - 3 x weekly - 3 sets - 10 reps -  Knee Extension with Weight Machine  - 1 x daily - 3 x weekly - 3 sets - 10 reps  Access Code: [email or web address] URL: [email or web address] Date: 08/26/2023 Prepared by: Reyes London  Exercises - Seated Lumbar Flexion Stretch  - 1 x daily - 3 sets - 30 hold - Seated Hamstring Stretch  - 1 x daily - 3 sets - 30 hold  Access Code: [email or web address] URL: [email or web address] Date: 08/28/2023 Prepared by: Massie Dollar  Exercises - Supine Lower Trunk Rotation  - 1 x daily - 7 x weekly - 3 sets - 10 reps - Supine Bridge  - 1 x daily - 7 x weekly - 3 sets - 10 reps - Supine Piriformis Stretch with Foot on Ground  - 1 x daily - 7 x weekly - 3 sets - 10 reps - Supine Figure 4 Piriformis Stretch  - 1 x daily - 7 x weekly - 3 sets - 10 reps  ASSESSMENT:  CLINICAL IMPRESSION:    Patient arrived with good motivation for completion of pt activities. Progressed with well zone based activities and provided pt with updated HEP to complete at [place] gym [place]/ [place]. Will continue to work on balance and gait as appropriate leading up to discharge. Pt will continue to benefit from skilled physical therapy intervention to address impairments, improve QOL, and attain therapy goals.    OBJECTIVE IMPAIRMENTS: Abnormal gait, decreased activity tolerance, decreased balance, decreased coordination, decreased endurance, decreased mobility, difficulty walking, decreased ROM, decreased strength, hypomobility, postural dysfunction, and pain.   ACTIVITY LIMITATIONS: carrying, lifting, bending, sitting, standing, squatting, sleeping, stairs, transfers, and bed mobility  PARTICIPATION LIMITATIONS: meal prep, cleaning, laundry, shopping, community activity, and yard work  PERSONAL FACTORS: Age, Time since onset of injury/illness/exacerbation, and 3+ comorbidities: OA, DDD  Previous surgeries,  are also affecting patient's functional  outcome.   REHAB POTENTIAL: Good  CLINICAL  DECISION MAKING: Evolving/moderate complexity  EVALUATION COMPLEXITY: Moderate   GOALS: Goals reviewed with patient? Yes SHORT TERM GOALS: Target date: 10/07/2023  Pt will be independent with HEP in order to improve strength and decrease back pain in order to improve pain-free function at home. Baseline: EVAL= No formal HEP in place Goal status: MET  LONG TERM GOALS: Target date: 11/18/2023  Pt will decrease MODI score by at least 8 points in order demonstrate clinically significant reduction in back pain/disability. Baseline: EVAL = 24; 10/21/23; 26 Goal status: NOT MET, but patient reports he is more successfully managing it.   2.  Pt will decrease worst back pain as reported on NPRS by at least 2 points in order to demonstrate clinically significant reduction in back pain.  Baseline: Worst low back pain=6/10; 10/21/23: 4/10  Goal status: MET  3.  Pt will improve BERG by at least 3 points in order to demonstrate clinically significant improvement in balance.  Baseline: 47/56; 09/20/23: 52/56 Goal status: MET 09/30/23  4.  Pt will improve ABC by at least 13% in order to demonstrate clinically significant improvement in balance confidence Baseline: EVAL= 58.75%; 10/21/23: 86.9% Goal status: INITIAL  5.  Pt will decrease 5TSTS by at least 3 seconds in order to demonstrate clinically significant improvement in LE strength. Baseline: 14.07 sec without UE; 09/30/23: 8.15sec Goal status: MET 09/30/23  6.  Pt will increase by at least 41m (163ft) in order to demonstrate clinically significant improvement in cardiopulmonary endurance and community ambulation  Baseline: 883ft with no AD; 09/30/23: 1025 no device  Goal status: MET 9/29  PLAN:  PT FREQUENCY: 1-2x/week  PT DURATION: 12 weeks  PLANNED INTERVENTIONS: 97164- PT Re-evaluation, 97750- Physical Performance Testing, 97110-Therapeutic exercises, 97530- Therapeutic activity, 97112- Neuromuscular re-education, 97535- Self  Care, 02859- Manual therapy, [id number]- Gait training, [id number]- Orthotic Initial, [id number]- Orthotic/Prosthetic subsequent, [phone]- Canalith repositioning, [id number]- Electrical stimulation (manual), [phone] (1-2 muscles), 20561 (3+ muscles)- Dry Needling, Patient/Family education, Balance training, Stair training, Taping, Joint mobilization, Joint manipulation, Spinal manipulation, Spinal mobilization, Vestibular training, DME instructions, Cryotherapy, and Moist heat.  PLAN FOR NEXT SESSION:     MODI  Note: Portions of this document were prepared using Dragon voice recognition software and although reviewed may contain unintentional dictation errors in syntax, grammar, or spelling.  Lonni KATHEE Gainer PT ,DPT Physical Therapist- [facility]    11:26 AM 10/28/23   11:26 AM, 10/28/23

## 2023-10-30 ENCOUNTER — Ambulatory Visit: Admitting: Physical Therapy

## 2023-11-01 ENCOUNTER — Ambulatory Visit: Admitting: Physical Therapy

## 2023-11-04 ENCOUNTER — Ambulatory Visit: Attending: Family Medicine | Admitting: Physical Therapy

## 2023-11-04 DIAGNOSIS — R2689 Other abnormalities of gait and mobility: Secondary | ICD-10-CM | POA: Diagnosis present

## 2023-11-04 DIAGNOSIS — M5459 Other low back pain: Secondary | ICD-10-CM | POA: Insufficient documentation

## 2023-11-04 DIAGNOSIS — M6281 Muscle weakness (generalized): Secondary | ICD-10-CM | POA: Insufficient documentation

## 2023-11-04 DIAGNOSIS — R269 Unspecified abnormalities of gait and mobility: Secondary | ICD-10-CM | POA: Diagnosis present

## 2023-11-04 DIAGNOSIS — R262 Difficulty in walking, not elsewhere classified: Secondary | ICD-10-CM | POA: Insufficient documentation

## 2023-11-04 DIAGNOSIS — R2681 Unsteadiness on feet: Secondary | ICD-10-CM | POA: Diagnosis present

## 2023-11-04 NOTE — Therapy (Signed)
 OUTPATIENT PHYSICAL THERAPY TREATMENT  Patient Name: Carlos Neal. MRN: [id number] DOB:1940/09/07, 83 y.o., male Today's Date: 11/04/2023  END OF SESSION:    PT End of Session - 11/04/23 1206     Visit Number 18    Number of Visits 24    Date for Recertification  11/18/23    Authorization Type [employer]    Progress Note Due on Visit 20    PT Start Time 1145    PT Stop Time 1225    PT Time Calculation (min) 40 min    Equipment Utilized During Treatment Gait belt    Activity Tolerance Patient tolerated treatment well    Behavior During Therapy WFL for tasks assessed/performed             Past Medical History:  Diagnosis Date   Anemia    Asthma    as a child   GERD (gastroesophageal reflux disease)    Hypertension    Osteoarthritis    back and knee   Osteopenia    Osteoporosis    Vitamin D  deficiency    Past Surgical History:  Procedure Laterality Date   BROW LIFT Bilateral 09/23/2020   Procedure: BLEPHAROPTOSIS REPAIR; RESECT EX  BROW PTOSIS REPAIR ECTROPION REPAIR, EXTENSIVE BILATERAL;  Surgeon: Ashley Greig HERO, MD;  Location: [facility];  Service: Ophthalmology;  Laterality: Bilateral;  wants to be later in the schedule   COLONOSCOPY  01/12/2013   COLONOSCOPY N/A 01/05/2022   Procedure: COLONOSCOPY;  Surgeon: Toledo, Ladell POUR, MD;  Location: [facility];  Service: Gastroenterology;  Laterality: N/A;   ESOPHAGOGASTRODUODENOSCOPY (EGD) WITH PROPOFOL  N/A 01/05/2022   Procedure: ESOPHAGOGASTRODUODENOSCOPY (EGD) WITH PROPOFOL ;  Surgeon: Toledo, Ladell POUR, MD;  Location: [facility];  Service: Gastroenterology;  Laterality: N/A;   GIVENS CAPSULE STUDY N/A 01/05/2022   Procedure: GIVENS CAPSULE STUDY;  Surgeon: Toledo, Ladell POUR, MD;  Location: [facility];  Service: Gastroenterology;  Laterality: N/A;   HYDROCELE EXCISION     age 5   INGUINAL HERNIA REPAIR Bilateral    as a child   KNEE ARTHROPLASTY Right 01/30/2021   Procedure: COMPUTER  ASSISTED TOTAL KNEE ARTHROPLASTY;  Surgeon: Mardee Lynwood SQUIBB, MD;  Location: [facility];  Service: Orthopedics;  Laterality: Right;   LUMBAR LAMINECTOMY/ DECOMPRESSION WITH MET-RX Right 01/15/2020   Procedure: L2-3 DECOMPRESSION, RIGHT L2-3 FAR LATERAL DISCECTOMY;  Surgeon: Clois Fret, MD;  Location: [facility];  Service: Neurosurgery;  Laterality: Right;  2nd case   TOTAL HIP ARTHROPLASTY Left 12/04/2021   Procedure: TOTAL HIP ARTHROPLASTY;  Surgeon: Mardee Lynwood SQUIBB, MD;  Location: [facility];  Service: Orthopedics;  Laterality: Left;   Patient Active Problem List   Diagnosis Date Noted   Senile purpura 09/05/2023   IDA (iron  deficiency anemia) 01/11/2022   AVM (arteriovenous malformation) 01/11/2022   Symptomatic anemia 01/03/2022   Positive D dimer 01/03/2022   H/O total hip arthroplasty, left 12/04/21 12/04/2021   Chronic left shoulder pain 11/09/2021   Primary osteoarthritis of left hip 08/22/2021   Status post total right knee replacement 01/30/2021   B12 deficiency 03/16/2020   BPH associated with nocturia 03/16/2020   Chronic pain syndrome 12/23/2019   Pharmacologic therapy 12/23/2019   Disorder of skeletal system 12/23/2019   Problems influencing health status 12/23/2019   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HCC) 12/23/2019   Chronic lower extremity pain (1ry area of Pain) (Right) 12/23/2019   Chronic low back pain (2ry area of Pain) (Bilateral) (R>L) w/ sciatica (Right) 12/23/2019   Abnormal  MRI, lumbar spine (12/04/2019) 12/23/2019   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) 12/23/2019   Retrolisthesis (T12-L1, L1-2) 12/23/2019   Lumbosacral foraminal stenosis 12/23/2019   Lumbar facet arthropathy 12/23/2019   Osteoarthritis of facet joint of lumbar spine 12/23/2019   Lumbar facet syndrome (Bilateral) 12/23/2019   Wedge compression fracture of T12 vertebra, sequela 12/23/2019   DDD (degenerative disc disease), lumbosacral 12/23/2019   DDD (degenerative disc disease), cervical  12/23/2019   Cervical facet syndrome (Right) 12/23/2019   Cervicalgia 12/23/2019   Chronic neck pain (Right) 12/23/2019   Essential hypertension 12/20/2019   GERD without esophagitis 12/20/2019   Hereditary hemochromatosis 12/20/2019   Impotence 12/20/2019   Insomnia 12/20/2019   Osteopenia 12/20/2019   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 Alda Carpen  REFERRING PROVIDER: Dr. Alda Carpen  REFERRING DIAG:  [phone] (ICD-10-CM) - Spinal stenosis, lumbar region, without neurogenic claudication  R26.81 (ICD-10-CM) - Unsteadiness on feet    Rationale for Evaluation and Treatment: Rehabilitation  THERAPY DIAG:   No diagnosis found.  ONSET DATE: 2022  SUBJECTIVE:                                                                                                                                                                                           SUBJECTIVE STATEMENT:     Pt doing fine today. No updates. Did not ride to play golf this weekend. Went to gym since last appointment.   PERTINENT HISTORY:  Per most recent PCP Visit note on 08/20/2023: Spinal stenosis of lumbar region without neurogenic claudication. Slightly improved but still symptomatic. Patient with unstable gait and lack of strength. Plan to refer to physical therapy for evaluation   PAIN:  Are you having pain? Typicla bilat shoulder pain, no low back pain  PRECAUTIONS: Fall  WEIGHT BEARING RESTRICTIONS: No  FALLS:  Has patient fallen in last 6 months? No  LIVING ENVIRONMENT: Lives with: lives with their spouse Lives in: House/apartment Stairs: Yes: External: 3 steps; can reach both Has following equipment at  home: Single point cane, Walker - 2 wheeled, and Grab bars  OCCUPATION: Retired - 2019.  [employer] and worked at [employer] prior to that.   PLOF: Independent  PATIENT GOALS: Improve my walking  NEXT MD VISIT: Dr. Babara (hematologist)   OBJECTIVE:  Note: Objective measures were completed at evaluation unless otherwise noted.  TREATMENT DATE: 11/04/2023  TE- To improve strength, endurance, mobility, and function of specific targeted muscle groups or improve joint range of motion or improve muscle flexibility  AMB to [employer] row machine 2 x 10 reps with level 2 and cues not to push into shoulder pain  Back to clinic gym  Leg press 2 x 10 at 40# and 2 x 10 at 55#   TA- To improve functional movements patterns for everyday tasks   Standing on incline board 3 x 12 reps STS with green band around knees x 10  Sidestepping in front of mat table x 5 laps with GTB  Standing on incline board 3 x 12 reps STS with green band around knees x 10  Sidestepping in front of mat table x 5 laps with GTB  -Nustep double  hills level 3-7 x 8 min for LE reciprocal movement training. - Min Lue movement and uses R UE throughout,   NMR: To facilitate reeducation of movement, balance, posture, coordination, and/or proprioception/kinesthetic sense.  Bosu hard  side stance 3 x  30 sec  BOSU hard side squat 2 x 10 with UE support     PATIENT EDUCATION:  Education details: exercise modification to avoid pain in shoulders.  Person educated: Patient Education method: Explanation, Demonstration, Tactile cues, Verbal cues, and Handouts Education comprehension: verbalized understanding, returned demonstration, verbal cues required, tactile cues required, and needs further education  HOME EXERCISE PROGRAM:  Access Code: [email or web address] URL: [email or web address] Date: 10/28/2023 Prepared by: Lonni Gainer  Exercises - Nustep level 4-5 x 15 minutes   - 1 x daily - 3 x  weekly - Full Leg Press  - 1 x daily - 3 x weekly - 3 sets - 10 reps - Single Leg Press  - 1 x daily - 3 x weekly - 2 sets - 10 reps - Hamstring Curl with Weight Machine  - 1 x daily - 3 x weekly - 3 sets - 10 reps - Knee Extension with Weight Machine  - 1 x daily - 3 x weekly - 3 sets - 10 reps  Access Code: [email or web address] URL: [email or web address] Date: 08/26/2023 Prepared by: Reyes London  Exercises - Seated Lumbar Flexion Stretch  - 1 x daily - 3 sets - 30 hold - Seated Hamstring Stretch  - 1 x daily - 3 sets - 30 hold  Access Code: [email or web address] URL: [email or web address] Date: 08/28/2023 Prepared by: Massie Dollar  Exercises - Supine Lower Trunk Rotation  - 1 x daily - 7 x weekly - 3 sets - 10 reps - Supine Bridge  - 1 x daily - 7 x weekly - 3 sets - 10 reps - Supine Piriformis Stretch with Foot on Ground  - 1 x daily - 7 x weekly - 3 sets - 10 reps - Supine Figure 4 Piriformis Stretch  - 1 x daily - 7 x weekly - 3 sets - 10 reps  ASSESSMENT:  CLINICAL IMPRESSION:    Patient arrived with good motivation for completion of pt activities. Continued with higher level strength and balance activities and provided further instruction for gym based exercise independently. Pt will continue to benefit from skilled physical therapy intervention to address impairments, improve QOL, and attain therapy goals.   OBJECTIVE IMPAIRMENTS: Abnormal gait, decreased activity tolerance, decreased balance, decreased coordination, decreased endurance, decreased mobility, difficulty walking, decreased ROM, decreased strength, hypomobility, postural dysfunction, and pain.   ACTIVITY LIMITATIONS: carrying, lifting, bending, sitting, standing, squatting, sleeping, stairs, transfers, and bed mobility  PARTICIPATION LIMITATIONS: meal prep,  cleaning, laundry, shopping, community activity, and yard work  PERSONAL FACTORS: Age, Time since onset of injury/illness/exacerbation, and 3+  comorbidities: OA, DDD  Previous surgeries,  are also affecting patient's functional outcome.   REHAB POTENTIAL: Good  CLINICAL DECISION MAKING: Evolving/moderate complexity  EVALUATION COMPLEXITY: Moderate   GOALS: Goals reviewed with patient? Yes SHORT TERM GOALS: Target date: 10/07/2023  Pt will be independent with HEP in order to improve strength and decrease back pain in order to improve pain-free function at home. Baseline: EVAL= No formal HEP in place Goal status: MET  LONG TERM GOALS: Target date: 11/18/2023  Pt will decrease MODI score by at least 8 points in order demonstrate clinically significant reduction in back pain/disability. Baseline: EVAL = 24; 10/21/23; 26 Goal status: NOT MET, but patient reports he is more successfully managing it.   2.  Pt will decrease worst back pain as reported on NPRS by at least 2 points in order to demonstrate clinically significant reduction in back pain.  Baseline: Worst low back pain=6/10; 10/21/23: 4/10  Goal status: MET  3.  Pt will improve BERG by at least 3 points in order to demonstrate clinically significant improvement in balance.  Baseline: 47/56; 09/20/23: 52/56 Goal status: MET 09/30/23  4.  Pt will improve ABC by at least 13% in order to demonstrate clinically significant improvement in balance confidence Baseline: EVAL= 58.75%; 10/21/23: 86.9% Goal status: MET  5.  Pt will decrease 5TSTS by at least 3 seconds in order to demonstrate clinically significant improvement in LE strength. Baseline: 14.07 sec without UE; 09/30/23: 8.15sec Goal status: MET 09/30/23  6.  Pt will increase by at least 27m (125ft) in order to demonstrate clinically significant improvement in cardiopulmonary endurance and community ambulation  Baseline: 814ft with no AD; 09/30/23: 1025 no device  Goal status: MET 9/29  PLAN:  PT FREQUENCY: 1-2x/week  PT DURATION: 12 weeks  PLANNED INTERVENTIONS: [id number]- PT Re-evaluation, 97750- Physical  Performance Testing, 97110-Therapeutic exercises, 97530- Therapeutic activity, 97112- Neuromuscular re-education, 97535- Self Care, [id number]- Manual therapy, [id number]- Gait training, [id number]- Orthotic Initial, [id number]- Orthotic/Prosthetic subsequent, [phone]- Canalith repositioning, [id number]- Electrical stimulation (manual), [phone] (1-2 muscles), [id number] (3+ muscles)- Dry Needling, Patient/Family education, Balance training, Stair training, Taping, Joint mobilization, Joint manipulation, Spinal manipulation, Spinal mobilization, Vestibular training, DME instructions, Cryotherapy, and Moist heat.  PLAN FOR NEXT SESSION:     MODI  Note: Portions of this document were prepared using Dragon voice recognition software and although reviewed may contain unintentional dictation errors in syntax, grammar, or spelling.  Lonni KATHEE Gainer PT ,DPT Physical Therapist- [facility]    12:07 PM 11/04/23   12:07 PM, 11/04/23

## 2023-11-06 ENCOUNTER — Ambulatory Visit: Admitting: Physical Therapy

## 2023-11-07 ENCOUNTER — Ambulatory Visit: Admitting: Physical Therapy

## 2023-11-11 ENCOUNTER — Ambulatory Visit: Admitting: Physical Therapy

## 2023-11-11 DIAGNOSIS — M6281 Muscle weakness (generalized): Secondary | ICD-10-CM

## 2023-11-11 DIAGNOSIS — R269 Unspecified abnormalities of gait and mobility: Secondary | ICD-10-CM

## 2023-11-11 NOTE — Therapy (Signed)
 OUTPATIENT PHYSICAL THERAPY TREATMENT  Patient Name: Carlos Neal. MRN: [id number] DOB:28-Jun-1940, 83 y.o., male Today's Date: 11/11/2023  END OF SESSION:    PT End of Session - 11/11/23 1406     Visit Number 19    Number of Visits 24    Date for Recertification  11/18/23    Authorization Type [employer]    Progress Note Due on Visit 20    PT Start Time 1105    PT Stop Time 1143    PT Time Calculation (min) 38 min    Equipment Utilized During Treatment Gait belt    Activity Tolerance Patient tolerated treatment well    Behavior During Therapy WFL for tasks assessed/performed              Past Medical History:  Diagnosis Date   Anemia    Asthma    as a child   GERD (gastroesophageal reflux disease)    Hypertension    Osteoarthritis    back and knee   Osteopenia    Osteoporosis    Vitamin D  deficiency    Past Surgical History:  Procedure Laterality Date   BROW LIFT Bilateral 09/23/2020   Procedure: BLEPHAROPTOSIS REPAIR; RESECT EX  BROW PTOSIS REPAIR ECTROPION REPAIR, EXTENSIVE BILATERAL;  Surgeon: Ashley Greig HERO, MD;  Location: [facility];  Service: Ophthalmology;  Laterality: Bilateral;  wants to be later in the schedule   COLONOSCOPY  01/12/2013   COLONOSCOPY N/A 01/05/2022   Procedure: COLONOSCOPY;  Surgeon: Toledo, Ladell POUR, MD;  Location: [facility];  Service: Gastroenterology;  Laterality: N/A;   ESOPHAGOGASTRODUODENOSCOPY (EGD) WITH PROPOFOL  N/A 01/05/2022   Procedure: ESOPHAGOGASTRODUODENOSCOPY (EGD) WITH PROPOFOL ;  Surgeon: Toledo, Ladell POUR, MD;  Location: [facility];  Service: Gastroenterology;  Laterality: N/A;   GIVENS CAPSULE STUDY N/A 01/05/2022   Procedure: GIVENS CAPSULE STUDY;  Surgeon: Toledo, Ladell POUR, MD;  Location: [facility];  Service: Gastroenterology;  Laterality: N/A;   HYDROCELE EXCISION     age 11   INGUINAL HERNIA REPAIR Bilateral    as a child   KNEE ARTHROPLASTY Right 01/30/2021   Procedure: COMPUTER  ASSISTED TOTAL KNEE ARTHROPLASTY;  Surgeon: Mardee Lynwood SQUIBB, MD;  Location: [facility];  Service: Orthopedics;  Laterality: Right;   LUMBAR LAMINECTOMY/ DECOMPRESSION WITH MET-RX Right 01/15/2020   Procedure: L2-3 DECOMPRESSION, RIGHT L2-3 FAR LATERAL DISCECTOMY;  Surgeon: Clois Fret, MD;  Location: [facility];  Service: Neurosurgery;  Laterality: Right;  2nd case   TOTAL HIP ARTHROPLASTY Left 12/04/2021   Procedure: TOTAL HIP ARTHROPLASTY;  Surgeon: Mardee Lynwood SQUIBB, MD;  Location: [facility];  Service: Orthopedics;  Laterality: Left;   Patient Active Problem List   Diagnosis Date Noted   Senile purpura 09/05/2023   IDA (iron  deficiency anemia) 01/11/2022   AVM (arteriovenous malformation) 01/11/2022   Symptomatic anemia 01/03/2022   Positive D dimer 01/03/2022   H/O total hip arthroplasty, left 12/04/21 12/04/2021   Chronic left shoulder pain 11/09/2021   Primary osteoarthritis of left hip 08/22/2021   Status post total right knee replacement 01/30/2021   B12 deficiency 03/16/2020   BPH associated with nocturia 03/16/2020   Chronic pain syndrome 12/23/2019   Pharmacologic therapy 12/23/2019   Disorder of skeletal system 12/23/2019   Problems influencing health status 12/23/2019   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HCC) 12/23/2019   Chronic lower extremity pain (1ry area of Pain) (Right) 12/23/2019   Chronic low back pain (2ry area of Pain) (Bilateral) (R>L) w/ sciatica (Right) 12/23/2019  Abnormal MRI, lumbar spine (12/04/2019) 12/23/2019   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) 12/23/2019   Retrolisthesis (T12-L1, L1-2) 12/23/2019   Lumbosacral foraminal stenosis 12/23/2019   Lumbar facet arthropathy 12/23/2019   Osteoarthritis of facet joint of lumbar spine 12/23/2019   Lumbar facet syndrome (Bilateral) 12/23/2019   Wedge compression fracture of T12 vertebra, sequela 12/23/2019   DDD (degenerative disc disease), lumbosacral 12/23/2019   DDD (degenerative disc disease), cervical  12/23/2019   Cervical facet syndrome (Right) 12/23/2019   Cervicalgia 12/23/2019   Chronic neck pain (Right) 12/23/2019   Essential hypertension 12/20/2019   GERD without esophagitis 12/20/2019   Hereditary hemochromatosis 12/20/2019   Impotence 12/20/2019   Insomnia 12/20/2019   Osteopenia 12/20/2019   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 Alda Carpen  REFERRING PROVIDER: Dr. Alda Carpen  REFERRING DIAG:  [phone] (ICD-10-CM) - Spinal stenosis, lumbar region, without neurogenic claudication  R26.81 (ICD-10-CM) - Unsteadiness on feet    Rationale for Evaluation and Treatment: Rehabilitation  THERAPY DIAG:   Abnormality of gait and mobility  Muscle weakness (generalized)  ONSET DATE: 2022  SUBJECTIVE:                                                                                                                                                                                           SUBJECTIVE STATEMENT:    Rode with his son golfing and went to gym over weekend. Feels good about discharge next visit and will present with any questions regarding his home or gym program.   PERTINENT HISTORY:  Per most recent PCP Visit note on 08/20/2023: Spinal stenosis of lumbar region without neurogenic claudication. Slightly improved but still symptomatic. Patient with unstable gait and lack of strength. Plan to refer to physical therapy for evaluation   PAIN:  Are you having pain? Typicla bilat shoulder pain, no low back pain  PRECAUTIONS: Fall  WEIGHT BEARING RESTRICTIONS: No  FALLS:  Has patient fallen in last 6 months? No  LIVING ENVIRONMENT: Lives with: lives with their  spouse Lives in: House/apartment Stairs: Yes: External: 3 steps; can reach both Has following equipment at home: Single point cane, Walker - 2 wheeled, and Grab bars  OCCUPATION: Retired - 2019.  [employer] and worked at [employer] prior to that.   PLOF: Independent  PATIENT GOALS: Improve my walking  NEXT MD VISIT: Dr. Babara (hematologist)   OBJECTIVE:  Note: Objective measures were completed at evaluation unless otherwise noted.  TREATMENT DATE: 11/11/2023 TE- To improve strength, endurance, mobility, and function of specific targeted muscle groups or improve joint range of motion or improve muscle flexibility Nustep LE only x 6 min L 4 ( old nustep)   AMB to [employer] row machine 3x 10 reps with level 2 and cues not to push into shoulder pain Leg press 3 x 12 reps at level 6 plate Knee extension machine level 3, 3 x 10 reps  Knee flexion machine 3 x 10 reps at level 4 resistance   Back to clinic gym  TA- To improve functional movements patterns for everyday tasks   Standing march to step x 10 ea LE, cues for distance from step for optimal foot clearance   Heel raises 3 x 12 reps with UE assistance for balance     PATIENT EDUCATION:  Education details: exercise modification to avoid pain in shoulders.  Person educated: Patient Education method: Explanation, Demonstration, Tactile cues, Verbal cues, and Handouts Education comprehension: verbalized understanding, returned demonstration, verbal cues required, tactile cues required, and needs further education  HOME EXERCISE PROGRAM:  Access Code: [email or web address] URL: [email or web address] Date: 10/28/2023 Prepared by: Lonni Gainer  Exercises - Nustep level 4-5 x 15 minutes   - 1 x daily - 3 x weekly - Full Leg Press  - 1 x daily - 3 x weekly - 3 sets - 10 reps - Single Leg Press  - 1 x daily - 3 x weekly - 2 sets - 10 reps - Hamstring Curl with Weight Machine  - 1 x daily - 3 x weekly  - 3 sets - 10 reps - Knee Extension with Weight Machine  - 1 x daily - 3 x weekly - 3 sets - 10 reps  Access Code: [email or web address] URL: [email or web address] Date: 08/26/2023 Prepared by: Reyes London  Exercises - Seated Lumbar Flexion Stretch  - 1 x daily - 3 sets - 30 hold - Seated Hamstring Stretch  - 1 x daily - 3 sets - 30 hold  Access Code: [email or web address] URL: [email or web address] Date: 08/28/2023 Prepared by: Massie Dollar  Exercises - Supine Lower Trunk Rotation  - 1 x daily - 7 x weekly - 3 sets - 10 reps - Supine Bridge  - 1 x daily - 7 x weekly - 3 sets - 10 reps - Supine Piriformis Stretch with Foot on Ground  - 1 x daily - 7 x weekly - 3 sets - 10 reps - Supine Figure 4 Piriformis Stretch  - 1 x daily - 7 x weekly - 3 sets - 10 reps  ASSESSMENT:  CLINICAL IMPRESSION:    Patient arrived with good motivation for completion of pt activities. Continued with higher level strength for follow up on instruction for gym based exercise independently. Plan to discharge with advanced gym program and HEP in place. Pt will continue to benefit from skilled physical therapy intervention to address impairments, improve QOL, and attain therapy goals.   OBJECTIVE IMPAIRMENTS: Abnormal gait, decreased activity tolerance, decreased balance, decreased coordination, decreased endurance, decreased mobility, difficulty walking, decreased ROM, decreased strength, hypomobility, postural dysfunction, and pain.   ACTIVITY LIMITATIONS: carrying, lifting, bending, sitting, standing, squatting, sleeping, stairs, transfers, and bed mobility  PARTICIPATION LIMITATIONS: meal prep, cleaning, laundry, shopping, community activity, and yard work  PERSONAL FACTORS: Age, Time since onset of injury/illness/exacerbation, and 3+ comorbidities: OA, DDD  Previous surgeries,  are also affecting patient's functional outcome.   REHAB POTENTIAL:  Good  CLINICAL DECISION MAKING:  Evolving/moderate complexity  EVALUATION COMPLEXITY: Moderate   GOALS: Goals reviewed with patient? Yes SHORT TERM GOALS: Target date: 10/07/2023  Pt will be independent with HEP in order to improve strength and decrease back pain in order to improve pain-free function at home. Baseline: EVAL= No formal HEP in place Goal status: MET  LONG TERM GOALS: Target date: 11/18/2023  Pt will decrease MODI score by at least 8 points in order demonstrate clinically significant reduction in back pain/disability. Baseline: EVAL = 24; 10/21/23; 26 Goal status: NOT MET, but patient reports he is more successfully managing it.   2.  Pt will decrease worst back pain as reported on NPRS by at least 2 points in order to demonstrate clinically significant reduction in back pain.  Baseline: Worst low back pain=6/10; 10/21/23: 4/10  Goal status: MET  3.  Pt will improve BERG by at least 3 points in order to demonstrate clinically significant improvement in balance.  Baseline: 47/56; 09/20/23: 52/56 Goal status: MET 09/30/23  4.  Pt will improve ABC by at least 13% in order to demonstrate clinically significant improvement in balance confidence Baseline: EVAL= 58.75%; 10/21/23: 86.9% Goal status: MET  5.  Pt will decrease 5TSTS by at least 3 seconds in order to demonstrate clinically significant improvement in LE strength. Baseline: 14.07 sec without UE; 09/30/23: 8.15sec Goal status: MET 09/30/23  6.  Pt will increase by at least 73m (115ft) in order to demonstrate clinically significant improvement in cardiopulmonary endurance and community ambulation  Baseline: 885ft with no AD; 09/30/23: 1025 no device  Goal status: MET 9/29  PLAN:  PT FREQUENCY: 1-2x/week  PT DURATION: 12 weeks  PLANNED INTERVENTIONS: [id number]- PT Re-evaluation, 97750- Physical Performance Testing, 97110-Therapeutic exercises, 97530- Therapeutic activity, 97112- Neuromuscular re-education, 97535- Self Care, [id number]- Manual  therapy, [id number]- Gait training, [id number]- Orthotic Initial, [id number]- Orthotic/Prosthetic subsequent, [id number]- Canalith repositioning, [id number]- Electrical stimulation (manual), [phone] (1-2 muscles), [id number] (3+ muscles)- Dry Needling, Patient/Family education, Balance training, Stair training, Taping, Joint mobilization, Joint manipulation, Spinal manipulation, Spinal mobilization, Vestibular training, DME instructions, Cryotherapy, and Moist heat.  PLAN FOR NEXT SESSION:    Progress and instruct in gym based HEP he can do at his community fitness center   Note: Portions of this document were prepared using Dragon voice recognition software and although reviewed may contain unintentional dictation errors in syntax, grammar, or spelling.  Lonni KATHEE Gainer PT ,DPT Physical Therapist- [facility]    2:11 PM 11/11/23   2:11 PM, 11/11/23

## 2023-11-13 ENCOUNTER — Ambulatory Visit: Admitting: Physical Therapy

## 2023-11-14 ENCOUNTER — Ambulatory Visit: Admitting: Physical Therapy

## 2023-11-18 ENCOUNTER — Ambulatory Visit: Admitting: Physical Therapy

## 2023-11-18 DIAGNOSIS — R2681 Unsteadiness on feet: Secondary | ICD-10-CM

## 2023-11-18 DIAGNOSIS — R2689 Other abnormalities of gait and mobility: Secondary | ICD-10-CM

## 2023-11-18 DIAGNOSIS — R262 Difficulty in walking, not elsewhere classified: Secondary | ICD-10-CM

## 2023-11-18 DIAGNOSIS — R269 Unspecified abnormalities of gait and mobility: Secondary | ICD-10-CM

## 2023-11-18 DIAGNOSIS — M6281 Muscle weakness (generalized): Secondary | ICD-10-CM

## 2023-11-18 DIAGNOSIS — M5459 Other low back pain: Secondary | ICD-10-CM

## 2023-11-18 NOTE — Therapy (Signed)
 OUTPATIENT PHYSICAL THERAPY TREATMENT/ DISCHARGE THERAPY  Patient Name: Carlos Neal. MRN: [id number] DOB:1940/04/15, 83 y.o., male Today's Date: 11/18/2023  END OF SESSION:    PT End of Session - 11/18/23 1325     Visit Number 20    Number of Visits 24    Date for Recertification  11/18/23    Authorization Type [employer]    Progress Note Due on Visit 20    PT Start Time 1317    PT Stop Time 1355    PT Time Calculation (min) 38 min    Equipment Utilized During Treatment Gait belt    Activity Tolerance Patient tolerated treatment well    Behavior During Therapy WFL for tasks assessed/performed               Past Medical History:  Diagnosis Date   Anemia    Asthma    as a child   GERD (gastroesophageal reflux disease)    Hypertension    Osteoarthritis    back and knee   Osteopenia    Osteoporosis    Vitamin D  deficiency    Past Surgical History:  Procedure Laterality Date   BROW LIFT Bilateral 09/23/2020   Procedure: BLEPHAROPTOSIS REPAIR; RESECT EX  BROW PTOSIS REPAIR ECTROPION REPAIR, EXTENSIVE BILATERAL;  Surgeon: Ashley Greig HERO, MD;  Location: [facility];  Service: Ophthalmology;  Laterality: Bilateral;  wants to be later in the schedule   COLONOSCOPY  01/12/2013   COLONOSCOPY N/A 01/05/2022   Procedure: COLONOSCOPY;  Surgeon: Toledo, Ladell POUR, MD;  Location: [facility];  Service: Gastroenterology;  Laterality: N/A;   ESOPHAGOGASTRODUODENOSCOPY (EGD) WITH PROPOFOL  N/A 01/05/2022   Procedure: ESOPHAGOGASTRODUODENOSCOPY (EGD) WITH PROPOFOL ;  Surgeon: Toledo, Ladell POUR, MD;  Location: [facility];  Service: Gastroenterology;  Laterality: N/A;   GIVENS CAPSULE STUDY N/A 01/05/2022   Procedure: GIVENS CAPSULE STUDY;  Surgeon: Toledo, Ladell POUR, MD;  Location: [facility];  Service: Gastroenterology;  Laterality: N/A;   HYDROCELE EXCISION     age 5   INGUINAL HERNIA REPAIR Bilateral    as a child   KNEE ARTHROPLASTY Right 01/30/2021    Procedure: COMPUTER ASSISTED TOTAL KNEE ARTHROPLASTY;  Surgeon: Mardee Lynwood SQUIBB, MD;  Location: [facility];  Service: Orthopedics;  Laterality: Right;   LUMBAR LAMINECTOMY/ DECOMPRESSION WITH MET-RX Right 01/15/2020   Procedure: L2-3 DECOMPRESSION, RIGHT L2-3 FAR LATERAL DISCECTOMY;  Surgeon: Clois Fret, MD;  Location: [facility];  Service: Neurosurgery;  Laterality: Right;  2nd case   TOTAL HIP ARTHROPLASTY Left 12/04/2021   Procedure: TOTAL HIP ARTHROPLASTY;  Surgeon: Mardee Lynwood SQUIBB, MD;  Location: [facility];  Service: Orthopedics;  Laterality: Left;   Patient Active Problem List   Diagnosis Date Noted   Senile purpura 09/05/2023   IDA (iron  deficiency anemia) 01/11/2022   AVM (arteriovenous malformation) 01/11/2022   Symptomatic anemia 01/03/2022   Positive D dimer 01/03/2022   H/O total hip arthroplasty, left 12/04/21 12/04/2021   Chronic left shoulder pain 11/09/2021   Primary osteoarthritis of left hip 08/22/2021   Status post total right knee replacement 01/30/2021   B12 deficiency 03/16/2020   BPH associated with nocturia 03/16/2020   Chronic pain syndrome 12/23/2019   Pharmacologic therapy 12/23/2019   Disorder of skeletal system 12/23/2019   Problems influencing health status 12/23/2019   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HCC) 12/23/2019   Chronic lower extremity pain (1ry area of Pain) (Right) 12/23/2019   Chronic low back pain (2ry area of Pain) (Bilateral) (R>L) w/ sciatica (Right)  12/23/2019   Abnormal MRI, lumbar spine (12/04/2019) 12/23/2019   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) 12/23/2019   Retrolisthesis (T12-L1, L1-2) 12/23/2019   Lumbosacral foraminal stenosis 12/23/2019   Lumbar facet arthropathy 12/23/2019   Osteoarthritis of facet joint of lumbar spine 12/23/2019   Lumbar facet syndrome (Bilateral) 12/23/2019   Wedge compression fracture of T12 vertebra, sequela 12/23/2019   DDD (degenerative disc disease), lumbosacral 12/23/2019   DDD (degenerative disc  disease), cervical 12/23/2019   Cervical facet syndrome (Right) 12/23/2019   Cervicalgia 12/23/2019   Chronic neck pain (Right) 12/23/2019   Essential hypertension 12/20/2019   GERD without esophagitis 12/20/2019   Hereditary hemochromatosis 12/20/2019   Impotence 12/20/2019   Insomnia 12/20/2019   Osteopenia 12/20/2019   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 Alda Carpen  REFERRING PROVIDER: Dr. Alda Carpen  REFERRING DIAG:  [phone] (ICD-10-CM) - Spinal stenosis, lumbar region, without neurogenic claudication  R26.81 (ICD-10-CM) - Unsteadiness on feet    Rationale for Evaluation and Treatment: Rehabilitation  THERAPY DIAG:   No diagnosis found.  ONSET DATE: 2022  SUBJECTIVE:                                                                                                                                                                                           SUBJECTIVE STATEMENT:    Pt reports he has consistently been going to fitness center and it has been going well. Feels more comfortable with equipment.   PERTINENT HISTORY:  Per most recent PCP Visit note on 08/20/2023: Spinal stenosis of lumbar region without neurogenic claudication. Slightly improved but still symptomatic. Patient with unstable gait and lack of strength. Plan to refer to physical therapy for evaluation   PAIN:  Are you having pain? Typicla bilat shoulder pain, no low back pain  PRECAUTIONS: Fall  WEIGHT BEARING RESTRICTIONS: No  FALLS:  Has patient fallen in last 6 months? No  LIVING ENVIRONMENT: Lives with: lives with their spouse Lives in: House/apartment Stairs: Yes: External: 3 steps; can  reach both Has following equipment at home: Single point cane, Walker - 2 wheeled, and Grab bars  OCCUPATION: Retired - 2019.  [employer] and worked at [employer] prior to that.   PLOF: Independent  PATIENT GOALS: Improve my walking  NEXT MD VISIT: Dr. Babara (hematologist)   OBJECTIVE:  Note: Objective measures were completed at evaluation unless otherwise noted.  TREATMENT DATE: 11/18/2023  MODI:    MODI:  Modified Oswestry:     MODIFIED OSWESTRY DISABILITY SCALE    Date:  11/17   1: Pain intensity 4   2. Personal care (washing, dressing, etc.) 1   3. Lifting 3   4. Walking    5. Sitting    6. Standing 1   7. Sleeping    8. Social Life    9. Traveling 2   10. Employment/ Homemaking 2   Total 13 0.26    TE- To improve strength, endurance, mobility, and function of specific targeted muscle groups or improve joint range of motion or improve muscle flexibility  Nustep LE only rolling hills mode x 8 min L 5-9   Leg press 55 # x 15, 3 x 11 at 70#   AMB to [employer] row machine 3x 10 reps with level 1 and cues not to push into shoulder pain  Knee flexion machine 3 x 12 reps at level 4 resistance   Instructed pt in continuing to practice with balance and other activities following discharge to maintain improvements.    PATIENT EDUCATION:  Education details: exercise modification to avoid pain in shoulders.  Person educated: Patient Education method: Explanation, Demonstration, Tactile cues, Verbal cues, and Handouts Education comprehension: verbalized understanding, returned demonstration, verbal cues required, tactile cues required, and needs further education  HOME EXERCISE PROGRAM:  Access Code: [email or web address] URL: [email or web address] Date: 10/28/2023 Prepared by: Lonni Gainer  Exercises - Nustep level 4-5 x 15 minutes   - 1 x daily - 3 x weekly - Full Leg Press  - 1 x daily - 3 x weekly - 3 sets - 10 reps - Single Leg  Press  - 1 x daily - 3 x weekly - 2 sets - 10 reps - Hamstring Curl with Weight Machine  - 1 x daily - 3 x weekly - 3 sets - 10 reps - Knee Extension with Weight Machine  - 1 x daily - 3 x weekly - 3 sets - 10 reps  Access Code: [email or web address] URL: [email or web address] Date: 08/26/2023 Prepared by: Reyes London  Exercises - Seated Lumbar Flexion Stretch  - 1 x daily - 3 sets - 30 hold - Seated Hamstring Stretch  - 1 x daily - 3 sets - 30 hold  Access Code: [email or web address] URL: [email or web address] Date: 08/28/2023 Prepared by: Massie Dollar  Exercises - Supine Lower Trunk Rotation  - 1 x daily - 7 x weekly - 3 sets - 10 reps - Supine Bridge  - 1 x daily - 7 x weekly - 3 sets - 10 reps - Supine Piriformis Stretch with Foot on Ground  - 1 x daily - 7 x weekly - 3 sets - 10 reps - Supine Figure 4 Piriformis Stretch  - 1 x daily - 7 x weekly - 3 sets - 10 reps  ASSESSMENT:  CLINICAL IMPRESSION:    Patient arrived with good motivation for completion of pt activities. Pt to be discharged this date with all initial goals met and progressed Hep in place. Pt happy with progress and all questions answered during session. Pt to be discharged from formal PT at this time.   OBJECTIVE IMPAIRMENTS: Abnormal gait, decreased activity tolerance, decreased balance, decreased coordination, decreased endurance, decreased mobility, difficulty walking, decreased ROM, decreased strength, hypomobility, postural dysfunction, and pain.   ACTIVITY LIMITATIONS: carrying, lifting, bending, sitting, standing, squatting, sleeping, stairs, transfers, and bed mobility  PARTICIPATION LIMITATIONS: meal prep, cleaning, laundry, shopping, community  activity, and yard work  PERSONAL FACTORS: Age, Time since onset of injury/illness/exacerbation, and 3+ comorbidities: OA, DDD  Previous surgeries,  are also affecting patient's functional outcome.   REHAB POTENTIAL: Good  CLINICAL DECISION  MAKING: Evolving/moderate complexity  EVALUATION COMPLEXITY: Moderate   GOALS: Goals reviewed with patient? Yes SHORT TERM GOALS: Target date: 10/07/2023  Pt will be independent with HEP in order to improve strength and decrease back pain in order to improve pain-free function at home. Baseline: EVAL= No formal HEP in place Goal status: MET  LONG TERM GOALS: Target date: 11/18/2023  Pt will decrease MODI score by at least 8 points in order demonstrate clinically significant reduction in back pain/disability. Baseline: EVAL = 24; 10/21/23; 26 11/17:12 Goal status: MET  2.  Pt will decrease worst back pain as reported on NPRS by at least 2 points in order to demonstrate clinically significant reduction in back pain.  Baseline: Worst low back pain=6/10; 10/21/23: 4/10  Goal status: MET  3.  Pt will improve BERG by at least 3 points in order to demonstrate clinically significant improvement in balance.  Baseline: 47/56; 09/20/23: 52/56 Goal status: MET 09/30/23  4.  Pt will improve ABC by at least 13% in order to demonstrate clinically significant improvement in balance confidence Baseline: EVAL= 58.75%; 10/21/23: 86.9% Goal status: MET  5.  Pt will decrease 5TSTS by at least 3 seconds in order to demonstrate clinically significant improvement in LE strength. Baseline: 14.07 sec without UE; 09/30/23: 8.15sec Goal status: MET 09/30/23  6.  Pt will increase by at least 29m (118ft) in order to demonstrate clinically significant improvement in cardiopulmonary endurance and community ambulation  Baseline: 88ft with no AD; 09/30/23: 1025 no device  Goal status: MET 9/29  PLAN:  PT FREQUENCY: 1-2x/week  PT DURATION: 12 weeks  PLANNED INTERVENTIONS: [id number]- PT Re-evaluation, 97750- Physical Performance Testing, 97110-Therapeutic exercises, 97530- Therapeutic activity, 97112- Neuromuscular re-education, 97535- Self Care, [id number]- Manual therapy, [id number]- Gait training, [id number]- Orthotic  Initial, [id number]- Orthotic/Prosthetic subsequent, [phone]- Canalith repositioning, [id number]- Electrical stimulation (manual), [phone] (1-2 muscles), [id number] (3+ muscles)- Dry Needling, Patient/Family education, Balance training, Stair training, Taping, Joint mobilization, Joint manipulation, Spinal manipulation, Spinal mobilization, Vestibular training, DME instructions, Cryotherapy, and Moist heat.  PLAN FOR NEXT SESSION:    D/c today  Note: Portions of this document were prepared using Dragon voice recognition software and although reviewed may contain unintentional dictation errors in syntax, grammar, or spelling.  Lonni KATHEE Gainer PT ,DPT Physical Therapist- [facility]    1:26 PM 11/18/23   1:26 PM, 11/18/23

## 2023-11-22 ENCOUNTER — Ambulatory Visit: Admitting: Physical Therapy

## 2023-11-25 ENCOUNTER — Ambulatory Visit: Admitting: Physical Therapy

## 2023-11-26 ENCOUNTER — Ambulatory Visit

## 2023-12-02 ENCOUNTER — Ambulatory Visit: Admitting: Physical Therapy

## 2023-12-05 ENCOUNTER — Ambulatory Visit: Admitting: Physical Therapy

## 2023-12-06 ENCOUNTER — Inpatient Hospital Stay: Attending: Oncology

## 2023-12-06 DIAGNOSIS — Z79899 Other long term (current) drug therapy: Secondary | ICD-10-CM | POA: Insufficient documentation

## 2023-12-06 DIAGNOSIS — D509 Iron deficiency anemia, unspecified: Secondary | ICD-10-CM | POA: Insufficient documentation

## 2023-12-06 DIAGNOSIS — Q2730 Arteriovenous malformation, site unspecified: Secondary | ICD-10-CM

## 2023-12-06 DIAGNOSIS — Z806 Family history of leukemia: Secondary | ICD-10-CM | POA: Diagnosis not present

## 2023-12-06 DIAGNOSIS — D500 Iron deficiency anemia secondary to blood loss (chronic): Secondary | ICD-10-CM

## 2023-12-06 LAB — CBC (CANCER CENTER ONLY)
HCT: 44.4 % (ref 39.0–52.0)
Hemoglobin: 15.3 g/dL (ref 13.0–17.0)
MCH: 33.9 pg (ref 26.0–34.0)
MCHC: 34.5 g/dL (ref 30.0–36.0)
MCV: 98.4 fL (ref 80.0–100.0)
Platelet Count: 269 K/uL (ref 150–400)
RBC: 4.51 MIL/uL (ref 4.22–5.81)
RDW: 12.9 % (ref 11.5–15.5)
WBC Count: 5.9 K/uL (ref 4.0–10.5)
nRBC: 0 % (ref 0.0–0.2)

## 2023-12-06 LAB — HEPATIC FUNCTION PANEL
ALT: <5 U/L (ref 0–44)
AST: 20 U/L (ref 15–41)
Albumin: 4.3 g/dL (ref 3.5–5.0)
Alkaline Phosphatase: 78 U/L (ref 38–126)
Bilirubin, Direct: 0.3 mg/dL — ABNORMAL HIGH (ref 0.0–0.2)
Indirect Bilirubin: 0.3 mg/dL (ref 0.3–0.9)
Total Bilirubin: 0.6 mg/dL (ref 0.0–1.2)
Total Protein: 6.8 g/dL (ref 6.5–8.1)

## 2023-12-06 LAB — IRON AND TIBC
Iron: 106 ug/dL (ref 45–182)
Saturation Ratios: 38 % (ref 17.9–39.5)
TIBC: 281 ug/dL (ref 250–450)
UIBC: 175 ug/dL

## 2023-12-06 LAB — RETIC PANEL
Immature Retic Fract: 5.9 % (ref 2.3–15.9)
RBC.: 4.54 MIL/uL (ref 4.22–5.81)
Retic Count, Absolute: 74.5 K/uL (ref 19.0–186.0)
Retic Ct Pct: 1.6 % (ref 0.4–3.1)
Reticulocyte Hemoglobin: 37.9 pg (ref >27.9)

## 2023-12-06 LAB — FERRITIN: Ferritin: 45 ng/mL (ref 24–336)

## 2023-12-09 ENCOUNTER — Ambulatory Visit: Admitting: Physical Therapy

## 2023-12-09 ENCOUNTER — Other Ambulatory Visit

## 2023-12-11 ENCOUNTER — Ambulatory Visit: Admitting: Oncology

## 2023-12-11 ENCOUNTER — Ambulatory Visit

## 2023-12-12 ENCOUNTER — Ambulatory Visit

## 2023-12-12 ENCOUNTER — Encounter: Payer: Self-pay | Admitting: Oncology

## 2023-12-12 ENCOUNTER — Inpatient Hospital Stay: Admitting: Oncology

## 2023-12-12 VITALS — BP 147/76 | HR 88 | Temp 98.0°F | Resp 18 | Wt 147.7 lb

## 2023-12-12 DIAGNOSIS — D500 Iron deficiency anemia secondary to blood loss (chronic): Secondary | ICD-10-CM | POA: Diagnosis not present

## 2023-12-12 DIAGNOSIS — Q2730 Arteriovenous malformation, site unspecified: Secondary | ICD-10-CM | POA: Diagnosis not present

## 2023-12-12 DIAGNOSIS — D509 Iron deficiency anemia, unspecified: Secondary | ICD-10-CM | POA: Diagnosis not present

## 2023-12-12 NOTE — Assessment & Plan Note (Signed)
 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 Monitor iron panel, currently no evidence of iron over load.

## 2023-12-12 NOTE — Assessment & Plan Note (Addendum)
 Labs reviewed and discussed with patient Lab Results  Component Value Date   HGB 15.3 12/06/2023   TIBC 281 12/06/2023   IRONPCTSAT 38 12/06/2023   FERRITIN 45 12/06/2023    Hemoglobin has improved.  Hold off Iv venofer  Hold  iron  contained multivitamin supplementation

## 2023-12-12 NOTE — Progress Notes (Signed)
 Hematology/Oncology Progress note [phone] [phone]      CHIEF COMPLAINTS/REASON FOR VISIT:  Iron  deficiency anemia, hemochromatosis.   ASSESSMENT & PLAN:   IDA (iron  deficiency anemia) Labs reviewed and discussed with patient Lab Results  Component Value Date   HGB 15.3 12/06/2023   TIBC 281 12/06/2023   IRONPCTSAT 38 12/06/2023   FERRITIN 45 12/06/2023    Hemoglobin has improved.  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  Previous GI recommendation was reviewed. Recommend observation.      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 Monitor iron  panel, currently no evidence of iron  over load.    Orders Placed This Encounter  Procedures   Hepatic function panel    Standing Status:   Future    Expected Date:   04/11/2024    Expiration Date:   12/11/2024   Follow up in 3 months. All questions were answered. The patient knows to call the clinic with any problems, questions or concerns.  Zelphia Cap, MD, PhD [facility] Hematology Oncology 12/12/2023     HISTORY OF PRESENTING ILLNESS:  Carlos Mena. is a  83 y.o.  male with PMH listed below who was referred to me for anemia Reviewed patient's recent labs that was done.  He was found to have abnormal CBC on 01/11/2022 with a hemoglobin of 7. Patient was recently admitted to the hospital due to acute drop of hemoglobin to 5.2, status post multiple units of PRBC transfusion.  01/03/2022, iron  panel is consistent with severe iron  deficiency.  He recently had hip surgery and was on Lovenox  anticoagulation prophylaxis, in combination use of Celebrex .  Patient underwent upper endoscopy, colonoscopy and capsule study.  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  Patient was discharged on 01/06/2022 with a hemoglobin of  7.4. Patient reports feeling tired and fatigued. He had not noticed any recent bleeding such as epistaxis, hematuria or hematochezia.   Patient reports remote history of hereditary hemochromatosis, diagnosed with 30 years ago with a  iron  level of 3000.  Patient underwent phlebotomy frequently at that point. Since he establish care with Dr. Alla 11 years ago, he has not needed phlebotomy. Reviewed his previous blood work in [employer].  Patient has had decreased ferritin level dated back to at least 2015.    INTERVAL HISTORY Carlos Groh. is a 83 y.o. male who has above history reviewed by me today presents for follow up visit for iron  deficiency anemia. He tolerated IV Venofer  treatments.  Denies any melena, hematuria, hematochezia.  Fatigue is chronic, no change.  + easy bruising, especially on his forearms.    MEDICAL HISTORY:  Past Medical History:  Diagnosis Date   Anemia    Asthma    as a child   GERD (gastroesophageal reflux disease)    Hypertension    Osteoarthritis    back and knee   Osteopenia    Osteoporosis    Vitamin D  deficiency     SURGICAL HISTORY: Past Surgical History:  Procedure Laterality Date   BROW LIFT Bilateral 09/23/2020   Procedure: BLEPHAROPTOSIS REPAIR; RESECT EX  BROW PTOSIS REPAIR ECTROPION REPAIR, EXTENSIVE BILATERAL;  Surgeon: Ashley Greig HERO, MD;  Location: [facility];  Service: Ophthalmology;  Laterality: Bilateral;  wants to be later in the schedule   COLONOSCOPY  01/12/2013   COLONOSCOPY N/A 01/05/2022  Procedure: COLONOSCOPY;  Surgeon: Toledo, Ladell POUR, MD;  Location: [facility];  Service: Gastroenterology;  Laterality: N/A;   ESOPHAGOGASTRODUODENOSCOPY (EGD) WITH PROPOFOL  N/A 01/05/2022   Procedure: ESOPHAGOGASTRODUODENOSCOPY (EGD) WITH PROPOFOL ;  Surgeon: Toledo, Ladell POUR, MD;  Location: [facility];  Service: Gastroenterology;  Laterality: N/A;   GIVENS CAPSULE STUDY N/A 01/05/2022   Procedure: GIVENS CAPSULE  STUDY;  Surgeon: Toledo, Ladell POUR, MD;  Location: [facility];  Service: Gastroenterology;  Laterality: N/A;   HYDROCELE EXCISION     age 53   INGUINAL HERNIA REPAIR Bilateral    as a child   KNEE ARTHROPLASTY Right 01/30/2021   Procedure: COMPUTER ASSISTED TOTAL KNEE ARTHROPLASTY;  Surgeon: Mardee Lynwood SQUIBB, MD;  Location: [facility];  Service: Orthopedics;  Laterality: Right;   LUMBAR LAMINECTOMY/ DECOMPRESSION WITH MET-RX Right 01/15/2020   Procedure: L2-3 DECOMPRESSION, RIGHT L2-3 FAR LATERAL DISCECTOMY;  Surgeon: Clois Fret, MD;  Location: [facility];  Service: Neurosurgery;  Laterality: Right;  2nd case   TOTAL HIP ARTHROPLASTY Left 12/04/2021   Procedure: TOTAL HIP ARTHROPLASTY;  Surgeon: Mardee Lynwood SQUIBB, MD;  Location: [facility];  Service: Orthopedics;  Laterality: Left;    SOCIAL HISTORY: Social History   Socioeconomic History   Marital status: Married    Spouse name: Glenda   Number of children: 1   Years of education: Not on file   Highest education level: Not on file  Occupational History   Not on file  Tobacco Use   Smoking status: Never   Smokeless tobacco: Never  Vaping Use   Vaping status: Never Used  Substance and Sexual Activity   Alcohol  use: Yes    Alcohol /week: 3.0 standard drinks of alcohol     Types: 3 Glasses of wine per week    Comment: occasional   Drug use: Never   Sexual activity: Not on file  Other Topics Concern   Not on file  Social History Narrative   Not on file   Social Drivers of Health   Tobacco Use: Low Risk (12/12/2023)   Patient History    Smoking Tobacco Use: Never    Smokeless Tobacco Use: Never    Passive Exposure: Not on file  Financial Resource Strain: Low Risk  (12/05/2023)   Received from [facility]   Overall Financial Resource Strain (CARDIA)    Difficulty of Paying Living Expenses: Not hard at all  Food Insecurity: No Food Insecurity (12/05/2023)   Received from [facility]    Epic    Within the past 12 months, you worried that your food would run out before you got the money to buy more.: Never true    Within the past 12 months, the food you bought just didn't last and you didn't have money to get more.: Never true  Transportation Needs: No Transportation Needs (12/05/2023)   Received [facility] - Transportation    In the past 12 months, has lack of transportation kept you from medical appointments or from getting medications?: No    Lack of Transportation (Non-Medical): No  Physical Activity: Not on file  Stress: Not on file  Social Connections: Not on file  Intimate Partner Violence: Not At Risk (01/04/2022)   Humiliation, Afraid, Rape, and Kick questionnaire    Fear of Current or Ex-Partner: No    Emotionally Abused: No    Physically Abused: No    Sexually Abused: No  Depression (PHQ2-9): Not on file  Alcohol  Screen: Not on  file  Housing: Low Risk  (12/05/2023)   Received from [facility]   Epic    In the last 12 months, was there a time when you were not able to pay the mortgage or rent on time?: No    In the past 12 months, how many times have you moved where you were living?: 0    At any time in the past 12 months, were you homeless or living in a shelter (including now)?: No  Utilities: Not At Risk (12/05/2023)   Received from [facility]   Epic    In the past 12 months has the electric, gas, oil, or water  company threatened to shut off services in your home?: No  Health Literacy: Not on file    FAMILY HISTORY: Family History  Problem Relation Age of Onset   COPD Mother    Blindness Father    Leukemia Maternal Uncle     ALLERGIES:  has no known allergies.  MEDICATIONS:  Current Outpatient Medications  Medication Sig Dispense Refill   acetaminophen  (TYLENOL ) 500 MG tablet Take 1,000 mg by mouth every 6 (six) hours as needed for moderate pain.     brimonidine  (ALPHAGAN  P)  0.1 % SOLN Place 1 drop into both eyes in the morning and at bedtime. (Patient taking differently: Place 1 drop into both eyes daily.)     CALCIUM  PO Take 600 mg by mouth daily.     Camphor-Menthol -Methyl Sal (SALONPAS) 3.01-07-08 % PTCH Place 1 patch onto the skin daily as needed (pain).     Cholecalciferol  (VITAMIN D3 PO) Take 1 tablet by mouth daily.     diclofenac Sodium (VOLTAREN) 1 % GEL Apply 1 application topically 4 (four) times daily as needed (pain).     finasteride (PROSCAR) 5 MG tablet Take 5 mg by mouth daily.     losartan  (COZAAR ) 25 MG tablet Take 25 mg by mouth at bedtime.     Melatonin 10 MG CAPS Take 10 mg by mouth at bedtime as needed (sleep).     Multiple Vitamins-Minerals (PRESERVISION AREDS 2) CAPS Take 1 capsule by mouth daily.     tamsulosin  (FLOMAX ) 0.4 MG CAPS capsule Take 0.8 mg by mouth.     traZODone  (DESYREL ) 50 MG tablet Take 50 mg by mouth at bedtime as needed for sleep.     vitamin B-12 (CYANOCOBALAMIN ) 100 MCG tablet Take 200 mcg by mouth daily. (Patient taking differently: Take 200 mcg by mouth every other day.)     Zoledronic Acid (RECLAST IV) Inject into the vein. Once per year     carboxymethylcellulose (REFRESH PLUS) 0.5 % SOLN Place 1 drop into both eyes daily as needed (dry eyes).     hydrochlorothiazide  (HYDRODIURIL ) 12.5 MG tablet Take 12.5 mg by mouth daily.     No current facility-administered medications for this visit.    Review of Systems  Constitutional:  Negative for appetite change, chills, fatigue, fever and unexpected weight change.  HENT:   Negative for hearing loss and voice change.   Eyes:  Negative for eye problems and icterus.  Respiratory:  Negative for chest tightness, cough and shortness of breath.   Cardiovascular:  Negative for chest pain and leg swelling.  Gastrointestinal:  Negative for abdominal distention and abdominal pain.  Endocrine: Negative for hot flashes.  Genitourinary:  Negative for difficulty urinating, dysuria and  frequency.   Musculoskeletal:  Positive for arthralgias.  Skin:  Negative for itching and rash.  Neurological:  Negative for light-headedness and numbness.  Hematological:  Negative for adenopathy. Bruises/bleeds easily.  Psychiatric/Behavioral:  Negative for confusion.     PHYSICAL EXAMINATION: ECOG PERFORMANCE STATUS: 1 - Symptomatic but completely ambulatory Vitals:   12/12/23 1345 12/12/23 1352  BP: (!) 148/72 (!) 147/76  Pulse: 88   Resp: 18   Temp: 98 F (36.7 C)    Filed Weights   12/12/23 1345  Weight: 147 lb 11.2 oz (67 kg)    Physical Exam Constitutional:      General: He is not in acute distress. HENT:     Head: Normocephalic and atraumatic.  Eyes:     General: No scleral icterus. Cardiovascular:     Rate and Rhythm: Normal rate.  Pulmonary:     Effort: Pulmonary effort is normal. No respiratory distress.     Breath sounds: No wheezing.  Abdominal:     General: There is no distension.  Musculoskeletal:        General: Normal range of motion.     Cervical back: Normal range of motion.  Skin:    Findings: No rash.  Neurological:     Mental Status: He is alert and oriented to person, place, and time. Mental status is at baseline.  Psychiatric:        Mood and Affect: Mood normal.      LABORATORY DATA:  I have reviewed the data as listed    Latest Ref Rng & Units 12/06/2023    8:41 AM 09/03/2023    8:57 AM 06/03/2023    8:55 AM  CBC  WBC 4.0 - 10.5 K/uL 5.9  6.9  5.4   Hemoglobin 13.0 - 17.0 g/dL 84.6  86.1  89.2   Hematocrit 39.0 - 52.0 % 44.4  40.7  32.9   Platelets 150 - 400 K/uL 269  274  308       Latest Ref Rng & Units 12/06/2023    8:41 AM 06/03/2023    8:55 AM 01/11/2022   10:25 AM  CMP  Glucose 70 - 99 mg/dL   72   BUN 8 - 23 mg/dL   30   Creatinine 9.38 - 1.24 mg/dL   9.15   Sodium 864 - 854 mmol/L   135   Potassium 3.5 - 5.1 mmol/L   3.5   Chloride 98 - 111 mmol/L   103   CO2 22 - 32 mmol/L   26   Calcium  8.9 - 10.3 mg/dL   8.6    Total Protein 6.5 - 8.1 g/dL 6.8  6.3  6.0   Total Bilirubin 0.0 - 1.2 mg/dL 0.6  0.8  0.5   Alkaline Phos 38 - 126 U/L 78  46  48   AST 15 - 41 U/L 20  16  16    ALT 0 - 44 U/L 5  9  6        Component Value Date/Time   IRON  106 12/06/2023 0841   TIBC 281 12/06/2023 0841   FERRITIN 45 12/06/2023 0841   FERRITIN 94 04/16/2011 1406   IRONPCTSAT 38 12/06/2023 0841     RADIOGRAPHIC STUDIES: I have personally reviewed the radiological images as listed and agreed with the findings in the report. No results found.

## 2023-12-12 NOTE — Assessment & Plan Note (Signed)
 chronic intermittent GI blood loss.  Previous GI recommendation was reviewed. Recommend observation.

## 2023-12-13 ENCOUNTER — Ambulatory Visit: Admitting: Physical Therapy

## 2023-12-16 ENCOUNTER — Ambulatory Visit: Admitting: Physical Therapy

## 2023-12-19 ENCOUNTER — Ambulatory Visit: Admitting: Physical Therapy

## 2024-01-23 ENCOUNTER — Ambulatory Visit: Attending: Family Medicine

## 2024-01-23 DIAGNOSIS — R269 Unspecified abnormalities of gait and mobility: Secondary | ICD-10-CM | POA: Insufficient documentation

## 2024-01-23 DIAGNOSIS — R2681 Unsteadiness on feet: Secondary | ICD-10-CM | POA: Insufficient documentation

## 2024-01-23 DIAGNOSIS — R2689 Other abnormalities of gait and mobility: Secondary | ICD-10-CM | POA: Insufficient documentation

## 2024-01-23 DIAGNOSIS — M6281 Muscle weakness (generalized): Secondary | ICD-10-CM | POA: Insufficient documentation

## 2024-01-23 DIAGNOSIS — M542 Cervicalgia: Secondary | ICD-10-CM | POA: Insufficient documentation

## 2024-01-23 DIAGNOSIS — R262 Difficulty in walking, not elsewhere classified: Secondary | ICD-10-CM | POA: Insufficient documentation

## 2024-01-23 DIAGNOSIS — M5459 Other low back pain: Secondary | ICD-10-CM | POA: Insufficient documentation

## 2024-01-23 DIAGNOSIS — R278 Other lack of coordination: Secondary | ICD-10-CM | POA: Insufficient documentation

## 2024-01-23 NOTE — Therapy (Signed)
 " OUTPATIENT PHYSICAL THERAPY NEURO EVALUATION   Patient Name: Carlos Neal. MRN: [id number] DOB:March 31, 1940, 84 y.o., male Today's Date: 01/24/2024   PCP: Dr. Alda Carpen REFERRING PROVIDER: Dr. Alda Carpen  END OF SESSION:  PT End of Session - 01/23/24 1146     Visit Number 1    Number of Visits 24    Date for Recertification  04/16/24    PT Start Time 1105    PT Stop Time 1150    PT Time Calculation (min) 45 min    Equipment Utilized During Treatment Gait belt    Activity Tolerance Patient tolerated treatment well    Behavior During Therapy WFL for tasks assessed/performed          Past Medical History:  Diagnosis Date   Anemia    Asthma    as a child   GERD (gastroesophageal reflux disease)    Hypertension    Osteoarthritis    back and knee   Osteopenia    Osteoporosis    Vitamin D  deficiency    Past Surgical History:  Procedure Laterality Date   BROW LIFT Bilateral 09/23/2020   Procedure: BLEPHAROPTOSIS REPAIR; RESECT EX  BROW PTOSIS REPAIR ECTROPION REPAIR, EXTENSIVE BILATERAL;  Surgeon: Ashley Greig HERO, MD;  Location: [facility];  Service: Ophthalmology;  Laterality: Bilateral;  wants to be later in the schedule   COLONOSCOPY  01/12/2013   COLONOSCOPY N/A 01/05/2022   Procedure: COLONOSCOPY;  Surgeon: Toledo, Ladell POUR, MD;  Location: [facility];  Service: Gastroenterology;  Laterality: N/A;   ESOPHAGOGASTRODUODENOSCOPY (EGD) WITH PROPOFOL  N/A 01/05/2022   Procedure: ESOPHAGOGASTRODUODENOSCOPY (EGD) WITH PROPOFOL ;  Surgeon: Toledo, Ladell POUR, MD;  Location: [facility];  Service: Gastroenterology;  Laterality: N/A;   GIVENS CAPSULE STUDY N/A 01/05/2022   Procedure: GIVENS CAPSULE STUDY;  Surgeon: Toledo, Ladell POUR, MD;  Location: [facility];  Service: Gastroenterology;  Laterality: N/A;   HYDROCELE EXCISION     age 75   INGUINAL HERNIA REPAIR Bilateral    as a child   KNEE ARTHROPLASTY Right 01/30/2021   Procedure: COMPUTER  ASSISTED TOTAL KNEE ARTHROPLASTY;  Surgeon: Mardee Lynwood SQUIBB, MD;  Location: [facility];  Service: Orthopedics;  Laterality: Right;   LUMBAR LAMINECTOMY/ DECOMPRESSION WITH MET-RX Right 01/15/2020   Procedure: L2-3 DECOMPRESSION, RIGHT L2-3 FAR LATERAL DISCECTOMY;  Surgeon: Clois Fret, MD;  Location: [facility];  Service: Neurosurgery;  Laterality: Right;  2nd case   TOTAL HIP ARTHROPLASTY Left 12/04/2021   Procedure: TOTAL HIP ARTHROPLASTY;  Surgeon: Mardee Lynwood SQUIBB, MD;  Location: [facility];  Service: Orthopedics;  Laterality: Left;   Patient Active Problem List   Diagnosis Date Noted   Senile purpura 09/05/2023   IDA (iron  deficiency anemia) 01/11/2022   AVM (arteriovenous malformation) 01/11/2022   Symptomatic anemia 01/03/2022   Positive D dimer 01/03/2022   H/O total hip arthroplasty, left 12/04/21 12/04/2021   Chronic left shoulder pain 11/09/2021   Primary osteoarthritis of left hip 08/22/2021   Status post total right knee replacement 01/30/2021   B12 deficiency 03/16/2020   BPH associated with nocturia 03/16/2020   Chronic pain syndrome 12/23/2019   Pharmacologic therapy 12/23/2019   Disorder of skeletal system 12/23/2019   Problems influencing health status 12/23/2019   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HCC) 12/23/2019   Chronic lower extremity pain (1ry area of Pain) (Right) 12/23/2019   Chronic low back pain (2ry area of Pain) (Bilateral) (R>L) w/ sciatica (Right) 12/23/2019   Abnormal MRI, lumbar spine (12/04/2019) 12/23/2019  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) 12/23/2019   Retrolisthesis (T12-L1, L1-2) 12/23/2019   Lumbosacral foraminal stenosis 12/23/2019   Lumbar facet arthropathy 12/23/2019   Osteoarthritis of facet joint of lumbar spine 12/23/2019   Lumbar facet syndrome (Bilateral) 12/23/2019   Wedge compression fracture of T12 vertebra, sequela 12/23/2019   DDD (degenerative disc disease), lumbosacral 12/23/2019   DDD (degenerative disc disease), cervical  12/23/2019   Cervical facet syndrome (Right) 12/23/2019   Cervicalgia 12/23/2019   Chronic neck pain (Right) 12/23/2019   Essential hypertension 12/20/2019   GERD without esophagitis 12/20/2019   Hereditary hemochromatosis 12/20/2019   Impotence 12/20/2019   Insomnia 12/20/2019   Osteopenia 12/20/2019   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/21/2019   Spondylosis of cervical region without myelopathy or radiculopathy 02/20/2019   Osteopenia of multiple sites 09/11/2017   Encounter for general adult medical examination without abnormal findings 12/01/2015   Vaccine counseling 12/01/2015    ONSET DATE: Late November 2025  REFERRING DIAG: R26.81 (ICD-10-CM) - Unstable gait   THERAPY DIAG:  Abnormality of gait and mobility  Muscle weakness (generalized)  Unsteadiness on feet  Difficulty in walking, not elsewhere classified  Other abnormalities of gait and mobility  Other low back pain  Other lack of coordination  Cervicalgia  Rationale for Evaluation and Treatment: Rehabilitation  SUBJECTIVE:                                                                                                                                                                                             SUBJECTIVE STATEMENT: I had a fall 2 days before Thanksgiving and was pushing an empty trash can from curb and lost my balance and fell- did hit my head but no broken bones. Patient reports he was doing okay after graduating from outpatient PT just a couple weeks prior to that but feels he has lost some ground despite continuing to try to workout and here for help with regaining some lost ground.   Pt accompanied by: self  PERTINENT HISTORY: History of Present Illness Carlos Neal. is an 84 year old male who presents with unstable gait and neck pain following a fall. Info obtained from  Gait instability - Unstable gait ongoing for several weeks - Instability  noticed while using treadmill at fitness center, requiring use of handrails for support - Continues regular exercise including cycling and leg press, but gait remains abnormal - Instability noted during recent courses of prednisone over the past couple of weeks - Considering return to physical therapy  Neck pain - Persistent neck pain since a fall two days before Thanksgiving -  Pain localized to the posterior and lateral neck - Pain worsened by looking up and down - No treatments attempted for neck pain  Recent fall - Fell while moving trash cans two days before Thanksgiving - Struck face, head, and neck during the fall   PAIN:  Are you having pain? No pain at rest in clinic but does endorse some lower back and neck pain- worst in morning and after fall in Novembers- Now more stiff than pain.   PRECAUTIONS: Fall  RED FLAGS: None   WEIGHT BEARING RESTRICTIONS: No  FALLS: Has patient fallen in last 6 months? Yes. Number of falls 1  LIVING ENVIRONMENT: Lives with: lives with their spouse Lives in: House/apartment Stairs: Yes: External: 3 steps; can reach both Has following equipment at home: Single point cane, Walker - 2 wheeled, and Grab bars  PLOF: Independent with household mobility without device and Requires assistive device for independence Works out at a fitness center- 3-4 days- mostly lower body exericses  PATIENT GOALS: Lyla get my gait more normal and not fall  OBJECTIVE:  Note: Objective measures were completed at Evaluation unless otherwise noted.  DIAGNOSTIC FINDINGS: None recent  COGNITION: Overall cognitive status: Within functional limits for tasks assessed   SENSATION: WFL  COORDINATION: Mild delay R LE heel to shin vs. Left  EDEMA:  None noted    POSTURE: rounded shoulders and forward head  LOWER EXTREMITY ROM:     Active  Right Eval Left Eval  Hip flexion    Hip extension    Hip abduction    Hip adduction    Hip internal rotation     Hip external rotation    Knee flexion    Knee extension    Ankle dorsiflexion    Ankle plantarflexion    Ankle inversion    Ankle eversion     (Blank rows = not tested)  LOWER EXTREMITY MMT:    MMT Right Eval Left Eval  Hip flexion 4 4+  Hip extension 4 4+  Hip abduction 4 4+  Hip adduction 4+ 4+  Hip internal rotation 4 4  Hip external rotation 4 4  Knee flexion 4 4+  Knee extension 4 4+  Ankle dorsiflexion 4 5  Ankle plantarflexion    Ankle inversion    Ankle eversion    (Blank rows = not tested)  BED MOBILITY:  Not tested  TRANSFERS: Independent   RAMP:  Not tested  CURB:  Not tested  STAIRS: Not tested GAIT: Findings: Distance walked: approx 100 feet  and Comments: Forward trunk posture; Narrowed BOS, decreased step length and height  FUNCTIONAL TESTS:  5 times sit to stand: 12.39 sec without UE support  Timed up and go (TUG): 14.77 sec  6 minute walk test: To be assessed next visit  10 meter walk test: 10.32 and 10.74 sec = 0.95 m/s Berg Balance Scale:   Total Score 50/56 Total Score:    Total Score:      [facility] PT Assessment - 01/23/24 1134       Berg Balance Test   Sit to Stand Able to stand without using hands and stabilize independently    Standing Unsupported Able to stand safely 2 minutes    Sitting with Back Unsupported but Feet Supported on Floor or Stool Able to sit safely and securely 2 minutes    Stand to Sit Sits safely with minimal use of hands    Transfers Able to transfer safely, minor use of hands  Standing Unsupported with Eyes Closed Able to stand 10 seconds safely    Standing Unsupported with Feet Together Able to place feet together independently and stand 1 minute safely    From Standing, Reach Forward with Outstretched Arm Can reach forward >12 cm safely (5)    From Standing Position, Pick up Object from Floor Able to pick up shoe safely and easily    From Standing Position, Turn to Look Behind Over each Shoulder Turn  sideways only but maintains balance    Turn 360 Degrees Able to turn 360 degrees safely in 4 seconds or less    Standing Unsupported, Alternately Place Feet on Step/Stool Able to stand independently and safely and complete 8 steps in 20 seconds    Standing Unsupported, One Foot in Front Able to plae foot ahead of the other independently and hold 30 seconds    Standing on One Leg Able to lift leg independently and hold equal to or more than 3 seconds    Total Score 50           PATIENT SURVEYS:  ABC- To be completed next visit                                                                                                                              TREATMENT DATE: 01/24/2024 EVALUATION Today- Self care/home management: Discussed balance and posture and fall risk. Discussed some of previous HEP (gym activities)  and instructed him to perform his low back stretching as able.  Will add balance activities next visit.    PATIENT EDUCATION: Education details: Purpose of PT; Plan of care; Review of HEP Person educated: Patient Education method: Explanation Education comprehension: verbalized understanding  HOME EXERCISE PROGRAM: Review of previous HEP from previous episode (gym and low back activities)   GOALS: Goals reviewed with patient? Yes  SHORT TERM GOALS: Target date: 03/05/2024  Pt will be independent with HEP in order to improve strength and balance in order to decrease fall risk and improve function at home and work.  Baseline: EVAL- Patient has former HEP and will benefit from progressive balance HEP Goal status: INITIAL  LONG TERM GOALS: Target date: 04/16/2024  1.  Patient will complete five times sit to stand test in < 15 seconds indicating an increased LE strength and improved balance. Baseline: EVAL= 12.39 sec without UE support (was previously 8.15 sec on 09/30/23) Goal status: INITIAL  2.  Patient will improve ABC score by 9 points   to demonstrate statistically  significant improvement in mobility and quality of life as it relates to their Confidence in balance.  Baseline: EVAL- To be assessed visit 2 Goal status: INITIAL   3.  Patient will increase Berg Balance score by > 6 points to demonstrate decreased fall risk during functional activities. Baseline: EVAL: 50/56 (was 52/56 on 09/20/23) Goal status: INITIAL   4.   Patient will reduce timed up and go to <11 seconds to  reduce fall risk and demonstrate improved transfer/gait ability. Baseline: EVAL= 14.77 sec without AD Goal status: INITIAL  5.   Patient will increase 10 meter walk test to >1.43m/s as to improve gait speed for better community ambulation and to reduce fall risk. Baseline: EVAL= 0.95 m/s Goal status: INITIAL  6.   Patient will increase six minute walk test distance to >1000 for progression to community ambulator and improve gait ability Baseline: EVAL- To be assessed visit #2 Goal status: INITIAL   ASSESSMENT:  CLINICAL IMPRESSION: Patient is a 84 y.o. male who was seen today for physical therapy evaluation and treatment for unstable gait. The patient presents today with impairments in strength, balance, coordination, posture, and gait mechanics that are contributing to functional limitations in household and community mobility and increased risk for falls. These deficits represent a decline from his prior level of independence and are consistent with the referring diagnosis of unstable gait and therapy diagnoses including abnormal gait, generalized weakness, unsteadiness on feet, cervicalgia, low back pain, and impaired coordination. Prognosis is good given the patients prior level of function, motivation, intact cognition, and consistent participation in exercise. The patient is an appropriate candidate for skilled physical therapy services to address impairments, reduce fall risk, restore gait efficiency, improve balance confidence, and facilitate a safe return to prior  functional mobility.  OBJECTIVE IMPAIRMENTS: Abnormal gait, decreased activity tolerance, decreased balance, decreased coordination, decreased endurance, decreased mobility, difficulty walking, decreased strength, postural dysfunction, and pain.   ACTIVITY LIMITATIONS: carrying, lifting, bending, standing, squatting, stairs, and transfers  PARTICIPATION LIMITATIONS: meal prep, cleaning, laundry, shopping, community activity, and yard work  PERSONAL FACTORS: Age and 1-2 comorbidities: HTN, OA are also affecting patient's functional outcome.   REHAB POTENTIAL: Good  CLINICAL DECISION MAKING: Stable/uncomplicated  EVALUATION COMPLEXITY: Low  PLAN:  PT FREQUENCY: 1-2x/week  PT DURATION: 12 weeks  PLANNED INTERVENTIONS: [id number]- PT Re-evaluation, 97750- Physical Performance Testing, 97110-Therapeutic exercises, 97530- Therapeutic activity, [id number]- Neuromuscular re-education, 97535- Self Care, [id number]- Manual therapy, [id number]- Gait training, [id number]- Orthotic Initial, [id number]- Orthotic/Prosthetic subsequent, [phone]- Canalith repositioning, [id number]- Electrical stimulation (manual), [id number]- Ultrasound, [id number] (1-2 muscles), [id number] (3+ muscles)- Dry Needling, Patient/Family education, Stair training, Taping, Joint mobilization, Joint manipulation, Spinal mobilization, Vestibular training, DME instructions, Cryotherapy, and Moist heat  PLAN FOR NEXT SESSION:  -6 min walk test -Possible more dynamic balance testing and goal if appropriate.  -Administer ABC test -Balance interventions and add to HEP as appropriate.   Reyes LOISE London, PT 01/24/2024, 8:13 AM        "

## 2024-01-28 ENCOUNTER — Ambulatory Visit

## 2024-01-28 DIAGNOSIS — R2681 Unsteadiness on feet: Secondary | ICD-10-CM

## 2024-01-28 DIAGNOSIS — R262 Difficulty in walking, not elsewhere classified: Secondary | ICD-10-CM

## 2024-01-28 DIAGNOSIS — M6281 Muscle weakness (generalized): Secondary | ICD-10-CM

## 2024-01-28 DIAGNOSIS — R269 Unspecified abnormalities of gait and mobility: Secondary | ICD-10-CM

## 2024-01-28 NOTE — Therapy (Signed)
 " OUTPATIENT PHYSICAL THERAPY NEURO TREATMENT   Patient Name: Carlos Neal. MRN: [id number] DOB:1940/03/29, 84 y.o., male Today's Date: 01/28/2024   PCP: Dr. Alda Carpen REFERRING PROVIDER: Dr. Alda Carpen  END OF SESSION:  PT End of Session - 01/28/24 1050     Visit Number 2    Number of Visits 24    Date for Recertification  04/16/24    PT Start Time 1058    PT Stop Time 1143    PT Time Calculation (min) 45 min    Equipment Utilized During Treatment Gait belt    Activity Tolerance Patient tolerated treatment well    Behavior During Therapy WFL for tasks assessed/performed           Past Medical History:  Diagnosis Date   Anemia    Asthma    as a child   GERD (gastroesophageal reflux disease)    Hypertension    Osteoarthritis    back and knee   Osteopenia    Osteoporosis    Vitamin D  deficiency    Past Surgical History:  Procedure Laterality Date   BROW LIFT Bilateral 09/23/2020   Procedure: BLEPHAROPTOSIS REPAIR; RESECT EX  BROW PTOSIS REPAIR ECTROPION REPAIR, EXTENSIVE BILATERAL;  Surgeon: Ashley Greig HERO, MD;  Location: [facility];  Service: Ophthalmology;  Laterality: Bilateral;  wants to be later in the schedule   COLONOSCOPY  01/12/2013   COLONOSCOPY N/A 01/05/2022   Procedure: COLONOSCOPY;  Surgeon: Toledo, Ladell POUR, MD;  Location: [facility];  Service: Gastroenterology;  Laterality: N/A;   ESOPHAGOGASTRODUODENOSCOPY (EGD) WITH PROPOFOL  N/A 01/05/2022   Procedure: ESOPHAGOGASTRODUODENOSCOPY (EGD) WITH PROPOFOL ;  Surgeon: Toledo, Ladell POUR, MD;  Location: [facility];  Service: Gastroenterology;  Laterality: N/A;   GIVENS CAPSULE STUDY N/A 01/05/2022   Procedure: GIVENS CAPSULE STUDY;  Surgeon: Toledo, Ladell POUR, MD;  Location: [facility];  Service: Gastroenterology;  Laterality: N/A;   HYDROCELE EXCISION     age 5   INGUINAL HERNIA REPAIR Bilateral    as a child   KNEE ARTHROPLASTY Right 01/30/2021   Procedure: COMPUTER  ASSISTED TOTAL KNEE ARTHROPLASTY;  Surgeon: Mardee Lynwood SQUIBB, MD;  Location: [facility];  Service: Orthopedics;  Laterality: Right;   LUMBAR LAMINECTOMY/ DECOMPRESSION WITH MET-RX Right 01/15/2020   Procedure: L2-3 DECOMPRESSION, RIGHT L2-3 FAR LATERAL DISCECTOMY;  Surgeon: Clois Fret, MD;  Location: [facility];  Service: Neurosurgery;  Laterality: Right;  2nd case   TOTAL HIP ARTHROPLASTY Left 12/04/2021   Procedure: TOTAL HIP ARTHROPLASTY;  Surgeon: Mardee Lynwood SQUIBB, MD;  Location: [facility];  Service: Orthopedics;  Laterality: Left;   Patient Active Problem List   Diagnosis Date Noted   Senile purpura 09/05/2023   IDA (iron  deficiency anemia) 01/11/2022   AVM (arteriovenous malformation) 01/11/2022   Symptomatic anemia 01/03/2022   Positive D dimer 01/03/2022   H/O total hip arthroplasty, left 12/04/21 12/04/2021   Chronic left shoulder pain 11/09/2021   Primary osteoarthritis of left hip 08/22/2021   Status post total right knee replacement 01/30/2021   B12 deficiency 03/16/2020   BPH associated with nocturia 03/16/2020   Chronic pain syndrome 12/23/2019   Pharmacologic therapy 12/23/2019   Disorder of skeletal system 12/23/2019   Problems influencing health status 12/23/2019   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HCC) 12/23/2019   Chronic lower extremity pain (1ry area of Pain) (Right) 12/23/2019   Chronic low back pain (2ry area of Pain) (Bilateral) (R>L) w/ sciatica (Right) 12/23/2019   Abnormal MRI, lumbar spine (12/04/2019) 12/23/2019  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) 12/23/2019   Retrolisthesis (T12-L1, L1-2) 12/23/2019   Lumbosacral foraminal stenosis 12/23/2019   Lumbar facet arthropathy 12/23/2019   Osteoarthritis of facet joint of lumbar spine 12/23/2019   Lumbar facet syndrome (Bilateral) 12/23/2019   Wedge compression fracture of T12 vertebra, sequela 12/23/2019   DDD (degenerative disc disease), lumbosacral 12/23/2019   DDD (degenerative disc disease), cervical  12/23/2019   Cervical facet syndrome (Right) 12/23/2019   Cervicalgia 12/23/2019   Chronic neck pain (Right) 12/23/2019   Essential hypertension 12/20/2019   GERD without esophagitis 12/20/2019   Hereditary hemochromatosis 12/20/2019   Impotence 12/20/2019   Insomnia 12/20/2019   Osteopenia 12/20/2019   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/21/2019   Spondylosis of cervical region without myelopathy or radiculopathy 02/20/2019   Osteopenia of multiple sites 09/11/2017   Encounter for general adult medical examination without abnormal findings 12/01/2015   Vaccine counseling 12/01/2015    ONSET DATE: Late November 2025  REFERRING DIAG: R26.81 (ICD-10-CM) - Unstable gait   THERAPY DIAG:  Abnormality of gait and mobility  Muscle weakness (generalized)  Unsteadiness on feet  Difficulty in walking, not elsewhere classified  Rationale for Evaluation and Treatment: Rehabilitation  SUBJECTIVE:                                                                                                                                                                                             SUBJECTIVE STATEMENT: Patient reports he is feeling stiff today. Has been stuck in the house the past few days.   Pt accompanied by: self  PERTINENT HISTORY: History of Present Illness Carlos Neal. is an 84 year old male who presents with unstable gait and neck pain following a fall. Info obtained from  Gait instability - Unstable gait ongoing for several weeks - Instability noticed while using treadmill at fitness center, requiring use of handrails for support - Continues regular exercise including cycling and leg press, but gait remains abnormal - Instability noted during recent courses of prednisone over the past couple of weeks - Considering return to physical therapy  Neck pain - Persistent neck pain since a fall two days before Thanksgiving - Pain localized  to the posterior and lateral neck - Pain worsened by looking up and down - No treatments attempted for neck pain  Recent fall - Fell while moving trash cans two days before Thanksgiving - Struck face, head, and neck during the fall   PAIN:  Are you having pain? No pain at rest in clinic but does endorse some lower back and neck pain- worst in morning and  after fall in Novembers- Now more stiff than pain.   PRECAUTIONS: Fall  RED FLAGS: None   WEIGHT BEARING RESTRICTIONS: No  FALLS: Has patient fallen in last 6 months? Yes. Number of falls 1  LIVING ENVIRONMENT: Lives with: lives with their spouse Lives in: House/apartment Stairs: Yes: External: 3 steps; can reach both Has following equipment at home: Single point cane, Walker - 2 wheeled, and Grab bars  PLOF: Independent with household mobility without device and Requires assistive device for independence Works out at a fitness center- 3-4 days- mostly lower body exericses  PATIENT GOALS: Lyla get my gait more normal and not fall  OBJECTIVE:  Note: Objective measures were completed at Evaluation unless otherwise noted.  DIAGNOSTIC FINDINGS: None recent  COGNITION: Overall cognitive status: Within functional limits for tasks assessed   SENSATION: WFL  COORDINATION: Mild delay R LE heel to shin vs. Left  EDEMA:  None noted    POSTURE: rounded shoulders and forward head  LOWER EXTREMITY ROM:     Active  Right Eval Left Eval  Hip flexion    Hip extension    Hip abduction    Hip adduction    Hip internal rotation    Hip external rotation    Knee flexion    Knee extension    Ankle dorsiflexion    Ankle plantarflexion    Ankle inversion    Ankle eversion     (Blank rows = not tested)  LOWER EXTREMITY MMT:    MMT Right Eval Left Eval  Hip flexion 4 4+  Hip extension 4 4+  Hip abduction 4 4+  Hip adduction 4+ 4+  Hip internal rotation 4 4  Hip external rotation 4 4  Knee flexion 4 4+  Knee  extension 4 4+  Ankle dorsiflexion 4 5  Ankle plantarflexion    Ankle inversion    Ankle eversion    (Blank rows = not tested)  BED MOBILITY:  Not tested  TRANSFERS: Independent   RAMP:  Not tested  CURB:  Not tested  STAIRS: Not tested GAIT: Findings: Distance walked: approx 100 feet  and Comments: Forward trunk posture; Narrowed BOS, decreased step length and height  FUNCTIONAL TESTS:  5 times sit to stand: 12.39 sec without UE support  Timed up and go (TUG): 14.77 sec  6 minute walk test: To be assessed next visit  10 meter walk test: 10.32 and 10.74 sec = 0.95 m/s Berg Balance Scale:   Total Score 50/56 Total Score:    Total Score:         PATIENT SURVEYS:  ABC- To be completed next visit                                                                                                                              TREATMENT DATE: 01/28/24   6 Min Walk Test:  Instructed patient to ambulate as quickly and as safely as possible for 6  minutes using LRAD. Patient was allowed to take standing rest breaks without stopping the test, but if the patient required a sitting rest break the clock would be stopped and the test would be over.  Results: 960 feet  with CGA. Results indicate that the patient has reduced endurance with ambulation compared to age matched norms.  Age Matched Norms: 35-69 yo M: 50 F: 82, 37-79 yo M: 9 F: 471, 37-89 yo M: 417 F: 392 MDC: 58.21 meters (190.98 feet) or 50 meters (ANPTA Core Set of Outcome Measures for Adults with Neurologic Conditions, 2018)  ABC: 78%   TA- To improve functional movements patterns for everyday tasks  Large step with large arm abduction 10x each LE, step back to starting point  Standing 30 seconds against wall x 3 trials   6 crane step up 10x; next to support bar   6 step, lateral step up/down 15x each LE next to support bar   Heel raises 15x   STS 10x  NMR: To facilitate reeducation of movement, balance,  posture, coordination, and/or proprioception/kinesthetic sense. Standing with CGA next to support surface:  Airex pad: static stand 30 seconds x 2 trials, noticeable trembling of ankles/LE's with fatigue and challenge to maintain stability Airex pad: horizontal head turns 30 seconds scanning room 10x ; cueing for arc of motion  Airex pad: vertical head turns 30 seconds, cueing for arc of motion, noticeable sway with upward gaze increasing demand on ankle righting reaction musculature Airex pad: one foot on 6 step one foot on airex pad, hold position for 30 seconds, switch legs, 2x each LE;    PATIENT EDUCATION: Education details: Purpose of PT; Plan of care; Review of HEP Person educated: Patient Education method: Explanation Education comprehension: verbalized understanding  HOME EXERCISE PROGRAM: Review of previous HEP from previous episode (gym and low back activities)   GOALS: Goals reviewed with patient? Yes  SHORT TERM GOALS: Target date: 03/05/2024  Pt will be independent with HEP in order to improve strength and balance in order to decrease fall risk and improve function at home and work.  Baseline: EVAL- Patient has former HEP and will benefit from progressive balance HEP Goal status: INITIAL  LONG TERM GOALS: Target date: 04/16/2024  1.  Patient will complete five times sit to stand test in < 15 seconds indicating an increased LE strength and improved balance. Baseline: EVAL= 12.39 sec without UE support (was previously 8.15 sec on 09/30/23) Goal status: INITIAL  2.  Patient will improve ABC score by 9 points   to demonstrate statistically significant improvement in mobility and quality of life as it relates to their Confidence in balance.  Baseline: EVAL- To be assessed visit 2 1/27: 78%  Goal status: INITIAL   3.  Patient will increase Berg Balance score by > 6 points to demonstrate decreased fall risk during functional activities. Baseline: EVAL: 50/56 (was 52/56 on  09/20/23) Goal status: INITIAL   4.   Patient will reduce timed up and go to <11 seconds to reduce fall risk and demonstrate improved transfer/gait ability. Baseline: EVAL= 14.77 sec without AD Goal status: INITIAL  5.   Patient will increase 10 meter walk test to >1.80m/s as to improve gait speed for better community ambulation and to reduce fall risk. Baseline: EVAL= 0.95 m/s Goal status: INITIAL  6.   Patient will increase six minute walk test distance to >1000 for progression to community ambulator and improve gait ability Baseline: EVAL- To be assessed visit #2  1/27: 960 Goal status: INITIAL   ASSESSMENT:  CLINICAL IMPRESSION:  Patient performed 6 minute walk test without an AD, ambulating 960 ft with fatigue and increased foot shuffle as patient fatigued. Patient is eager to progress his functional ambulation and mobility with decreased compensatory mechanisms. Patient is challenged with upright posture against a wall that improves with repetition. Arm abduction limited by shoulder pain with step outs. The patient is an appropriate candidate for skilled physical therapy services to address impairments, reduce fall risk, restore gait efficiency, improve balance confidence, and facilitate a safe return to prior functional mobility.  OBJECTIVE IMPAIRMENTS: Abnormal gait, decreased activity tolerance, decreased balance, decreased coordination, decreased endurance, decreased mobility, difficulty walking, decreased strength, postural dysfunction, and pain.   ACTIVITY LIMITATIONS: carrying, lifting, bending, standing, squatting, stairs, and transfers  PARTICIPATION LIMITATIONS: meal prep, cleaning, laundry, shopping, community activity, and yard work  PERSONAL FACTORS: Age and 1-2 comorbidities: HTN, OA are also affecting patient's functional outcome.   REHAB POTENTIAL: Good  CLINICAL DECISION MAKING: Stable/uncomplicated  EVALUATION COMPLEXITY: Low  PLAN:  PT FREQUENCY:  1-2x/week  PT DURATION: 12 weeks  PLANNED INTERVENTIONS: [id number]- PT Re-evaluation, 97750- Physical Performance Testing, 97110-Therapeutic exercises, 97530- Therapeutic activity, [id number]- Neuromuscular re-education, 97535- Self Care, [id number]- Manual therapy, [id number]- Gait training, [id number]- Orthotic Initial, [id number]- Orthotic/Prosthetic subsequent, [phone]- Canalith repositioning, [id number]- Electrical stimulation (manual), [id number]- Ultrasound, [id number] (1-2 muscles), [id number] (3+ muscles)- Dry Needling, Patient/Family education, Stair training, Taping, Joint mobilization, Joint manipulation, Spinal mobilization, Vestibular training, DME instructions, Cryotherapy, and Moist heat  PLAN FOR NEXT SESSION:   -Balance interventions and add to HEP as appropriate.    Avielle Imbert, PT 01/28/2024, 11:44 AM        "

## 2024-01-30 ENCOUNTER — Ambulatory Visit: Admitting: Physical Therapy

## 2024-01-30 NOTE — Therapy (Unsigned)
 " OUTPATIENT PHYSICAL THERAPY NEURO TREATMENT   Patient Name: Carlos Neal. MRN: [id number] DOB:1940-03-12, 84 y.o., male Today's Date: 01/30/2024   PCP: Dr. Alda Carpen REFERRING PROVIDER: Dr. Alda Carpen  END OF SESSION:     Past Medical History:  Diagnosis Date   Anemia    Asthma    as a child   GERD (gastroesophageal reflux disease)    Hypertension    Osteoarthritis    back and knee   Osteopenia    Osteoporosis    Vitamin D  deficiency    Past Surgical History:  Procedure Laterality Date   BROW LIFT Bilateral 09/23/2020   Procedure: BLEPHAROPTOSIS REPAIR; RESECT EX  BROW PTOSIS REPAIR ECTROPION REPAIR, EXTENSIVE BILATERAL;  Surgeon: Ashley Greig HERO, MD;  Location: [facility];  Service: Ophthalmology;  Laterality: Bilateral;  wants to be later in the schedule   COLONOSCOPY  01/12/2013   COLONOSCOPY N/A 01/05/2022   Procedure: COLONOSCOPY;  Surgeon: Toledo, Ladell POUR, MD;  Location: [facility];  Service: Gastroenterology;  Laterality: N/A;   ESOPHAGOGASTRODUODENOSCOPY (EGD) WITH PROPOFOL  N/A 01/05/2022   Procedure: ESOPHAGOGASTRODUODENOSCOPY (EGD) WITH PROPOFOL ;  Surgeon: Toledo, Ladell POUR, MD;  Location: [facility];  Service: Gastroenterology;  Laterality: N/A;   GIVENS CAPSULE STUDY N/A 01/05/2022   Procedure: GIVENS CAPSULE STUDY;  Surgeon: Toledo, Ladell POUR, MD;  Location: [facility];  Service: Gastroenterology;  Laterality: N/A;   HYDROCELE EXCISION     age 4   INGUINAL HERNIA REPAIR Bilateral    as a child   KNEE ARTHROPLASTY Right 01/30/2021   Procedure: COMPUTER ASSISTED TOTAL KNEE ARTHROPLASTY;  Surgeon: Mardee Lynwood SQUIBB, MD;  Location: [facility];  Service: Orthopedics;  Laterality: Right;   LUMBAR LAMINECTOMY/ DECOMPRESSION WITH MET-RX Right 01/15/2020   Procedure: L2-3 DECOMPRESSION, RIGHT L2-3 FAR LATERAL DISCECTOMY;  Surgeon: Clois Fret, MD;  Location: [facility];  Service: Neurosurgery;  Laterality: Right;  2nd case    TOTAL HIP ARTHROPLASTY Left 12/04/2021   Procedure: TOTAL HIP ARTHROPLASTY;  Surgeon: Mardee Lynwood SQUIBB, MD;  Location: [facility];  Service: Orthopedics;  Laterality: Left;   Patient Active Problem List   Diagnosis Date Noted   Senile purpura 09/05/2023   IDA (iron  deficiency anemia) 01/11/2022   AVM (arteriovenous malformation) 01/11/2022   Symptomatic anemia 01/03/2022   Positive D dimer 01/03/2022   H/O total hip arthroplasty, left 12/04/21 12/04/2021   Chronic left shoulder pain 11/09/2021   Primary osteoarthritis of left hip 08/22/2021   Status post total right knee replacement 01/30/2021   B12 deficiency 03/16/2020   BPH associated with nocturia 03/16/2020   Chronic pain syndrome 12/23/2019   Pharmacologic therapy 12/23/2019   Disorder of skeletal system 12/23/2019   Problems influencing health status 12/23/2019   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HCC) 12/23/2019   Chronic lower extremity pain (1ry area of Pain) (Right) 12/23/2019   Chronic low back pain (2ry area of Pain) (Bilateral) (R>L) w/ sciatica (Right) 12/23/2019   Abnormal MRI, lumbar spine (12/04/2019) 12/23/2019   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) 12/23/2019   Retrolisthesis (T12-L1, L1-2) 12/23/2019   Lumbosacral foraminal stenosis 12/23/2019   Lumbar facet arthropathy 12/23/2019   Osteoarthritis of facet joint of lumbar spine 12/23/2019   Lumbar facet syndrome (Bilateral) 12/23/2019   Wedge compression fracture of T12 vertebra, sequela 12/23/2019   DDD (degenerative disc disease), lumbosacral 12/23/2019   DDD (degenerative disc disease), cervical 12/23/2019   Cervical facet syndrome (Right) 12/23/2019   Cervicalgia 12/23/2019   Chronic neck pain (Right) 12/23/2019  Essential hypertension 12/20/2019   GERD without esophagitis 12/20/2019   Hereditary hemochromatosis 12/20/2019   Impotence 12/20/2019   Insomnia 12/20/2019   Osteopenia 12/20/2019   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  Rationale for Evaluation and Treatment: Rehabilitation  SUBJECTIVE:                                                                                                                                                                                             SUBJECTIVE STATEMENT: Patient reports he is feeling stiff today. Has been stuck in the house the past few days.   Pt accompanied by: self  PERTINENT HISTORY: History of Present Illness Carlos Neal. is an 84 year old male who presents with unstable gait and neck pain following a fall. Info obtained from  Gait instability - Unstable gait ongoing for several weeks - Instability noticed while using treadmill at fitness center, requiring use of handrails for support - Continues regular exercise including cycling and leg press, but gait remains abnormal - Instability noted during recent courses of prednisone over the past couple of weeks - Considering return to physical therapy  Neck pain - Persistent neck pain since a fall two days before Thanksgiving - Pain localized to the posterior and lateral neck - Pain worsened by looking up and down - No treatments attempted for neck pain  Recent fall - Fell while moving trash cans two days before Thanksgiving - Struck face, head, and neck during the fall   PAIN:  Are you having pain? No pain at rest in clinic but does endorse some lower back and neck pain- worst in morning and after fall in Novembers- Now more stiff than pain.   PRECAUTIONS: Fall  RED FLAGS: None   WEIGHT BEARING RESTRICTIONS:  No  FALLS: Has patient fallen in last 6 months? Yes. Number of falls 1  LIVING ENVIRONMENT: Lives with: lives with their spouse Lives in: House/apartment Stairs: Yes: External: 3 steps; can reach both Has following equipment at home: Single point cane, Walker - 2 wheeled, and Grab bars  PLOF: Independent with household mobility without device and Requires assistive device for independence Works out at a fitness center- 3-4 days- mostly lower body exericses  PATIENT GOALS: Lyla get my  gait more normal and not fall  OBJECTIVE:  Note: Objective measures were completed at Evaluation unless otherwise noted.  DIAGNOSTIC FINDINGS: None recent  COGNITION: Overall cognitive status: Within functional limits for tasks assessed   SENSATION: WFL  COORDINATION: Mild delay R LE heel to shin vs. Left  EDEMA:  None noted    POSTURE: rounded shoulders and forward head  LOWER EXTREMITY ROM:     Active  Right Eval Left Eval  Hip flexion    Hip extension    Hip abduction    Hip adduction    Hip internal rotation    Hip external rotation    Knee flexion    Knee extension    Ankle dorsiflexion    Ankle plantarflexion    Ankle inversion    Ankle eversion     (Blank rows = not tested)  LOWER EXTREMITY MMT:    MMT Right Eval Left Eval  Hip flexion 4 4+  Hip extension 4 4+  Hip abduction 4 4+  Hip adduction 4+ 4+  Hip internal rotation 4 4  Hip external rotation 4 4  Knee flexion 4 4+  Knee extension 4 4+  Ankle dorsiflexion 4 5  Ankle plantarflexion    Ankle inversion    Ankle eversion    (Blank rows = not tested)  BED MOBILITY:  Not tested  TRANSFERS: Independent   RAMP:  Not tested  CURB:  Not tested  STAIRS: Not tested GAIT: Findings: Distance walked: approx 100 feet  and Comments: Forward trunk posture; Narrowed BOS, decreased step length and height  FUNCTIONAL TESTS:  5 times sit to stand: 12.39 sec without UE support  Timed up and go (TUG):  14.77 sec  6 minute walk test: To be assessed next visit  10 meter walk test: 10.32 and 10.74 sec = 0.95 m/s Berg Balance Scale:   Total Score 50/56 Total Score:    Total Score:         PATIENT SURVEYS:  ABC- To be completed next visit                                                                                                                              TREATMENT DATE: 01/30/24   6 Min Walk Test:  Instructed patient to ambulate as quickly and as safely as possible for 6 minutes using LRAD. Patient was allowed to take standing rest breaks without stopping the test, but if the patient required a sitting rest break the clock would be stopped and the test would be over.  Results: 960 feet  with CGA. Results indicate that the patient has reduced endurance with ambulation compared to age matched norms.  Age Matched Norms: 67-69 yo M: 62 F: 21, 40-79 yo M: 72 F: 471, 46-89 yo M: 417 F: 392 MDC: 58.21 meters (190.98 feet) or 50 meters (ANPTA Core Set of Outcome Measures for Adults with Neurologic Conditions, 2018)  ABC: 78%  TA- To improve functional movements patterns for everyday tasks  Large step with large arm abduction 10x each LE, step back to starting point  Standing 30 seconds against wall x 3 trials   6 crane step up 10x; next to support bar   6 step, lateral step up/down 15x each LE next to support bar   Heel raises 15x   STS 10x  NMR: To facilitate reeducation of movement, balance, posture, coordination, and/or proprioception/kinesthetic sense. Standing with CGA next to support surface:  Airex pad: static stand 30 seconds x 2 trials, noticeable trembling of ankles/LE's with fatigue and challenge to maintain stability Airex pad: horizontal head turns 30 seconds scanning room 10x ; cueing for arc of motion  Airex pad: vertical head turns 30 seconds, cueing for arc of motion, noticeable sway with upward gaze increasing demand on ankle righting reaction  musculature Airex pad: one foot on 6 step one foot on airex pad, hold position for 30 seconds, switch legs, 2x each LE;    PATIENT EDUCATION: Education details: Purpose of PT; Plan of care; Review of HEP Person educated: Patient Education method: Explanation Education comprehension: verbalized understanding  HOME EXERCISE PROGRAM: Review of previous HEP from previous episode (gym and low back activities)   GOALS: Goals reviewed with patient? Yes  SHORT TERM GOALS: Target date: 03/05/2024  Pt will be independent with HEP in order to improve strength and balance in order to decrease fall risk and improve function at home and work.  Baseline: EVAL- Patient has former HEP and will benefit from progressive balance HEP Goal status: INITIAL  LONG TERM GOALS: Target date: 04/16/2024  1.  Patient will complete five times sit to stand test in < 15 seconds indicating an increased LE strength and improved balance. Baseline: EVAL= 12.39 sec without UE support (was previously 8.15 sec on 09/30/23) Goal status: INITIAL  2.  Patient will improve ABC score by 9 points   to demonstrate statistically significant improvement in mobility and quality of life as it relates to their Confidence in balance.  Baseline: EVAL- To be assessed visit 2 1/27: 78%  Goal status: INITIAL   3.  Patient will increase Berg Balance score by > 6 points to demonstrate decreased fall risk during functional activities. Baseline: EVAL: 50/56 (was 52/56 on 09/20/23) Goal status: INITIAL   4.   Patient will reduce timed up and go to <11 seconds to reduce fall risk and demonstrate improved transfer/gait ability. Baseline: EVAL= 14.77 sec without AD Goal status: INITIAL  5.   Patient will increase 10 meter walk test to >1.71m/s as to improve gait speed for better community ambulation and to reduce fall risk. Baseline: EVAL= 0.95 m/s Goal status: INITIAL  6.   Patient will increase six minute walk test distance to >1000  for progression to community ambulator and improve gait ability Baseline: EVAL- To be assessed visit #2 1/27: 960 Goal status: INITIAL   ASSESSMENT:  CLINICAL IMPRESSION:  Patient performed 6 minute walk test without an AD, ambulating 960 ft with fatigue and increased foot shuffle as patient fatigued. Patient is eager to progress his functional ambulation and mobility with decreased compensatory mechanisms. Patient is challenged with upright posture against a wall that improves with repetition. Arm abduction limited by shoulder pain with step outs. The patient is an appropriate candidate for skilled physical therapy services to address impairments, reduce fall risk, restore gait efficiency, improve balance confidence, and facilitate a safe return to prior functional mobility.  OBJECTIVE IMPAIRMENTS:  Abnormal gait, decreased activity tolerance, decreased balance, decreased coordination, decreased endurance, decreased mobility, difficulty walking, decreased strength, postural dysfunction, and pain.   ACTIVITY LIMITATIONS: carrying, lifting, bending, standing, squatting, stairs, and transfers  PARTICIPATION LIMITATIONS: meal prep, cleaning, laundry, shopping, community activity, and yard work  PERSONAL FACTORS: Age and 1-2 comorbidities: HTN, OA are also affecting patient's functional outcome.   REHAB POTENTIAL: Good  CLINICAL DECISION MAKING: Stable/uncomplicated  EVALUATION COMPLEXITY: Low  PLAN:  PT FREQUENCY: 1-2x/week  PT DURATION: 12 weeks  PLANNED INTERVENTIONS: [id number]- PT Re-evaluation, 97750- Physical Performance Testing, 97110-Therapeutic exercises, 97530- Therapeutic activity, [id number]- Neuromuscular re-education, 97535- Self Care, [id number]- Manual therapy, [id number]- Gait training, [id number]- Orthotic Initial, [id number]- Orthotic/Prosthetic subsequent, [phone]- Canalith repositioning, [id number]- Electrical stimulation (manual), [id number]- Ultrasound, [id number] (1-2 muscles), [id number] (3+ muscles)- Dry Needling,  Patient/Family education, Stair training, Taping, Joint mobilization, Joint manipulation, Spinal mobilization, Vestibular training, DME instructions, Cryotherapy, and Moist heat  PLAN FOR NEXT SESSION:   -Balance interventions and add to HEP as appropriate.    Lonni KATHEE Gainer, PT 01/30/2024, 10:16 AM        "

## 2024-02-04 ENCOUNTER — Ambulatory Visit: Admitting: Physical Therapy

## 2024-02-05 ENCOUNTER — Ambulatory Visit: Attending: Family Medicine

## 2024-02-05 DIAGNOSIS — R2689 Other abnormalities of gait and mobility: Secondary | ICD-10-CM

## 2024-02-05 DIAGNOSIS — R269 Unspecified abnormalities of gait and mobility: Secondary | ICD-10-CM

## 2024-02-05 DIAGNOSIS — M6281 Muscle weakness (generalized): Secondary | ICD-10-CM

## 2024-02-05 DIAGNOSIS — R278 Other lack of coordination: Secondary | ICD-10-CM

## 2024-02-05 DIAGNOSIS — M5459 Other low back pain: Secondary | ICD-10-CM

## 2024-02-05 DIAGNOSIS — M542 Cervicalgia: Secondary | ICD-10-CM

## 2024-02-05 DIAGNOSIS — R2681 Unsteadiness on feet: Secondary | ICD-10-CM

## 2024-02-05 DIAGNOSIS — R262 Difficulty in walking, not elsewhere classified: Secondary | ICD-10-CM

## 2024-02-05 NOTE — Therapy (Signed)
 " OUTPATIENT PHYSICAL THERAPY NEURO TREATMENT   Patient Name: Carlos Neal. MRN: [id number] DOB:1940/01/02, 84 y.o., male Today's Date: 02/05/2024   PCP: Dr. Alda Carpen REFERRING PROVIDER: Dr. Alda Carpen  END OF SESSION:  PT End of Session - 02/05/24 1452     Visit Number 3    Number of Visits 24    Date for Recertification  04/16/24    PT Start Time 1448    PT Stop Time 1530    PT Time Calculation (min) 42 min    Equipment Utilized During Treatment Gait belt    Activity Tolerance Patient tolerated treatment well    Behavior During Therapy WFL for tasks assessed/performed            Past Medical History:  Diagnosis Date   Anemia    Asthma    as a child   GERD (gastroesophageal reflux disease)    Hypertension    Osteoarthritis    back and knee   Osteopenia    Osteoporosis    Vitamin D  deficiency    Past Surgical History:  Procedure Laterality Date   BROW LIFT Bilateral 09/23/2020   Procedure: BLEPHAROPTOSIS REPAIR; RESECT EX  BROW PTOSIS REPAIR ECTROPION REPAIR, EXTENSIVE BILATERAL;  Surgeon: Ashley Greig HERO, MD;  Location: [facility];  Service: Ophthalmology;  Laterality: Bilateral;  wants to be later in the schedule   COLONOSCOPY  01/12/2013   COLONOSCOPY N/A 01/05/2022   Procedure: COLONOSCOPY;  Surgeon: Toledo, Ladell POUR, MD;  Location: [facility];  Service: Gastroenterology;  Laterality: N/A;   ESOPHAGOGASTRODUODENOSCOPY (EGD) WITH PROPOFOL  N/A 01/05/2022   Procedure: ESOPHAGOGASTRODUODENOSCOPY (EGD) WITH PROPOFOL ;  Surgeon: Toledo, Ladell POUR, MD;  Location: [facility];  Service: Gastroenterology;  Laterality: N/A;   GIVENS CAPSULE STUDY N/A 01/05/2022   Procedure: GIVENS CAPSULE STUDY;  Surgeon: Toledo, Ladell POUR, MD;  Location: [facility];  Service: Gastroenterology;  Laterality: N/A;   HYDROCELE EXCISION     age 84   INGUINAL HERNIA REPAIR Bilateral    as a child   KNEE ARTHROPLASTY Right 01/30/2021   Procedure: COMPUTER  ASSISTED TOTAL KNEE ARTHROPLASTY;  Surgeon: Mardee Lynwood SQUIBB, MD;  Location: [facility];  Service: Orthopedics;  Laterality: Right;   LUMBAR LAMINECTOMY/ DECOMPRESSION WITH MET-RX Right 01/15/2020   Procedure: L2-3 DECOMPRESSION, RIGHT L2-3 FAR LATERAL DISCECTOMY;  Surgeon: Clois Fret, MD;  Location: [facility];  Service: Neurosurgery;  Laterality: Right;  2nd case   TOTAL HIP ARTHROPLASTY Left 12/04/2021   Procedure: TOTAL HIP ARTHROPLASTY;  Surgeon: Mardee Lynwood SQUIBB, MD;  Location: [facility];  Service: Orthopedics;  Laterality: Left;   Patient Active Problem List   Diagnosis Date Noted   Senile purpura 09/05/2023   IDA (iron  deficiency anemia) 01/11/2022   AVM (arteriovenous malformation) 01/11/2022   Symptomatic anemia 01/03/2022   Positive D dimer 01/03/2022   H/O total hip arthroplasty, left 12/04/21 12/04/2021   Chronic left shoulder pain 11/09/2021   Primary osteoarthritis of left hip 08/22/2021   Status post total right knee replacement 01/30/2021   B12 deficiency 03/16/2020   BPH associated with nocturia 03/16/2020   Chronic pain syndrome 12/23/2019   Pharmacologic therapy 12/23/2019   Disorder of skeletal system 12/23/2019   Problems influencing health status 12/23/2019   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HCC) 12/23/2019   Chronic lower extremity pain (1ry area of Pain) (Right) 12/23/2019   Chronic low back pain (2ry area of Pain) (Bilateral) (R>L) w/ sciatica (Right) 12/23/2019   Abnormal MRI, lumbar spine (12/04/2019) 12/23/2019  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) 12/23/2019   Retrolisthesis (T12-L1, L1-2) 12/23/2019   Lumbosacral foraminal stenosis 12/23/2019   Lumbar facet arthropathy 12/23/2019   Osteoarthritis of facet joint of lumbar spine 12/23/2019   Lumbar facet syndrome (Bilateral) 12/23/2019   Wedge compression fracture of T12 vertebra, sequela 12/23/2019   DDD (degenerative disc disease), lumbosacral 12/23/2019   DDD (degenerative disc disease), cervical  12/23/2019   Cervical facet syndrome (Right) 12/23/2019   Cervicalgia 12/23/2019   Chronic neck pain (Right) 12/23/2019   Essential hypertension 12/20/2019   GERD without esophagitis 12/20/2019   Hereditary hemochromatosis 12/20/2019   Impotence 12/20/2019   Insomnia 12/20/2019   Osteopenia 12/20/2019   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/21/2019   Spondylosis of cervical region without myelopathy or radiculopathy 02/20/2019   Osteopenia of multiple sites 09/11/2017   Encounter for general adult medical examination without abnormal findings 12/01/2015   Vaccine counseling 12/01/2015    ONSET DATE: Late November 2025  REFERRING DIAG: R26.81 (ICD-10-CM) - Unstable gait   THERAPY DIAG:  Abnormality of gait and mobility  Muscle weakness (generalized)  Unsteadiness on feet  Difficulty in walking, not elsewhere classified  Other abnormalities of gait and mobility  Other low back pain  Other lack of coordination  Cervicalgia  Rationale for Evaluation and Treatment: Rehabilitation  SUBJECTIVE:                                                                                                                                                                                             SUBJECTIVE STATEMENT: Patient reports has been limited with exercises due to winter weather/snow.   Pt accompanied by: self  PERTINENT HISTORY: History of Present Illness Carlos Neal. is an 84 year old male who presents with unstable gait and neck pain following a fall. Info obtained from  Gait instability - Unstable gait ongoing for several weeks - Instability noticed while using treadmill at fitness center, requiring use of handrails for support - Continues regular exercise including cycling and leg press, but gait remains abnormal - Instability noted during recent courses of prednisone over the past couple of weeks - Considering return to physical  therapy  Neck pain - Persistent neck pain since a fall two days before Thanksgiving - Pain localized to the posterior and lateral neck - Pain worsened by looking up and down - No treatments attempted for neck pain  Recent fall - Fell while moving trash cans two days before Thanksgiving - Struck face, head, and neck during the fall   PAIN:  Are you having pain? No pain at rest in clinic  but does endorse some lower back and neck pain- worst in morning and after fall in Novembers- Now more stiff than pain.   PRECAUTIONS: Fall  RED FLAGS: None   WEIGHT BEARING RESTRICTIONS: No  FALLS: Has patient fallen in last 6 months? Yes. Number of falls 1  LIVING ENVIRONMENT: Lives with: lives with their spouse Lives in: House/apartment Stairs: Yes: External: 3 steps; can reach both Has following equipment at home: Single point cane, Walker - 2 wheeled, and Grab bars  PLOF: Independent with household mobility without device and Requires assistive device for independence Works out at a fitness center- 3-4 days- mostly lower body exericses  PATIENT GOALS: Lyla get my gait more normal and not fall  OBJECTIVE:  Note: Objective measures were completed at Evaluation unless otherwise noted.  DIAGNOSTIC FINDINGS: None recent  COGNITION: Overall cognitive status: Within functional limits for tasks assessed   SENSATION: WFL  COORDINATION: Mild delay R LE heel to shin vs. Left  EDEMA:  None noted    POSTURE: rounded shoulders and forward head  LOWER EXTREMITY ROM:     Active  Right Eval Left Eval  Hip flexion    Hip extension    Hip abduction    Hip adduction    Hip internal rotation    Hip external rotation    Knee flexion    Knee extension    Ankle dorsiflexion    Ankle plantarflexion    Ankle inversion    Ankle eversion     (Blank rows = not tested)  LOWER EXTREMITY MMT:    MMT Right Eval Left Eval  Hip flexion 4 4+  Hip extension 4 4+  Hip abduction 4 4+   Hip adduction 4+ 4+  Hip internal rotation 4 4  Hip external rotation 4 4  Knee flexion 4 4+  Knee extension 4 4+  Ankle dorsiflexion 4 5  Ankle plantarflexion    Ankle inversion    Ankle eversion    (Blank rows = not tested)  BED MOBILITY:  Not tested  TRANSFERS: Independent   RAMP:  Not tested  CURB:  Not tested  STAIRS: Not tested GAIT: Findings: Distance walked: approx 100 feet  and Comments: Forward trunk posture; Narrowed BOS, decreased step length and height  FUNCTIONAL TESTS:  5 times sit to stand: 12.39 sec without UE support  Timed up and go (TUG): 14.77 sec  6 minute walk test: To be assessed next visit  10 meter walk test: 10.32 and 10.74 sec = 0.95 m/s Berg Balance Scale:   Total Score 50/56 Total Score:    Total Score:         PATIENT SURVEYS:  ABC- To be completed next visit                                                                                                                              TREATMENT DATE: 02/05/24   NMR: To facilitate reeducation of  movement, balance, posture, coordination, and/or proprioception/kinesthetic sense. Standing with CGA next to support surface:  Airex pad: static stand 30 seconds x 2 trials  Airex pad: Dynamic high knee march (unsteady- requiring some UE support) x 20 reps  Standing on firm surface with hip circles (around yoga block)  x 20 reps each LE Blaze pod activities- at wall (3 pods for UE on mirror and 3 on floor approx 3 feet apart) - x 45 sec x 5 rds- hits varied from 11 to 14 hits.  Standing on airex pad (2 cones positioned on stool beside airex pad) - cross body reach and pick up cone and twist back and place cone back down.   Fwd reaching activity- Standing with feet behind foam roll and fwd reaching for cone placed on stool x 20 (progressing the length of forward reach after each 5 reps) -Increased difficulty with increased step (fwd) reaction at times to recover balance.   PATIENT  EDUCATION: Education details: Purpose of PT; Plan of care; Review of HEP Person educated: Patient Education method: Explanation Education comprehension: verbalized understanding  HOME EXERCISE PROGRAM: Review of previous HEP from previous episode (gym and low back activities)   GOALS: Goals reviewed with patient? Yes  SHORT TERM GOALS: Target date: 03/05/2024  Pt will be independent with HEP in order to improve strength and balance in order to decrease fall risk and improve function at home and work.  Baseline: EVAL- Patient has former HEP and will benefit from progressive balance HEP Goal status: INITIAL  LONG TERM GOALS: Target date: 04/16/2024  1.  Patient will complete five times sit to stand test in < 15 seconds indicating an increased LE strength and improved balance. Baseline: EVAL= 12.39 sec without UE support (was previously 8.15 sec on 09/30/23) Goal status: INITIAL  2.  Patient will improve ABC score by 9 points   to demonstrate statistically significant improvement in mobility and quality of life as it relates to their Confidence in balance.  Baseline: EVAL- To be assessed visit 2 1/27: 78%  Goal status: INITIAL   3.  Patient will increase Berg Balance score by > 6 points to demonstrate decreased fall risk during functional activities. Baseline: EVAL: 50/56 (was 52/56 on 09/20/23) Goal status: INITIAL   4.   Patient will reduce timed up and go to <11 seconds to reduce fall risk and demonstrate improved transfer/gait ability. Baseline: EVAL= 14.77 sec without AD Goal status: INITIAL  5.   Patient will increase 10 meter walk test to >1.60m/s as to improve gait speed for better community ambulation and to reduce fall risk. Baseline: EVAL= 0.95 m/s Goal status: INITIAL  6.   Patient will increase six minute walk test distance to >1000 for progression to community ambulator and improve gait ability Baseline: EVAL- To be assessed visit #2 1/27: 960 Goal status: INITIAL    ASSESSMENT:  CLINICAL IMPRESSION: The patient demonstrates persistent balance impairments characterized by limited dynamic control. Increased sway and intermittent need for UE support were observed during dynamic activities on the Airex pad, particularly with high-knee marching and cross-body reach tasks, indicating challenges with both single-limb stability and trunk control. Forward-reach training revealed difficulty maintaining the center of mass within the base of support, with occasional compensatory forward stepping to recover balance as reach distance progressed.  Overall, the patient continues to require CGA during higher-level standing tasks, especially on unstable surfaces, and would benefit from continued neuromuscular reeducation to improve proprioception, coordination, and functional balance necessary for safe  mobility.  OBJECTIVE IMPAIRMENTS: Abnormal gait, decreased activity tolerance, decreased balance, decreased coordination, decreased endurance, decreased mobility, difficulty walking, decreased strength, postural dysfunction, and pain.   ACTIVITY LIMITATIONS: carrying, lifting, bending, standing, squatting, stairs, and transfers  PARTICIPATION LIMITATIONS: meal prep, cleaning, laundry, shopping, community activity, and yard work  PERSONAL FACTORS: Age and 1-2 comorbidities: HTN, OA are also affecting patient's functional outcome.   REHAB POTENTIAL: Good  CLINICAL DECISION MAKING: Stable/uncomplicated  EVALUATION COMPLEXITY: Low  PLAN:  PT FREQUENCY: 1-2x/week  PT DURATION: 12 weeks  PLANNED INTERVENTIONS: [id number]- PT Re-evaluation, 97750- Physical Performance Testing, 97110-Therapeutic exercises, 97530- Therapeutic activity, [id number]- Neuromuscular re-education, 97535- Self Care, [id number]- Manual therapy, [id number]- Gait training, [id number]- Orthotic Initial, [id number]- Orthotic/Prosthetic subsequent, [phone]- Canalith repositioning, [id number]- Electrical stimulation (manual), [id number]- Ultrasound,  [id number] (1-2 muscles), [id number] (3+ muscles)- Dry Needling, Patient/Family education, Stair training, Taping, Joint mobilization, Joint manipulation, Spinal mobilization, Vestibular training, DME instructions, Cryotherapy, and Moist heat  PLAN FOR NEXT SESSION:   -Balance interventions and add to HEP as appropriate.    Reyes LOISE London, PT 02/05/2024, 5:21 PM        "

## 2024-02-07 ENCOUNTER — Ambulatory Visit

## 2024-02-07 DIAGNOSIS — R2689 Other abnormalities of gait and mobility: Secondary | ICD-10-CM

## 2024-02-07 DIAGNOSIS — R262 Difficulty in walking, not elsewhere classified: Secondary | ICD-10-CM

## 2024-02-07 DIAGNOSIS — R269 Unspecified abnormalities of gait and mobility: Secondary | ICD-10-CM

## 2024-02-07 DIAGNOSIS — M6281 Muscle weakness (generalized): Secondary | ICD-10-CM

## 2024-02-07 DIAGNOSIS — R2681 Unsteadiness on feet: Secondary | ICD-10-CM

## 2024-02-07 NOTE — Therapy (Signed)
 " OUTPATIENT PHYSICAL THERAPY TREATMENT   Patient Name: Carlos Neal. MRN: [id number] DOB:[birth year], 84 y.o., male Today's Date: 02/07/2024  PCP: Dr. Alda Neal REFERRING PROVIDER: Dr. Alda Neal  END OF SESSION:  PT End of Session - 02/07/24 1030     Visit Number 4    Number of Visits 24    Date for Recertification  04/16/24    Authorization Type [employer]    Progress Note Due on Visit 20    PT Start Time 1017    PT Stop Time 1057    PT Time Calculation (min) 40 min    Equipment Utilized During Treatment Gait belt    Activity Tolerance Patient tolerated treatment well    Behavior During Therapy WFL for tasks assessed/performed            Past Medical History:  Diagnosis Date   Anemia    Asthma    as a child   GERD (gastroesophageal reflux disease)    Hypertension    Osteoarthritis    back and knee   Osteopenia    Osteoporosis    Vitamin D  deficiency    Past Surgical History:  Procedure Laterality Date   BROW LIFT Bilateral 09/23/2020   Procedure: BLEPHAROPTOSIS REPAIR; RESECT EX  BROW PTOSIS REPAIR ECTROPION REPAIR, EXTENSIVE BILATERAL;  Surgeon: Carlos Neal HERO, MD;  Location: [facility];  Service: Ophthalmology;  Laterality: Bilateral;  wants to be later in the schedule   COLONOSCOPY  01/12/2013   COLONOSCOPY N/A 01/05/2022   Procedure: COLONOSCOPY;  Surgeon: Toledo, Ladell POUR, MD;  Location: [facility];  Service: Gastroenterology;  Laterality: N/A;   ESOPHAGOGASTRODUODENOSCOPY (EGD) WITH PROPOFOL  N/A 01/05/2022   Procedure: ESOPHAGOGASTRODUODENOSCOPY (EGD) WITH PROPOFOL ;  Surgeon: Toledo, Ladell POUR, MD;  Location: [facility];  Service: Gastroenterology;  Laterality: N/A;   GIVENS CAPSULE STUDY N/A 01/05/2022   Procedure: GIVENS CAPSULE STUDY;  Surgeon: Toledo, Ladell POUR, MD;  Location: [facility];  Service: Gastroenterology;  Laterality: N/A;   HYDROCELE EXCISION     age 70   INGUINAL HERNIA REPAIR Bilateral    as a  child   KNEE ARTHROPLASTY Right 01/30/2021   Procedure: COMPUTER ASSISTED TOTAL KNEE ARTHROPLASTY;  Surgeon: Carlos Lynwood SQUIBB, MD;  Location: [facility];  Service: Orthopedics;  Laterality: Right;   LUMBAR LAMINECTOMY/ DECOMPRESSION WITH MET-RX Right 01/15/2020   Procedure: L2-3 DECOMPRESSION, RIGHT L2-3 FAR LATERAL DISCECTOMY;  Surgeon: Carlos Fret, MD;  Location: [facility];  Service: Neurosurgery;  Laterality: Right;  2nd case   TOTAL HIP ARTHROPLASTY Left 12/04/2021   Procedure: TOTAL HIP ARTHROPLASTY;  Surgeon: Carlos Lynwood SQUIBB, MD;  Location: [facility];  Service: Orthopedics;  Laterality: Left;   Patient Active Problem List   Diagnosis Date Noted   Senile purpura 09/05/2023   IDA (iron  deficiency anemia) 01/11/2022   AVM (arteriovenous malformation) 01/11/2022   Symptomatic anemia 01/03/2022   Positive D dimer 01/03/2022   H/O total hip arthroplasty, left 12/04/21 12/04/2021   Chronic left shoulder pain 11/09/2021   Primary osteoarthritis of left hip 08/22/2021   Status post total right knee replacement 01/30/2021   B12 deficiency 03/16/2020   BPH associated with nocturia 03/16/2020   Chronic pain syndrome 12/23/2019   Pharmacologic therapy 12/23/2019   Disorder of skeletal system 12/23/2019   Problems influencing health status 12/23/2019   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HCC) 12/23/2019   Chronic lower extremity pain (1ry area of Pain) (Right) 12/23/2019   Chronic low back pain (2ry area of Pain) (  Bilateral) (R>L) w/ sciatica (Right) 12/23/2019   Abnormal MRI, lumbar spine (12/04/2019) 12/23/2019   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) 12/23/2019   Retrolisthesis (T12-L1, L1-2) 12/23/2019   Lumbosacral foraminal stenosis 12/23/2019   Lumbar facet arthropathy 12/23/2019   Osteoarthritis of facet joint of lumbar spine 12/23/2019   Lumbar facet syndrome (Bilateral) 12/23/2019   Wedge compression fracture of T12 vertebra, sequela 12/23/2019   DDD (degenerative disc disease),  lumbosacral 12/23/2019   DDD (degenerative disc disease), cervical 12/23/2019   Cervical facet syndrome (Right) 12/23/2019   Cervicalgia 12/23/2019   Chronic neck pain (Right) 12/23/2019   Essential hypertension 12/20/2019   GERD without esophagitis 12/20/2019   Hereditary hemochromatosis 12/20/2019   Impotence 12/20/2019   Insomnia 12/20/2019   Osteopenia 12/20/2019   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/21/2019   Spondylosis of cervical region without myelopathy or radiculopathy 02/20/2019   Osteopenia of multiple sites 09/11/2017   Encounter for general adult medical examination without abnormal findings 12/01/2015   Vaccine counseling 12/01/2015    ONSET DATE: Late November 2025  REFERRING DIAG: R26.81 (ICD-10-CM) - Unstable gait   THERAPY DIAG:  Abnormality of gait and mobility  Muscle weakness (generalized)  Unsteadiness on feet  Difficulty in walking, not elsewhere classified  Other abnormalities of gait and mobility  Rationale for Evaluation and Treatment: Rehabilitation  SUBJECTIVE:                                                                                                                                                                                             SUBJECTIVE STATEMENT: Pt doing well today.    Pt accompanied by: self  PERTINENT HISTORY: History of Present Illness [name]. is an 85 year old male who presents with unstable gait and neck pain following a fall. Info obtained from  Gait instability - Unstable gait ongoing for several weeks - Instability noticed while using treadmill at fitness center, requiring use of handrails for support - Continues regular exercise including cycling and leg press, but gait remains abnormal - Instability noted during recent courses of prednisone over the past couple of weeks - Considering return to physical therapy  Neck pain - Persistent neck pain since a fall two  days before Thanksgiving - Pain localized to the posterior and lateral neck - Pain worsened by looking up and down - No treatments attempted for neck pain  Recent fall - Fell while moving trash cans two days before Thanksgiving - Struck face, head, and neck during the fall   PAIN:  Are you having pain? No pain, just stiffness   PRECAUTIONS: Fall  WEIGHT BEARING RESTRICTIONS: No  FALLS: Has patient fallen in last 6 months? Yes. Number of falls 1  LIVING ENVIRONMENT: Lives with: lives with their spouse Lives in: House/apartment Stairs: Yes: External: 3 steps; can reach both Has following equipment at home: Single point cane, Walker - 2 wheeled, and Grab bars  PLOF: Independent with household mobility without device and Requires assistive device for independence Works out at a fitness center- 3-4 days- mostly lower body exericses  PATIENT GOALS: Lyla get my gait more normal and not fall  OBJECTIVE:  Note: Objective measures were completed at Evaluation unless otherwise noted.  DIAGNOSTIC FINDINGS: None recent  COGNITION: Overall cognitive status: Within functional limits for tasks assessed   LOWER EXTREMITY MMT:    MMT Right Eval Left Eval  Hip flexion 4 4+  Hip extension 4 4+  Hip abduction 4 4+  Hip adduction 4+ 4+  Hip internal rotation 4 4  Hip external rotation 4 4  Knee flexion 4 4+  Knee extension 4 4+  Ankle dorsiflexion 4 5  Ankle plantarflexion    Ankle inversion    Ankle eversion    (Blank rows = not tested)  STAIRS: Not tested GAIT: Findings: Distance walked: approx 100 feet  and Comments: Forward trunk posture; Narrowed BOS, decreased step length and height  FUNCTIONAL TESTS:  5 times sit to stand: 12.39 sec without UE support  Timed up and go (TUG): 14.77 sec  6 minute walk test: To be assessed next visit  10 meter walk test: 10.32 and 10.74 sec = 0.95 m/s Berg Balance Scale:   Total Score 50/56 Total Score:    Total Score:     PATIENT  SURVEYS:  ABC-                                                                                                                              TREATMENT DATE: 02/07/24 -AMB overground 1054ft, 1.5lb AW bilat, 77m58sec -1.5lb AW + 12.5 cable resistance at pelvis: 4 laps backward  in // bars, then 3x side stepping bilat  -curb step practice, 2 airex pad step over x20 - red mat navigation in 4 square pattern  - hedge hog dribble across floor  4x48ft  - airex pad stance tennis ball serving   *miGuard assist provided for most of these   PATIENT EDUCATION: Education details: Purpose of PT; Plan of care; Review of HEP Person educated: Patient Education method: Explanation Education comprehension: verbalized understanding  HOME EXERCISE PROGRAM: Review of previous HEP from previous episode (gym and low back activities)   GOALS: Goals reviewed with patient? Yes  SHORT TERM GOALS: Target date: 03/05/2024  Pt will be independent with HEP in order to improve strength and balance in order to decrease fall risk and improve function at home and work.  Baseline: EVAL- Patient has former HEP and will benefit from progressive balance HEP Goal status: INITIAL  LONG TERM GOALS: Target date: 04/16/2024  1.  Patient will complete five times sit to stand test in < 15 seconds indicating an increased LE strength and improved balance. Baseline: EVAL= 12.39 sec without UE support (was previously 8.15 sec on 09/30/23) Goal status: INITIAL  2.  Patient will improve ABC score by 9 points   to demonstrate statistically significant improvement in mobility and quality of life as it relates to their Confidence in balance.  Baseline: EVAL- To be assessed visit 2 1/27: 78%  Goal status: INITIAL   3.  Patient will increase Berg Balance score by > 6 points to demonstrate decreased fall risk during functional activities. Baseline: EVAL: 50/56 (was 52/56 on 09/20/23) Goal status: INITIAL   4.  Patient will reduce timed  up and go to <11 seconds to reduce fall risk and demonstrate improved transfer/gait ability. Baseline: EVAL= 14.77 sec without AD Goal status: INITIAL  5.  Patient will increase 10 meter walk test to >1.77m/s as to improve gait speed for better community ambulation and to reduce fall risk. Baseline: EVAL= 0.95 m/s Goal status: INITIAL  6.  Patient will increase six minute walk test distance to >1000 for progression to community ambulator and improve gait ability Baseline: EVAL- To be assessed visit #2 1/27: 960 Goal status: INITIAL   ASSESSMENT:  CLINICAL IMPRESSION: Continued to work on variable balance actiivty. Encouraged endurance components and hands free. Pt has only a few LOB in session. Overall, the patient continues to require CGA during higher-level standing tasks, especially on unstable surfaces, and would benefit from continued neuromuscular reeducation to improve proprioception, coordination, and functional balance necessary for safe mobility.  OBJECTIVE IMPAIRMENTS: Abnormal gait, decreased activity tolerance, decreased balance, decreased coordination, decreased endurance, decreased mobility, difficulty walking, decreased strength, postural dysfunction, and pain.   ACTIVITY LIMITATIONS: carrying, lifting, bending, standing, squatting, stairs, and transfers  PARTICIPATION LIMITATIONS: meal prep, cleaning, laundry, shopping, community activity, and yard work  PERSONAL FACTORS: Age and 1-2 comorbidities: HTN, OA are also affecting patient's functional outcome.   REHAB POTENTIAL: Good  CLINICAL DECISION MAKING: Stable/uncomplicated  EVALUATION COMPLEXITY: Low  PLAN:  PT FREQUENCY: 1-2x/week  PT DURATION: 12 weeks  PLANNED INTERVENTIONS: [id number]- PT Re-evaluation, 97750- Physical Performance Testing, 97110-Therapeutic exercises, 97530- Therapeutic activity, [id number]- Neuromuscular re-education, 97535- Self Care, [id number]- Manual therapy, [id number]- Gait training, [phone]- Orthotic  Initial, [phone]- Orthotic/Prosthetic subsequent, [phone]- Canalith repositioning, [phone]- Electrical stimulation (manual), [id number]- Ultrasound, [id number] (1-2 muscles), [id number] (3+ muscles)- Dry Needling, Patient/Family education, Stair training, Taping, Joint mobilization, Joint manipulation, Spinal mobilization, Vestibular training, DME instructions, Cryotherapy, and Moist heat  PLAN FOR NEXT SESSION:    10:59 AM, 02/07/24 Peggye JAYSON Linear, PT, DPT Physical Therapist - [facility]  [phone] ([address])     [place] C, PT 02/07/2024, 10:31 AM        "

## 2024-02-14 ENCOUNTER — Ambulatory Visit

## 2024-02-17 ENCOUNTER — Ambulatory Visit

## 2024-02-19 ENCOUNTER — Ambulatory Visit

## 2024-02-21 ENCOUNTER — Ambulatory Visit

## 2024-02-24 ENCOUNTER — Ambulatory Visit

## 2024-02-26 ENCOUNTER — Ambulatory Visit

## 2024-02-28 ENCOUNTER — Ambulatory Visit

## 2024-03-02 ENCOUNTER — Ambulatory Visit

## 2024-03-05 ENCOUNTER — Ambulatory Visit

## 2024-03-09 ENCOUNTER — Ambulatory Visit

## 2024-03-11 ENCOUNTER — Ambulatory Visit

## 2024-03-16 ENCOUNTER — Ambulatory Visit

## 2024-03-18 ENCOUNTER — Ambulatory Visit

## 2024-03-23 ENCOUNTER — Ambulatory Visit

## 2024-03-25 ENCOUNTER — Ambulatory Visit

## 2024-03-30 ENCOUNTER — Ambulatory Visit

## 2024-04-01 ENCOUNTER — Ambulatory Visit

## 2024-04-06 ENCOUNTER — Ambulatory Visit

## 2024-04-08 ENCOUNTER — Ambulatory Visit

## 2024-04-09 ENCOUNTER — Inpatient Hospital Stay

## 2024-04-13 ENCOUNTER — Ambulatory Visit

## 2024-04-15 ENCOUNTER — Ambulatory Visit

## 2024-04-16 ENCOUNTER — Inpatient Hospital Stay

## 2024-04-16 ENCOUNTER — Inpatient Hospital Stay: Admitting: Oncology
